# Patient Record
Sex: Female | Born: 1960 | Race: Black or African American | Hispanic: No | Marital: Single | Smoking: Never smoker
Health system: Southern US, Community
[De-identification: ages and names within clinical notes are randomized; demographics above are authoritative.]

## PROBLEM LIST (undated history)

## (undated) DIAGNOSIS — F329 Major depressive disorder, single episode, unspecified: Secondary | ICD-10-CM

## (undated) DIAGNOSIS — M545 Low back pain, unspecified: Secondary | ICD-10-CM

## (undated) DIAGNOSIS — R0602 Shortness of breath: Secondary | ICD-10-CM

## (undated) DIAGNOSIS — F32A Depression, unspecified: Secondary | ICD-10-CM

## (undated) DIAGNOSIS — I209 Angina pectoris, unspecified: Secondary | ICD-10-CM

## (undated) DIAGNOSIS — E7800 Pure hypercholesterolemia, unspecified: Secondary | ICD-10-CM

## (undated) DIAGNOSIS — R7303 Prediabetes: Secondary | ICD-10-CM

## (undated) DIAGNOSIS — R51 Headache: Secondary | ICD-10-CM

## (undated) DIAGNOSIS — G8929 Other chronic pain: Secondary | ICD-10-CM

## (undated) DIAGNOSIS — R4182 Altered mental status, unspecified: Secondary | ICD-10-CM

## (undated) DIAGNOSIS — M170 Bilateral primary osteoarthritis of knee: Secondary | ICD-10-CM

## (undated) DIAGNOSIS — E119 Type 2 diabetes mellitus without complications: Secondary | ICD-10-CM

## (undated) DIAGNOSIS — I10 Essential (primary) hypertension: Secondary | ICD-10-CM

## (undated) DIAGNOSIS — F419 Anxiety disorder, unspecified: Secondary | ICD-10-CM

## (undated) DIAGNOSIS — K7460 Unspecified cirrhosis of liver: Secondary | ICD-10-CM

## (undated) DIAGNOSIS — N179 Acute kidney failure, unspecified: Secondary | ICD-10-CM

## (undated) DIAGNOSIS — M1990 Unspecified osteoarthritis, unspecified site: Secondary | ICD-10-CM

## (undated) DIAGNOSIS — R0609 Other forms of dyspnea: Secondary | ICD-10-CM

## (undated) DIAGNOSIS — Z9981 Dependence on supplemental oxygen: Secondary | ICD-10-CM

## (undated) DIAGNOSIS — R000 Tachycardia, unspecified: Secondary | ICD-10-CM

## (undated) HISTORY — PX: JOINT REPLACEMENT: SHX530

## (undated) HISTORY — PX: TONSILLECTOMY: SUR1361

## (undated) HISTORY — DX: Tachycardia, unspecified: R00.0

---

## 1980-08-01 HISTORY — PX: TUBAL LIGATION: SHX77

## 2009-08-13 ENCOUNTER — Encounter: Admission: RE | Admit: 2009-08-13 | Discharge: 2009-08-13 | Payer: Self-pay | Admitting: Infectious Diseases

## 2009-08-17 ENCOUNTER — Encounter: Payer: Self-pay | Admitting: Family Medicine

## 2009-08-17 ENCOUNTER — Ambulatory Visit: Payer: Self-pay | Admitting: Family Medicine

## 2009-08-17 DIAGNOSIS — R350 Frequency of micturition: Secondary | ICD-10-CM

## 2009-08-17 DIAGNOSIS — J45909 Unspecified asthma, uncomplicated: Secondary | ICD-10-CM | POA: Insufficient documentation

## 2009-08-17 DIAGNOSIS — M549 Dorsalgia, unspecified: Secondary | ICD-10-CM | POA: Insufficient documentation

## 2009-08-17 LAB — CONVERTED CEMR LAB
Specific Gravity, Urine: 1.01
pH: 5.5

## 2009-08-24 ENCOUNTER — Ambulatory Visit: Payer: Self-pay | Admitting: Family Medicine

## 2009-08-24 DIAGNOSIS — M899 Disorder of bone, unspecified: Secondary | ICD-10-CM | POA: Insufficient documentation

## 2009-08-24 DIAGNOSIS — M5020 Other cervical disc displacement, unspecified cervical region: Secondary | ICD-10-CM

## 2009-08-24 DIAGNOSIS — M949 Disorder of cartilage, unspecified: Secondary | ICD-10-CM

## 2009-08-28 ENCOUNTER — Encounter: Payer: Self-pay | Admitting: *Deleted

## 2009-08-31 ENCOUNTER — Encounter: Payer: Self-pay | Admitting: Family Medicine

## 2009-08-31 ENCOUNTER — Ambulatory Visit: Payer: Self-pay | Admitting: Family Medicine

## 2009-08-31 DIAGNOSIS — I10 Essential (primary) hypertension: Secondary | ICD-10-CM

## 2009-08-31 DIAGNOSIS — M7660 Achilles tendinitis, unspecified leg: Secondary | ICD-10-CM | POA: Insufficient documentation

## 2009-08-31 LAB — CONVERTED CEMR LAB
BUN: 9 mg/dL (ref 6–23)
Calcium: 9.1 mg/dL (ref 8.4–10.5)
Creatinine, Ser: 0.51 mg/dL (ref 0.40–1.20)

## 2009-09-04 ENCOUNTER — Ambulatory Visit: Payer: Self-pay | Admitting: Family Medicine

## 2009-09-08 ENCOUNTER — Encounter: Payer: Self-pay | Admitting: Family Medicine

## 2009-09-08 ENCOUNTER — Encounter: Admission: RE | Admit: 2009-09-08 | Discharge: 2009-11-04 | Payer: Self-pay | Admitting: Family Medicine

## 2009-09-14 ENCOUNTER — Encounter: Payer: Self-pay | Admitting: Family Medicine

## 2009-09-14 ENCOUNTER — Ambulatory Visit: Payer: Self-pay | Admitting: Family Medicine

## 2009-09-14 LAB — CONVERTED CEMR LAB
BUN: 9 mg/dL (ref 6–23)
Chloride: 101 meq/L (ref 96–112)
Potassium: 4.1 meq/L (ref 3.5–5.3)
Sodium: 139 meq/L (ref 135–145)

## 2009-09-15 ENCOUNTER — Telehealth (INDEPENDENT_AMBULATORY_CARE_PROVIDER_SITE_OTHER): Payer: Self-pay | Admitting: *Deleted

## 2009-10-02 ENCOUNTER — Ambulatory Visit: Payer: Self-pay | Admitting: Family Medicine

## 2009-10-05 ENCOUNTER — Encounter: Admission: RE | Admit: 2009-10-05 | Discharge: 2009-11-04 | Payer: Self-pay | Admitting: Family Medicine

## 2009-10-13 ENCOUNTER — Ambulatory Visit: Payer: Self-pay | Admitting: Family Medicine

## 2009-10-13 DIAGNOSIS — M719 Bursopathy, unspecified: Secondary | ICD-10-CM

## 2009-10-13 DIAGNOSIS — M67919 Unspecified disorder of synovium and tendon, unspecified shoulder: Secondary | ICD-10-CM | POA: Insufficient documentation

## 2009-10-30 ENCOUNTER — Ambulatory Visit: Payer: Self-pay | Admitting: Family Medicine

## 2009-11-05 ENCOUNTER — Encounter: Admission: RE | Admit: 2009-11-05 | Discharge: 2009-12-05 | Payer: Self-pay | Admitting: Family Medicine

## 2009-12-09 ENCOUNTER — Ambulatory Visit (HOSPITAL_COMMUNITY): Admission: RE | Admit: 2009-12-09 | Discharge: 2009-12-09 | Payer: Self-pay | Admitting: Family Medicine

## 2009-12-09 ENCOUNTER — Ambulatory Visit: Payer: Self-pay | Admitting: Family Medicine

## 2009-12-09 DIAGNOSIS — N951 Menopausal and female climacteric states: Secondary | ICD-10-CM | POA: Insufficient documentation

## 2009-12-09 DIAGNOSIS — M25539 Pain in unspecified wrist: Secondary | ICD-10-CM | POA: Insufficient documentation

## 2009-12-09 DIAGNOSIS — R079 Chest pain, unspecified: Secondary | ICD-10-CM

## 2009-12-11 ENCOUNTER — Telehealth: Payer: Self-pay | Admitting: *Deleted

## 2009-12-22 ENCOUNTER — Encounter: Admission: RE | Admit: 2009-12-22 | Discharge: 2010-01-27 | Payer: Self-pay | Admitting: Family Medicine

## 2009-12-29 ENCOUNTER — Ambulatory Visit: Payer: Self-pay | Admitting: Family Medicine

## 2009-12-29 DIAGNOSIS — M7040 Prepatellar bursitis, unspecified knee: Secondary | ICD-10-CM | POA: Insufficient documentation

## 2009-12-31 ENCOUNTER — Encounter: Payer: Self-pay | Admitting: Family Medicine

## 2010-01-04 ENCOUNTER — Ambulatory Visit: Payer: Self-pay | Admitting: Family Medicine

## 2010-01-04 DIAGNOSIS — M25569 Pain in unspecified knee: Secondary | ICD-10-CM | POA: Insufficient documentation

## 2010-01-06 ENCOUNTER — Ambulatory Visit (HOSPITAL_COMMUNITY): Admission: RE | Admit: 2010-01-06 | Discharge: 2010-01-06 | Payer: Self-pay | Admitting: Family Medicine

## 2010-01-18 ENCOUNTER — Ambulatory Visit (HOSPITAL_COMMUNITY): Admission: RE | Admit: 2010-01-18 | Discharge: 2010-01-18 | Payer: Self-pay | Admitting: Family Medicine

## 2010-01-18 ENCOUNTER — Ambulatory Visit: Payer: Self-pay | Admitting: Family Medicine

## 2010-01-29 ENCOUNTER — Ambulatory Visit: Payer: Self-pay | Admitting: Family Medicine

## 2010-03-03 ENCOUNTER — Ambulatory Visit: Payer: Self-pay | Admitting: Obstetrics and Gynecology

## 2010-03-03 LAB — CONVERTED CEMR LAB: FSH: 53 milliintl units/mL

## 2010-03-05 ENCOUNTER — Ambulatory Visit: Payer: Self-pay | Admitting: Family Medicine

## 2010-03-05 DIAGNOSIS — M1710 Unilateral primary osteoarthritis, unspecified knee: Secondary | ICD-10-CM

## 2010-03-10 ENCOUNTER — Ambulatory Visit: Payer: Self-pay | Admitting: Obstetrics and Gynecology

## 2010-03-30 ENCOUNTER — Ambulatory Visit: Payer: Self-pay | Admitting: Family Medicine

## 2010-03-31 ENCOUNTER — Telehealth: Payer: Self-pay | Admitting: Family Medicine

## 2010-03-31 ENCOUNTER — Encounter: Payer: Self-pay | Admitting: Family Medicine

## 2010-04-06 ENCOUNTER — Telehealth (INDEPENDENT_AMBULATORY_CARE_PROVIDER_SITE_OTHER): Payer: Self-pay | Admitting: *Deleted

## 2010-04-06 ENCOUNTER — Encounter: Payer: Self-pay | Admitting: Family Medicine

## 2010-04-09 ENCOUNTER — Telehealth: Payer: Self-pay | Admitting: *Deleted

## 2010-04-15 ENCOUNTER — Encounter: Payer: Self-pay | Admitting: Family Medicine

## 2010-05-07 ENCOUNTER — Encounter: Payer: Self-pay | Admitting: Family Medicine

## 2010-05-19 ENCOUNTER — Encounter: Payer: Self-pay | Admitting: Family Medicine

## 2010-06-20 ENCOUNTER — Encounter: Payer: Self-pay | Admitting: Family Medicine

## 2010-06-21 ENCOUNTER — Telehealth: Payer: Self-pay | Admitting: Family Medicine

## 2010-06-29 ENCOUNTER — Encounter (INDEPENDENT_AMBULATORY_CARE_PROVIDER_SITE_OTHER): Payer: Self-pay | Admitting: *Deleted

## 2010-07-22 ENCOUNTER — Ambulatory Visit: Payer: Self-pay | Admitting: Family Medicine

## 2010-07-22 DIAGNOSIS — J309 Allergic rhinitis, unspecified: Secondary | ICD-10-CM | POA: Insufficient documentation

## 2010-08-01 HISTORY — PX: KNEE ARTHROSCOPY: SHX127

## 2010-08-06 ENCOUNTER — Ambulatory Visit
Admission: RE | Admit: 2010-08-06 | Discharge: 2010-08-06 | Payer: Self-pay | Source: Home / Self Care | Attending: Family Medicine | Admitting: Family Medicine

## 2010-08-06 ENCOUNTER — Encounter: Payer: Self-pay | Admitting: Family Medicine

## 2010-08-16 ENCOUNTER — Encounter
Admission: RE | Admit: 2010-08-16 | Discharge: 2010-08-16 | Payer: Self-pay | Source: Home / Self Care | Attending: Family Medicine | Admitting: Family Medicine

## 2010-08-19 ENCOUNTER — Telehealth: Payer: Self-pay | Admitting: *Deleted

## 2010-08-31 NOTE — Assessment & Plan Note (Signed)
Summary: pain in side,tcb   Vital Signs:  Patient profile:   50 year old female Height:      66.5 inches Weight:      281 pounds BMI:     44.84 BSA:     2.33 Temp:     98.1 degrees F Pulse rate:   64 / minute BP sitting:   124 / 85  Vitals Entered By: Jone Baseman CMA (Dec 09, 2009 1:35 PM) CC: pain multiple sites Is Patient Diabetic? No Pain Assessment Patient in pain? yes     Location: all over Intensity: 10   Primary Care Provider:  Ellery Plunk MD  CC:  pain multiple sites.  History of Present Illness: shoulder-  pain significantly improved after injection.    side pain and back pain- chronic since work related injury moving her pt around.  never been imaged.  denies red flags- loss of B or B, any numbness or tingling, any weakness or atrophy.    wrist pain- FOOSH injury 1 month ago.  still hurts, especially over thumb.    post-menapausal bleeding- no periods in 2 years, never had hot flashes before.  now, on 4/4 had full period.  now having hot flashes.  Habits & Providers  Alcohol-Tobacco-Diet     Tobacco Status: never  Current Medications (verified): 1)  Proair Hfa 108 (90 Base) Mcg/act Aers (Albuterol Sulfate) .... Take 2 Puffs Each 4 Hours As Needed For Wheezing 2)  Flonase 50 Mcg/act Susp (Fluticasone Propionate) .... Two Sprays in Each Nostril Once Per Day. 3)  Hydrochlorothiazide 25 Mg Tabs (Hydrochlorothiazide) .... Take One Daily 4)  Diclofenac Sodium 75 Mg Tbec (Diclofenac Sodium) .Marland Kitchen.. 1 By Mouth Bid 5)  Nortriptyline Hcl 25 Mg Caps (Nortriptyline Hcl) .... Take One Tablet At Bedtime 6)  Tramadol Hcl 50 Mg Tabs (Tramadol Hcl) .Marland Kitchen.. 1 By Mouth Bidprn Pain 7)  Vicodin 5-500 Mg Tabs (Hydrocodone-Acetaminophen) .... Take One At Night  Allergies (verified): No Known Drug Allergies  Review of Systems  The patient denies anorexia, fever, weight loss, weight gain, vision loss, decreased hearing, hoarseness, chest pain, syncope, dyspnea on exertion,  peripheral edema, prolonged cough, headaches, hemoptysis, abdominal pain, melena, hematochezia, severe indigestion/heartburn, hematuria, incontinence, genital sores, muscle weakness, suspicious skin lesions, transient blindness, difficulty walking, depression, unusual weight change, abnormal bleeding, enlarged lymph nodes, angioedema, breast masses, and testicular masses.    Physical Exam  General:  overweight-appearing.   Lungs:  Normal respiratory effort, chest expands symmetrically. Lungs are clear to auscultation, no crackles or wheezes. Heart:  Normal rate and regular rhythm. S1 and S2 normal without gallop, murmur, click, rub or other extra sounds. Msk:  full ROM of back, pain worst with leaning back though complains of pulling when leaning forward.  no pain to palpation along spine.    wrist with full ROM pain worst in anatomic snuffbox and posterior thumb   Impression & Recommendations:  Problem # 1:  RIB PAIN, LEFT SIDED (ICD-786.50) Assessment Unchanged likely related to muscle strain secondary to back injury.  Think that PT would be helpful for back pain, better posture. Orders: Physical Therapy Referral (PT) [facility]- Est  Level 4 (16109)  Problem # 2:  BACK PAIN, CHRONIC (ICD-724.5) likely related to muscle strain secondary to back injury.  Think that PT would be helpful for back pain, better posture.  L and S spine films to evaluate for bony abnormalities.  Her updated medication list for this problem includes:    Diclofenac Sodium 75 Mg  Tbec (Diclofenac sodium) .Marland Kitchen... 1 by mouth bid    Tramadol Hcl 50 Mg Tabs (Tramadol hcl) .Marland Kitchen... 1 by mouth bidprn pain    Vicodin 5-500 Mg Tabs (Hydrocodone-acetaminophen) .Marland Kitchen... Take one at night  Orders: Physical Therapy Referral (PT) [facility]- Est  Level 4 (19147) Diagnostic X-Ray/Fluoroscopy (Diagnostic X-Ray/Flu)  Problem # 3:  WRIST PAIN, RIGHT (WGN-562.13) Assessment: New do not think this is likely to be a fx but will get wrist film to  r/o.  think this is more likely to be a tendonitis.  will use thumb splint to imobilize.  likely 6 weeks with splint. ? PT after that? Orders: Physical Therapy Referral (PT) [facility]- Est  Level 4 (99214) [facility] [phone]) Diagnostic X-Ray/Fluoroscopy (Diagnostic X-Ray/Flu)  Problem # 4:  POSTMENOPAUSAL BLEEDING (ICD-627.1) Assessment: New need to evaluate for endometrial ca.  will have pt return for endometrial biopsy.  advised to take 600 of motrin before visit. Orders: [facility]- Est  Level 4 (99214)  Complete Medication List: 1)  Proair Hfa 108 (90 Base) Mcg/act Aers (Albuterol sulfate) .... Take 2 puffs each 4 hours as needed for wheezing 2)  Flonase 50 Mcg/act Susp (Fluticasone propionate) .... Two sprays in each nostril once per day. 3)  Hydrochlorothiazide 25 Mg Tabs (Hydrochlorothiazide) .... Take one daily 4)  Diclofenac Sodium 75 Mg Tbec (Diclofenac sodium) .Marland Kitchen.. 1 by mouth bid 5)  Nortriptyline Hcl 25 Mg Caps (Nortriptyline hcl) .... Take one tablet at bedtime 6)  Tramadol Hcl 50 Mg Tabs (Tramadol hcl) .Marland Kitchen.. 1 by mouth bidprn pain 7)  Vicodin 5-500 Mg Tabs (Hydrocodone-acetaminophen) .... Take one at night  Patient Instructions: 1)  I am glad you are doing better!!!   2)  Please come back to see me for an endometrial biopsy.  That is all we will be able to do that day, but I will be happy to see you at any time to help with any other problem.  Take 800mg  of advil 30 min before your appt to help with cramping. 3)  Go for xrays for your wrist and back.

## 2010-08-31 NOTE — Progress Notes (Signed)
Summary: meds prob  Phone Note Call from Patient Call back at Home Phone [phone]   Caller: Patient Summary of Call: pt has stated that the pharm has sent in requests for her BP meds- pt is now out and needs this am. Initial call taken by: De Nurse,  April 09, 2010 9:31 AM  Follow-up for Phone Call        MD sts that she faxed the paper rx request back this am.  Pt informed. Follow-up by: Jone Baseman CMA,  April 09, 2010 10:49 AM

## 2010-08-31 NOTE — Miscellaneous (Signed)
Summary: Orders Update  Clinical Lists Changes  Orders: Added new Service order of Cervical Foam Collar Non Adjustable (Z6109) - Signed

## 2010-08-31 NOTE — Miscellaneous (Signed)
Summary: new pt info  Clinical Lists Changes   Problems: Added new problem of DIABETES MELLITUS, TYPE II (ICD-250.00) Added new problem of ASTHMA, UNSPECIFIED, UNSPECIFIED STATUS (ICD-493.90) Added new problem of BACK PAIN, CHRONIC (ICD-724.5) Observations: Added new observation of FAMILY HX: Diabetes in parents and sisters (08/17/2009 8:48) Added new observation of SOCIAL HX: lives with daughter Ivor Costa and Doniel and with a Friend named Economist.  Has a dog.  Doesn't endorse a healthy diet, unemployed.  completed college. divorced.  heterosexual.  Nonsmoker.  Drinks one beer/day.  Denies any exercise. (08/17/2009 8:48) Added new observation of PAST SURG HX: BTL in 1982 (08/17/2009 8:48) Added new observation of PAST MED HX: chronic back pain, asthma (08/17/2009 8:48)     Past History:  Past Medical History: chronic back pain, asthma  Past Surgical History: BTL in 1982   Family History: Diabetes in parents and sisters  Social History: lives with daughter Ivor Costa and Doniel and with a Friend named Economist.  Has a dog.  Doesn't endorse a healthy diet, unemployed.  completed college. divorced.  heterosexual.  Nonsmoker.  Drinks one beer/day.  Denies any exercise.  Appended Document: new pt info        Appended Document: new pt info additional phone number for pt is [phone]

## 2010-08-31 NOTE — Assessment & Plan Note (Signed)
Summary: ENDO BX PER DR. Cyle Kenyon/[facility]   Vital Signs:  Patient profile:   50 year old female Height:      66.5 inches Weight:      278.1 pounds BMI:     44.37 Temp:     98.5 degrees F oral Pulse rate:   125 / minute BP sitting:   116 / 82  (left arm) Cuff size:   large  Vitals Entered By: Gladstone Pih (Dec 29, 2009 1:55 PM) CC: Endo BX Is Patient Diabetic? No Pain Assessment Patient in pain? no        Primary Care Provider:  Ellery Plunk MD  CC:  Endo BX.  History of Present Illness: postmenapausal bleeding- see previous note  knee pain- lasting several weeks, due to injury while dancing  wrist pain-  helped a little by the thumb splint, not much.  would like [place] to stop her PT for a while  endometrial biopsy- sterile speculum used to locate cervix, betadine swabs x 3 around cervix, tinaculum placed on anterior lip with little bleeding.  Sound was advanced but did not pass through cervix due to stenosis.  procedure stopped.  Habits & Providers  Alcohol-Tobacco-Diet     Tobacco Status: never  Current Medications (verified): 1)  Proair Hfa 108 (90 Base) Mcg/act Aers (Albuterol Sulfate) .... Take 2 Puffs Each 4 Hours As Needed For Wheezing 2)  Flonase 50 Mcg/act Susp (Fluticasone Propionate) .... Two Sprays in Each Nostril Once Per Day. 3)  Hydrochlorothiazide 25 Mg Tabs (Hydrochlorothiazide) .... Take One Daily 4)  Diclofenac Sodium 75 Mg Tbec (Diclofenac Sodium) .Marland Kitchen.. 1 By Mouth Bid 5)  Nortriptyline Hcl 25 Mg Caps (Nortriptyline Hcl) .... Take One Tablet At Bedtime 6)  Tramadol Hcl 50 Mg Tabs (Tramadol Hcl) .Marland Kitchen.. 1 By Mouth Bidprn Pain 7)  Vicodin 5-500 Mg Tabs (Hydrocodone-Acetaminophen) .... Take One At Night  Allergies (verified): No Known Drug Allergies  Review of Systems  The patient denies anorexia, fever, weight loss, weight gain, vision loss, decreased hearing, hoarseness, chest pain, syncope, dyspnea on exertion, peripheral edema, prolonged cough,  headaches, hemoptysis, abdominal pain, melena, hematochezia, severe indigestion/heartburn, hematuria, incontinence, genital sores, muscle weakness, suspicious skin lesions, transient blindness, difficulty walking, depression, unusual weight change, abnormal bleeding, enlarged lymph nodes, angioedema, breast masses, and testicular masses.    Physical Exam  General:  Well-developed,well-nourished,in no acute distress; alert,appropriate and cooperative throughout examination Genitalia:  cervical os stenotic.  unable to pass sounds Msk:  prepatellar bursa swollen on injured knee and very tender.   Impression & Recommendations:  Problem # 1:  POSTMENOPAUSAL BLEEDING (ICD-627.1) Assessment Unchanged unable to preform biopsy due to stenotic cervix.  will order [place] and send to gyn clinic for dilitation and biopsy. Orders: Ultrasound (Ultrasound) Endometrial/Endocerv BX- [facility] ([id number]) [facility]- Est Level  3 (08657)  Problem # 2:  WRIST PAIN, RIGHT (ICD-719.43) still having wrist pain.  xray showed small chip.  discussed with Dr. Jennette Kettle who thought an injection might help.  Will hold off PT for now until a little better.  switch to a long brace to see if any improvement. Orders: Brace, wrist- [facility] ([id number]) [facility]- Est Level  3 (29528)  Problem # 3:  PREPATELLAR BURSITIS (ICD-726.65) Assessment: New injected bursa today with lidocaine and kenolog.  hopefully with improve.   Orders: Injection, large joint- [facility] (20610) [facility]- Est Level  3 (41324)  Complete Medication List: 1)  Proair Hfa 108 (90 Base) Mcg/act Aers (Albuterol sulfate) .... Take 2 puffs each 4  hours as needed for wheezing 2)  Flonase 50 Mcg/act Susp (Fluticasone propionate) .... Two sprays in each nostril once per day. 3)  Hydrochlorothiazide 25 Mg Tabs (Hydrochlorothiazide) .... Take one daily 4)  Diclofenac Sodium 75 Mg Tbec (Diclofenac sodium) .Marland Kitchen.. 1 by mouth bid 5)  Nortriptyline Hcl 25 Mg Caps (Nortriptyline hcl) .... Take one tablet at  bedtime 6)  Tramadol Hcl 50 Mg Tabs (Tramadol hcl) .Marland Kitchen.. 1 by mouth bidprn pain 7)  Vicodin 5-500 Mg Tabs (Hydrocodone-acetaminophen) .... Take one at night  Other Orders: Gynecologic Referral (Gyn)  Patient Instructions: 1)  please go to sports medicine clinc on friday with Dr. Jennette Kettle 2)  use the long arm brace to see if that helps 3)  go to your gynecology appt to evaluate your bleeding 4)  have fun at the wedding

## 2010-08-31 NOTE — Progress Notes (Signed)
  Phone Note Refill Request Message from:  Fax from Pharmacy  Refills Requested: Medication #1:  HYDROCODONE-ACETAMINOPHEN 10-325 MG TABS 2 by mouth q 6 hrs as needed pain. Refilled Vicodin with no refils   Method Requested: Fax to Local Pharmacy Initial call taken by: Clementeen Graham MD,  June 21, 2010 2:39 PM

## 2010-08-31 NOTE — Assessment & Plan Note (Signed)
Summary: f/u,df   Vital Signs:  Patient profile:   50 year old female Height:      66.5 inches Weight:      274.1 pounds BMI:     43.74 Temp:     97.8 degrees F oral Pulse rate:   93 / minute BP sitting:   157 / 87  (left arm) Cuff size:   regular  Vitals Entered By: Garen Grams LPN (August 31, 2009 8:41 AM) CC: f/u pain Is Patient Diabetic? No Pain Assessment Patient in pain? yes     Location: left side/rt shoulder   Primary Care Provider:  Ellery Plunk MD  CC:  f/u pain.  History of Present Illness: heel pain-started 4 months ago.  Worse in morning and gets better throughout day.  Describes as pain and stiffness.  Denies injury to area, denies weakness or numbness.  Has tried icing twice with no effect.  HTN- has been noted to be hypertensive on last two visits.  denies blurry vision, HA, CP.  Has never been on a antihypertensive.    Habits & Providers  Alcohol-Tobacco-Diet     Tobacco Status: never  Current Medications (verified): 1)  Gabapentin 800 Mg Tabs (Gabapentin) .... Take One Three Times Per Day 2)  Proair Hfa 108 (90 Base) Mcg/act Aers (Albuterol Sulfate) .... Take 2 Puffs Each 4 Hours As Needed For Wheezing 3)  Amitriptyline Hcl 25 Mg Tabs (Amitriptyline Hcl) .... Take One At Bedtime As Needed 4)  Flonase 50 Mcg/act Susp (Fluticasone Propionate) .... Two Sprays in Each Nostril Once Per Day. 5)  Allegra 180 Mg Tabs (Fexofenadine Hcl) .... Take One Daily 6)  Hydrochlorothiazide 25 Mg Tabs (Hydrochlorothiazide) .... Take One Daily  Allergies (verified): No Known Drug Allergies  Review of Systems  The patient denies anorexia, fever, vision loss, hoarseness, chest pain, dyspnea on exertion, and prolonged cough.    Physical Exam  General:  alert and underweight appearing.   Lungs:  Normal respiratory effort, chest expands symmetrically. Lungs are clear to auscultation, no crackles or wheezes. Heart:  Normal rate and regular rhythm. S1 and S2 normal  without gallop, murmur, click, rub or other extra sounds. Extremities:  edema to mid shin bilaterally-1+   Foot/Ankle Exam  Gait:    antalgic.    Skin:    Intact with no scars, lesions, rashes, cafe-au-lait spots or bruising.    Inspection:    Inspection reveals no deformity, ecchymosis or swelling.   Palpation:    tenderness on Left heel at the insertion on the achilles tendon.  no tenderness in calf or along tendon  Sensory:    gross sensation intact bilaterally in lower extremities.    Motor:    Motor strength 5/5 bilaterally for ankle dorsiflexion, ankle plantar flexion, ankle inversion and ankle eversion.    Reflexes:    Normal and symmetric Achilles reflexes bilaterally.    Ankle Exam:    Right:    Inspection:  Normal    Left:    Palpation:  Abnormal       Location:  achilles tendon insertion    Stability:  stable    Tenderness:  yes    Swelling:  no    Erythema:  no    full ROM   Impression & Recommendations:  Problem # 1:  ACHILLES BURSITIS OR TENDINITIS (ICD-726.71) Assessment New I think that her pain sounds like tendinitis so I will refer to Dr. Jennette Kettle at sports medicine for evaluation and treatment.  Asked patient  to continue ice in the interim.   Orders: [facility]- Est Level  3 (16109)  Problem # 2:  ESSENTIAL HYPERTENSION, BENIGN (ICD-401.1) Assessment: New Discussed diet and exercise modifications.  will continue to reenforce with pt at each visit.  starting HCTZ, which will hopefully help swelling as well.  Checking BMET today.  nurse visit and lab draw in 2 weeks.  follow up with me in clinic 3 months Her updated medication list for this problem includes:    Hydrochlorothiazide 25 Mg Tabs (Hydrochlorothiazide) .Marland Kitchen... Take one daily  Orders: Basic Met-[facility] [phone]) [facility]- Est Level  3 (99213)Future Orders: Basic Met-[facility] ([id number]) ... 08/12/2010  Complete Medication List: 1)  Gabapentin 800 Mg Tabs (Gabapentin) .... Take one three times per  day 2)  Proair Hfa 108 (90 Base) Mcg/act Aers (Albuterol sulfate) .... Take 2 puffs each 4 hours as needed for wheezing 3)  Amitriptyline Hcl 25 Mg Tabs (Amitriptyline hcl) .... Take one at bedtime as needed 4)  Flonase 50 Mcg/act Susp (Fluticasone propionate) .... Two sprays in each nostril once per day. 5)  Allegra 180 Mg Tabs (Fexofenadine hcl) .... Take one daily 6)  Hydrochlorothiazide 25 Mg Tabs (Hydrochlorothiazide) .... Take one daily  Patient Instructions: 1)  Please make an appt for sports medicine with Dr. Jennette Kettle to look at your foot 2)  Please make a nurse appt to get your blood pressure checked in 2 weeks and to have blood work done. 3)  Make an appt to see me in 3 months 4)  You decided to go to the gym at least once per week. 5)  increase your fruit and veggies to 5/ day and try to reduce portions and limit packaged snack foods. 6)  We started a medicine called HCTZ for your blood pressure today.  This should also help your swelling. Prescriptions: HYDROCHLOROTHIAZIDE 25 MG TABS (HYDROCHLOROTHIAZIDE) take one daily  #30 x 3   Entered and Authorized by:   Ellery Plunk MD   Signed by:   Ellery Plunk MD on 08/31/2009   Method used:   Faxed to ...       [facility] (retail)       [address] [place], [place]  [address]       Ph: [phone]       Fax: [phone]   RxID:   [id number]

## 2010-08-31 NOTE — Letter (Signed)
Summary: Disability Determination Services  Disability Determination Services   Imported By: Marily Memos 04/19/2010 13:52:26  _____________________________________________________________________  External Attachment:    Type:   Image     Comment:   External Document

## 2010-08-31 NOTE — Assessment & Plan Note (Signed)
Summary: F/U FEET,[facility]   Vital Signs:  Patient profile:   50 year old female BP sitting:   123 / 82  Vitals Entered By: Lillia Pauls CMA (October 30, 2009 8:43 AM)  Primary Care Provider:  Ellery Plunk MD   History of Present Illness: f/u B achilles tendon problems--85% better. She is completing her Pt today. has been wearing the heel lift in her left shoe only--her Pt augmented that with an additional small lift.  Allergies: No Known Drug Allergies  Physical Exam  Msk:  B achilles intact. mild pain with forced dorsiflexion left achilles and mildly tender to palpation at insertion but less tthan previously.   Impression & Recommendations:  Problem # 1:  ACHILLES BURSITIS OR TENDINITIS (ICD-726.71) Assessment Improved would continue heel lift. rtc as needed good improvement  Complete Medication List: 1)  Proair Hfa 108 (90 Base) Mcg/act Aers (Albuterol sulfate) .... Take 2 puffs each 4 hours as needed for wheezing 2)  Flonase 50 Mcg/act Susp (Fluticasone propionate) .... Two sprays in each nostril once per day. 3)  Hydrochlorothiazide 25 Mg Tabs (Hydrochlorothiazide) .... Take one daily 4)  Diclofenac Sodium 75 Mg Tbec (Diclofenac sodium) .Marland Kitchen.. 1 by mouth bid 5)  Nortriptyline Hcl 25 Mg Caps (Nortriptyline hcl) .... Take one tablet at bedtime 6)  Tramadol Hcl 50 Mg Tabs (Tramadol hcl) .Marland Kitchen.. 1 by mouth bidprn pain 7)  Vicodin 5-500 Mg Tabs (Hydrocodone-acetaminophen) .... Take one at night

## 2010-08-31 NOTE — Miscellaneous (Signed)
Summary: Procedure Consent  Procedure Consent   Imported By: Bradly Bienenstock 12/31/2009 12:06:17  _____________________________________________________________________  External Attachment:    Type:   Image     Comment:   External Document

## 2010-08-31 NOTE — Assessment & Plan Note (Signed)
Summary: F/U,[facility]   Vital Signs:  Patient profile:   50 year old female BP sitting:   126 / 81  Vitals Entered By: Lillia Pauls CMA (October 02, 2009 8:45 AM)  Primary Care Provider:  Ellery Plunk MD  CC:  f/u achilles.  History of Present Illness: Pt f/u on achilles tendonitis/bursitis.  She was unable to get the nitro-patches due to insurance issues.  Been doing eccentrics daily.  No relief yet, in fact she feels a little worse.  She now also experiences a burning pain along the left achilles and heel.  She is concerned that she hasn't had any improvement. Also wants more pain relief.  Problems Prior to Update: 1)  Achilles Bursitis or Tendinitis  (ICD-726.71) 2)  Essential Hypertension, Benign  (ICD-401.1) 3)  Herniated Cervical Disc  (ICD-722.0) 4)  Osteopenia  (ICD-733.90) 5)  Physical Examination  (ICD-V70.0) 6)  Screening For Malignant Neoplasm of The Cervix  (ICD-V76.2) 7)  Urinary Frequency  (ICD-788.41) 8)  Back Pain, Chronic  (ICD-724.5) 9)  Asthma, Unspecified, Unspecified Status  (ICD-493.90)  Medications Prior to Update: 1)  Proair Hfa 108 (90 Base) Mcg/act Aers (Albuterol Sulfate) .... Take 2 Puffs Each 4 Hours As Needed For Wheezing 2)  Flonase 50 Mcg/act Susp (Fluticasone Propionate) .... Two Sprays in Each Nostril Once Per Day. 3)  Hydrochlorothiazide 25 Mg Tabs (Hydrochlorothiazide) .... Take One Daily 4)  Nitro-Dur 0.2 Mg/hr Pt24 (Nitroglycerin) .... Cut Patch Into Quarters and Apply A New Quarter Patch To Heel Area Every Morning 5)  Diclofenac Sodium 75 Mg Tbec (Diclofenac Sodium) .Marland Kitchen.. 1 By Mouth Bid 6)  Nortriptyline Hcl 25 Mg Caps (Nortriptyline Hcl) .... Take One Tablet At Bedtime  Allergies: No Known Drug Allergies  Past History:  Past Medical History: Last updated: 08/17/2009 chronic back pain, asthma, borderline blood sugar, borderline blood pressure, obesity, perenial allergies-singulair and nasonex didnt help  [id number]  Review of  Systems MS:  Complains of joint pain and stiffness; denies muscle weakness. Neuro:  Denies numbness and tingling.  Physical Exam  General:  obese female, NAD Msk:  left foot/ankle: TTP along achilles and down to the heel, very tender Mild swelling around the tendon Thompson's test shows achilles intact no ankle instability or tenderness strength 5/5 throughout but some pain with plantar and dorsiflexion Pulses:  good distal pulses Neurologic:  sensation intact   Impression & Recommendations:  Problem # 1:  ACHILLES BURSITIS OR TENDINITIS (ICD-726.71)  Pt was unable to obtain nitro patches Added heel wedges to shoes today Continue exercises, ice heel and back of leg afterwards Tramadol two times a day for pain Continue diclofenac Will try some iontophoresis at PT return in 4-5 weeks for re-eval  Complete Medication List: 1)  Proair Hfa 108 (90 Base) Mcg/act Aers (Albuterol sulfate) .... Take 2 puffs each 4 hours as needed for wheezing 2)  Flonase 50 Mcg/act Susp (Fluticasone propionate) .... Two sprays in each nostril once per day. 3)  Hydrochlorothiazide 25 Mg Tabs (Hydrochlorothiazide) .... Take one daily 4)  Diclofenac Sodium 75 Mg Tbec (Diclofenac sodium) .Marland Kitchen.. 1 by mouth bid 5)  Nortriptyline Hcl 25 Mg Caps (Nortriptyline hcl) .... Take one tablet at bedtime 6)  Tramadol Hcl 50 Mg Tabs (Tramadol hcl) .Marland Kitchen.. 1 by mouth bidprn pain Prescriptions: TRAMADOL HCL 50 MG TABS (TRAMADOL HCL) 1 by mouth bidprn pain  #60 x 2   Entered and Authorized by:   Denny Levy MD   Signed by:   Denny Levy MD  on 10/02/2009   Method used:   Handwritten   RxID:   [id number]

## 2010-08-31 NOTE — Miscellaneous (Signed)
Summary: Medical record request  Clinical Lists Changes  Rec'd medical record request to go to Disability det services date sent: 05/24/10 Marily Memos  June 29, 2010 12:22 PM

## 2010-08-31 NOTE — Progress Notes (Signed)
Summary: Therapy  Phone Note Call from Patient Call back at Home Phone [phone]   Reason for Call: Talk to Nurse Summary of Call: pt needs orders faxed to physical therapist, our records indicated [facility] outpatient rehab, pt sts the office address is [address]. Pt needs this done asap Initial call taken by: Knox Royalty,  Dec 11, 2009 2:13 PM  Follow-up for Phone Call        it has been re faxed to [facility] PT. pt informed. Follow-up by: Golden Circle RN,  Dec 11, 2009 2:17 PM

## 2010-08-31 NOTE — Miscellaneous (Signed)
Summary: ROI  ROI   Imported By: Colleen Olson 08/19/2009 15:19:20  _____________________________________________________________________  External Attachment:    Type:   Image     Comment:   External Document

## 2010-08-31 NOTE — Progress Notes (Signed)
Summary: Rx Ques  Phone Note Call from Patient Call back at [facility] Phone [phone]   Caller: Patient Summary of Call: Checking on status of order for cane. Initial call taken by: Clydell Hakim,  April 06, 2010 10:06 AM  Follow-up for Phone Call        patient notified that RX is ready to pick up. Follow-up by: Theresia Lo RN,  April 06, 2010 11:46 AM

## 2010-08-31 NOTE — Assessment & Plan Note (Signed)
Summary: F/U,MC   Vital Signs:  Patient profile:   50 year old female BP sitting:   127 / 87  Vitals Entered By: Lillia Pauls CMA (January 18, 2010 10:03 AM)  Primary Care Provider:  Ellery Plunk MD   History of Present Illness: 1? f/u right wrist pain---totally resolved. Feels normal  2) f/u left knee pain--was better for about a week then started hurting again. Same ppain as before--now at 6-7/10. non radiating. Worse with walking or standing.  no new symptoms  Allergies: No Known Drug Allergies  Review of Systems       see hPI  Physical Exam  General:  overweight-appearing.   Msk:  Left kee tender medial joint line, no effusion, no erythema. calf is soft. distally neurovascualrlyintact   Impression & Recommendations:  Problem # 1:  KNEE PAIN, LEFT (ICD-719.46) Assessment Unchanged  Orders: Radiology other (Radiology Other) I suspect her arthritis is pretty advanced. will get standing films-_  continue kknee strengthening program and Will go from there once we see the films rtc 2 w  Problem # 2:  WRIST PAIN, RIGHT (ICD-719.43) Assessment: Improved resolved. I thinkshe can stop wearing the brace. continue exercises at least twice a week.  Complete Medication List: 1)  Proair Hfa 108 (90 Base) Mcg/act Aers (Albuterol sulfate) .... Take 2 puffs each 4 hours as needed for wheezing 2)  Flonase 50 Mcg/act Susp (Fluticasone propionate) .... Two sprays in each nostril once per day. 3)  Hydrochlorothiazide 25 Mg Tabs (Hydrochlorothiazide) .... Take one daily 4)  Diclofenac Sodium 75 Mg Tbec (Diclofenac sodium) .Marland Kitchen.. 1 by mouth bid 5)  Nortriptyline Hcl 25 Mg Caps (Nortriptyline hcl) .... Take one tablet at bedtime 6)  Tramadol Hcl 50 Mg Tabs (Tramadol hcl) .Marland Kitchen.. 1 by mouth bidprn pain 7)  Vicodin 5-500 Mg Tabs (Hydrocodone-acetaminophen) .... Take one at night

## 2010-08-31 NOTE — Assessment & Plan Note (Signed)
Summary: Colleen Olson,df   Vital Signs:  Patient profile:   50 year old female Height:      66.5 inches Weight:      281 pounds BMI:     44.84 BSA:     2.33 Temp:     98.7 degrees F Pulse rate:   109 / minute BP sitting:   135 / 87  Vitals Entered By: Jone Baseman CMA (October 13, 2009 2:34 PM) CC: Colleen Olson Is Patient Diabetic? No Pain Assessment Patient in pain? yes     Location: right Olson Intensity: 10   Primary Care Provider:  Ellery Plunk MD  CC:  Colleen Olson.  History of Present Illness: Pt with complaint of Olson pain.  Has had PT for thought to be neck referred Olson pain.  states that this helped her neck and not her Olson.  has weakness in arm and pain with or without movt, though worse with movt.  taking diclofenac for achilles tendonitis and not helping pain much.  Habits & Providers  Alcohol-Tobacco-Diet     Tobacco Status: never  Current Medications (verified): 1)  Proair Hfa 108 (90 Base) Mcg/act Aers (Albuterol Sulfate) .... Take 2 Puffs Each 4 Hours As Needed For Wheezing 2)  Flonase 50 Mcg/act Susp (Fluticasone Propionate) .... Two Sprays in Each Nostril Once Per Day. 3)  Hydrochlorothiazide 25 Mg Tabs (Hydrochlorothiazide) .... Take One Daily 4)  Diclofenac Sodium 75 Mg Tbec (Diclofenac Sodium) .Marland Kitchen.. 1 By Mouth Bid 5)  Nortriptyline Hcl 25 Mg Caps (Nortriptyline Hcl) .... Take One Tablet At Bedtime 6)  Tramadol Hcl 50 Mg Tabs (Tramadol Hcl) .Marland Kitchen.. 1 By Mouth Bidprn Pain 7)  Vicodin 5-500 Mg Tabs (Hydrocodone-Acetaminophen) .... Take One At Night  Allergies (verified): No Known Drug Allergies  Review of Systems  The patient denies syncope, dyspnea on exertion, peripheral edema, prolonged cough, headaches, abdominal pain, and severe indigestion/heartburn.    Physical Exam  General:  overweight-appearing.   Msk:  R Olson is limited in active ROM, however, full passive ROM with pain.  Pt has pain with abduction and external  rotation.  most tender point is lateral 2 cm below humeral head.   Impression & Recommendations:  Problem # 1:  ROTATOR CUFF SYNDROME, RIGHT (ICD-726.10) Assessment Unchanged THink that this is a rotator cuff syndrome rather than refered from cervical disk dz.  Joint injection of 3ml lidocaine, 1ml kenolog had good response.  Pt felt immediate relief, though partial, and continued to have improvement for the next 15 min.  instructed pt to let us know how long relief lasts.  Would consider 3-4 injections/year if good relief.  also, sent for PT for Olson to try to add strength and conditioning.  Gave prescription for vicodin to take at night when pain is worst.  told her would not refill earlier than one month.  discussed reasons to limit narcotics.  Orders: Injection, large joint- [facility] (20610) [facility]- Est  Level 4 (14782) Physical Therapy Referral (PT)  Complete Medication List: 1)  Proair Hfa 108 (90 Base) Mcg/act Aers (Albuterol sulfate) .... Take 2 puffs each 4 hours as needed for wheezing 2)  Flonase 50 Mcg/act Susp (Fluticasone propionate) .... Two sprays in each nostril once per day. 3)  Hydrochlorothiazide 25 Mg Tabs (Hydrochlorothiazide) .... Take one daily 4)  Diclofenac Sodium 75 Mg Tbec (Diclofenac sodium) .Marland Kitchen.. 1 by mouth bid 5)  Nortriptyline Hcl 25 Mg Caps (Nortriptyline hcl) .... Take one tablet at bedtime 6)  Tramadol Hcl 50  Mg Tabs (Tramadol hcl) .Marland Kitchen.. 1 by mouth bidprn pain 7)  Vicodin 5-500 Mg Tabs (Hydrocodone-acetaminophen) .... Take one at night  Patient Instructions: 1)  Today we injected a steroid and a numbing medication into your Olson.  I expect this to kick in in about 12 hours.  This should last for a few months.   2)  I have written an order for PT for your Olson. 3)  I have written a prescription for vicodin, one per night.  I will not be refilling this for one month, so make sure you don't lose these.  Tylenol should help during the  day. Prescriptions: VICODIN 5-500 MG TABS (HYDROCODONE-ACETAMINOPHEN) take one at night  #30 x 0   Entered by:   Jone Baseman CMA   Authorized by:   Ellery Plunk MD   Signed by:   Jone Baseman CMA on 10/13/2009   Method used:   Print then Give to Patient   RxID:   [id number] VICODIN 5-500 MG TABS (HYDROCODONE-ACETAMINOPHEN) take one at night  #30 x 0   Entered and Authorized by:   Ellery Plunk MD   Signed by:   Ellery Plunk MD on 10/13/2009   Method used:   Print then Give to Patient   RxID:   [id number]

## 2010-08-31 NOTE — Progress Notes (Signed)
Summary: [facility]  [facility]   Imported By: Marily Memos 07/01/2010 15:00:08  _____________________________________________________________________  External Attachment:    Type:   Image     Comment:   External Document

## 2010-08-31 NOTE — Letter (Signed)
Summary: Disability Determination Services  Disability Determination Services   Imported By: Marily Memos 04/27/2010 15:00:46  _____________________________________________________________________  External Attachment:    Type:   Image     Comment:   External Document

## 2010-08-31 NOTE — Assessment & Plan Note (Signed)
Summary: knee pain,tcb   Vital Signs:  Patient profile:   50 year old female Height:      66.5 inches Weight:      276.8 pounds BMI:     44.17 Temp:     98.0 degrees F oral Pulse rate:   95 / minute BP sitting:   123 / 85  (left arm) Cuff size:   regular  Vitals Entered By: Garen Grams LPN (March 30, 2010 10:11 AM) CC: f/u knee pain Is Patient Diabetic? No Pain Assessment Patient in pain? yes     Location: left knee   Primary Care Provider:  Ellery Plunk MD  CC:  f/u knee pain.  History of Present Illness: knee pain-  seen by Dr. Jennette Kettle.  she recommends to send to ortho for eval for TKR.  not using cane but complaining that knee will give out and she will fall.  unable to work, applying for disability.    vaginal bleeding- saw Dr. Okey Dupre in [facility].  he does not think she is quite in menapause yet and therefore didnt do a biopsy.  he will follow up in 6 months.  Habits & Providers  Alcohol-Tobacco-Diet     Tobacco Status: never  Current Medications (verified): 1)  Proair Hfa 108 (90 Base) Mcg/act Aers (Albuterol Sulfate) .... Take 2 Puffs Each 4 Hours As Needed For Wheezing 2)  Flonase 50 Mcg/act Susp (Fluticasone Propionate) .... Two Sprays in Each Nostril Once Per Day. 3)  Hydrochlorothiazide 25 Mg Tabs (Hydrochlorothiazide) .... Take One Daily 4)  Diclofenac Sodium 75 Mg Tbec (Diclofenac Sodium) .Marland Kitchen.. 1 By Mouth Bid 5)  Nortriptyline Hcl 25 Mg Caps (Nortriptyline Hcl) .... Take One Tablet At Bedtime 6)  Tramadol Hcl 50 Mg Tabs (Tramadol Hcl) .Marland Kitchen.. 1 By Mouth Bidprn Pain 7)  Hydrocodone-Acetaminophen 10-325 Mg Tabs (Hydrocodone-Acetaminophen) .... 2 By Mouth Q 6 Hrs As Needed Pain  Allergies (verified): No Known Drug Allergies  Review of Systems       The patient complains of difficulty walking.  The patient denies anorexia, fever, and chest pain.    Physical Exam  General:  VS reviewed.  obese.  Well-developed,well-nourished,in no acute distress; alert,appropriate  and cooperative throughout examination Lungs:  Normal respiratory effort, chest expands symmetrically. Lungs are clear to auscultation, no crackles or wheezes. Heart:  Normal rate and regular rhythm. S1 and S2 normal without gallop, murmur, click, rub or other extra sounds. Abdomen:  Bowel sounds positive,abdomen soft and non-tender without masses, organomegaly or hernias noted. Msk:  both knees with crepitus.  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  to ortho in [facility] for eval for TKR Her updated medication list for this problem includes:    Diclofenac Sodium 75 Mg Tbec (Diclofenac sodium) .Marland Kitchen... 1 by mouth bid    Tramadol Hcl 50 Mg Tabs (Tramadol hcl) .Marland Kitchen... 1 by mouth bidprn pain    Hydrocodone-acetaminophen 10-325 Mg Tabs (Hydrocodone-acetaminophen) .Marland Kitchen... 2 by mouth q 6 hrs as needed pain  Orders: Orthopedic Surgeon Referral (Ortho Surgeon) [facility]- Est Level  3 (45409)  Problem # 2:  POSTMENOPAUSAL BLEEDING (ICD-627.1) Assessment: Improved Dr. Okey Dupre does not think this is postmenapausal and he will follow this.  will ask for his notes Orders: [facility]- Est Level  3 (99213)  Complete Medication List: 1)  Proair Hfa 108 (90 Base) Mcg/act Aers (Albuterol sulfate) .... Take 2  puffs each 4 hours as needed for wheezing 2)  Flonase 50 Mcg/act Susp (Fluticasone propionate) .... Two sprays in each nostril once per day. 3)  Hydrochlorothiazide 25 Mg Tabs (Hydrochlorothiazide) .... Take one daily 4)  Diclofenac Sodium 75 Mg Tbec (Diclofenac sodium) .Marland Kitchen.. 1 by mouth bid 5)  Nortriptyline Hcl 25 Mg Caps (Nortriptyline hcl) .... Take one tablet at bedtime 6)  Tramadol Hcl 50 Mg Tabs (Tramadol hcl) .Marland Kitchen.. 1 by mouth bidprn pain 7)  Hydrocodone-acetaminophen 10-325 Mg Tabs (Hydrocodone-acetaminophen) .... 2 by mouth q 6 hrs as needed pain  Other Orders: Physical  Therapy Referral (PT)  Patient Instructions: 1)  we will call you with the orthopedic appt 2)  go to PT for help getting a cane 3)  for your sweats, try staying in a cool place, run a fan at night, dress in layers, and try your best to lose a little weight. Prescriptions: HYDROCODONE-ACETAMINOPHEN 10-325 MG TABS (HYDROCODONE-ACETAMINOPHEN) 2 by mouth q 6 hrs as needed pain  #240 x 0   Entered and Authorized by:   Ellery Plunk MD   Signed by:   Ellery Plunk MD on 03/30/2010   Method used:   Print then Give to Patient   RxID:   [id number]

## 2010-08-31 NOTE — Assessment & Plan Note (Signed)
Summary: F/U,MC   Vital Signs:  Patient profile:   50 year old female BP sitting:   117 / 78  Vitals Entered By: Lillia Pauls CMA (January 29, 2010 10:20 AM)  Primary Care Provider:  Ellery Plunk MD   History of Present Illness: f/u left knee---the injection helped some, but seems to be wearing off. Had x rays done. Has a lot of activity coming up wit the holiday and wonders about her options for her knee. f/u r wrist--totally resolved.  Allergies: No Known Drug Allergies  Physical Exam  General:  alert, well-developed, well-nourished, and well-hydrated.   Msk:  B knees external changes of OA. Left knee medial joint line tenderness, noe ffusion or swelling. No erythema. flexion normal, lacks fll extension by 5-10 degrees, calf is soft diistally neurovascularly inatact Additional Exam:  reviewed Xrays--B medial comparment severe OA with left knee essentially bone on bone,   Patient given informed consent for injection. Discussed possible complications of infection, bleeding or skin atrophy at site of injection. Possible side effect of avascular necrosis (focal area of bone death) due to steroid use.Appropriate verbal time out taken Are cleaned and prepped in usual sterile fashion. A ---1- cc kennalog plus -4---cc 1% lidocaine without epinephrine was injected into the--left knee-. Patient tolerated procedure well with no complications.    Impression & Recommendations:  Problem # 1:  KNEE PAIN, LEFT (ICD-719.46)  B severe DJD /OA but worse on left with severe medial compartment loss of joint space. We agreed to try a second injection to see iof we can get control of her pain. She is already working on weight loss. will start her on chronic pain meds as she is not insured and cannot refer to ortho.  f/u 3 m  Orders: Joint Aspirate / Injection, Large (20610) Kenalog 10 mg inj (J3301)  Complete Medication List: 1)  Proair Hfa 108 (90 Base) Mcg/act Aers (Albuterol sulfate) ....  Take 2 puffs each 4 hours as needed for wheezing 2)  Flonase 50 Mcg/act Susp (Fluticasone propionate) .... Two sprays in each nostril once per day. 3)  Hydrochlorothiazide 25 Mg Tabs (Hydrochlorothiazide) .... Take one daily 4)  Diclofenac Sodium 75 Mg Tbec (Diclofenac sodium) .Marland Kitchen.. 1 by mouth bid 5)  Nortriptyline Hcl 25 Mg Caps (Nortriptyline hcl) .... Take one tablet at bedtime 6)  Tramadol Hcl 50 Mg Tabs (Tramadol hcl) .Marland Kitchen.. 1 by mouth bidprn pain 7)  Vicodin Hp 10-660 Mg Tabs (Hydrocodone-acetaminophen) .Marland Kitchen.. 1 by mouth q 6 hrs as needed pain Prescriptions: VICODIN HP 10-660 MG TABS (HYDROCODONE-ACETAMINOPHEN) 1 by mouth q 4-6 hrs as needed pain  #120 x 5   Entered and Authorized by:   Denny Levy MD   Signed by:   Denny Levy MD on 01/29/2010   Method used:   Print then Give to Patient   RxID:   [id number] VICODIN HP 10-660 MG TABS (HYDROCODONE-ACETAMINOPHEN) 1 by mouth q 4-6 hrs as needed pain  #12o x 5   Entered and Authorized by:   Denny Levy MD   Signed by:   Denny Levy MD on 01/29/2010   Method used:   Telephoned to ...       [facility]  [address]  [phone]* (retail)       [address]       [place], [place]  [address]       Ph: [phone] or [phone]       Fax: [phone]  RxID:   [id number]

## 2010-08-31 NOTE — Assessment & Plan Note (Signed)
Summary: FU PER NEAL/MJD   Vital Signs:  Patient profile:   50 year old female BP sitting:   120 / 84  Vitals Entered By: Lillia Pauls CMA (March 05, 2010 9:46 AM)  Primary Care Emalynn Clewis:  Ellery Plunk MD   History of Present Illness: 50 yo obese F f/u knee DJD.  CSI 2 months ago in L knee, lasted only 1 month.  CSI last month that lasted only 5 days. Still having lots of pain in anterior and medial knee. Using vicodin 5-325 2 tabs q 6 hours that helps a little bit, but only lasts a couple hours.  Wants stronger dose. Has applied for disability for L knee. Not using a cane or knee brace. 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 Has [employer], so unable to get surgical services.  Allergies: No Known Drug Allergies   Knee Exam  General:    obese.    Knee Exam:    Right:    Inspection/Palpation:  mild TTP medial joint line.    Left:    Inspection/Palpation:  TTP medial joint line.  Minimal effusion.  + crepitus.   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 No insurance so for now we willtreat with vicodin. I will return her to care of Dr Spiegel--recommend vicodin long term until we can get different surgical option--would consider longer acting narcotic but doubt availability. I will be happy to see her any time.  Complete Medication List: 1)  Proair Hfa 108 (90 Base) Mcg/act Aers (Albuterol sulfate) .... Take 2 puffs each 4 hours as needed for wheezing 2)  Flonase 50 Mcg/act Susp (Fluticasone propionate) .... Two sprays in each nostril once per day. 3)  Hydrochlorothiazide 25 Mg Tabs (Hydrochlorothiazide) .... Take one daily 4)  Diclofenac Sodium 75 Mg Tbec (Diclofenac sodium) .Marland Kitchen.. 1 by mouth bid 5)  Nortriptyline Hcl 25 Mg Caps (Nortriptyline hcl) .... Take one tablet at bedtime 6)  Tramadol Hcl 50 Mg Tabs (Tramadol hcl) .Marland Kitchen.. 1 by  mouth bidprn pain 7)  Hydrocodone-acetaminophen 10-325 Mg Tabs (Hydrocodone-acetaminophen) .Marland Kitchen.. 1 by mouth q 6 hrs as needed pain Prescriptions: HYDROCODONE-ACETAMINOPHEN 10-325 MG TABS (HYDROCODONE-ACETAMINOPHEN) 1 by mouth q 6 hrs as needed pain  #124 x 0   Entered and Authorized by:   Denny Levy MD   Signed by:   Denny Levy MD on 03/05/2010   Method used:   Historical   RxID:   [id number] VICODIN HP 10-660 MG TABS (HYDROCODONE-ACETAMINOPHEN) 1 by mouth q 6 hrs as needed pain  #124 x 0   Entered and Authorized by:   Denny Levy MD   Signed by:   Denny Levy MD on 03/05/2010   Method used:   Print then Give to Patient   RxID:   [id number]

## 2010-08-31 NOTE — Progress Notes (Signed)
Summary: phnmsg  Phone Note Call from Patient Call back at Home Phone [phone]   Caller: Patient Summary of Call: pt is calling back to talk to [facility] Initial call taken by: De Nurse,  September 15, 2009 10:01 AM  Follow-up for Phone Call        paged Dr.  Hulen Luster about message from yesterday. she will call [facility] pharmacy to see what she can prescribe that they may have. Follow-up by: Theresia Lo RN,  September 15, 2009 10:11 AM  Additional Follow-up for Phone Call Additional follow up Details #1::        spoke with patient and gave message from MD . see note from 09/14/2009. Additional Follow-up by: Theresia Lo RN,  September 15, 2009 10:43 AM

## 2010-08-31 NOTE — Assessment & Plan Note (Signed)
Summary: BP CHECK/KH  Nurse Visit patient in office for BP check today. BP checked manually with large adult cuff. BP LA 130/80, pulse 88. patient states pain has eased in left foot but swelling has gone down only a little bit according to patient. Also patient went to [facility] to get meds prescribed by Dr. Hulen Luster, the gabapentin and Allerga. They did not have either med and patient would like for MD to prescribe something else for pain and allergy that they have. [facility] pharmacy # is [phone]. will send message to MD. will call patient when this has been done . Phone # [phone] . Kristene Liberati RN  September 14, 2009 11:20 AM   called pharmacy and left message.  reviewed chart, gabapentin was a med that she came to me on.  Would have pt use the diclofinac that Dr. Jennette Kettle prescribed for pain at this time.  Will also prescribe nortryptilline 25mg  daily qhs for nerve pain.  If patient notices dry mouth, urinary  retention, please call. Will d/c amitryptiline.  for allergies, alegra is only rx.  can try to get claritin OTC or use benedryl at bedtime.  if she wants, we can resend the script for allegra to a pharmacy with a 4 dollar list.  Allergies: No Known Drug Allergies  Orders Added: 1)  No Charge Patient Arrived (NCPA0) [NCPA0] Prescriptions: NORTRIPTYLINE HCL 25 MG CAPS (NORTRIPTYLINE HCL) take one tablet at bedtime  #30 x 3   Entered and Authorized by:   Ellery Plunk MD   Signed by:   Ellery Plunk MD on 09/15/2009   Method used:   Faxed to ...       [facility] (retail)       [address]       [place], [place]  [address]       Ph: [phone]       Fax: [phone]   RxID:   [id number]   Appended Document: BP CHECK/KH patient notified.

## 2010-08-31 NOTE — Assessment & Plan Note (Signed)
Summary: L FOOT PAIN X 3-4 MOS   Vital Signs:  Patient profile:   50 year old female BP sitting:   139 / 92  Vitals Entered By: Lillia Pauls CMA (September 04, 2009 8:52 AM)  Primary Care Provider:  Ellery Plunk MD   History of Present Illness: Several weeks of left posterior ankle pain. No known injury  no prior hx of problems or surgeries on ankle  Allergies: No Known Drug Allergies  Physical Exam  General:  overweight-appearing.   Additional Exam:  B ankle anterior drawer intact. normal eversion and inversion. neurovascularly intact. B severe pes planu. Gait is antalgic with short stride length on left.  Left achilles is without defect, tender at insertion. US exam reveals some calcifications at insertion as well as generalized tendon thickening aat 1.13 cm.   Impression & Recommendations:  Problem # 1:  ACHILLES BURSITIS OR TENDINITIS (ICD-726.71)  achilles tendinitis will tx w NSAID, eccentric exercises and nitro patch rtc 1 m  Orders: US EXTREMITY NON-VASC REAL-TIME IMG (16109)  Complete Medication List: 1)  Gabapentin 800 Mg Tabs (Gabapentin) .... Take one three times per day 2)  Proair Hfa 108 (90 Base) Mcg/act Aers (Albuterol sulfate) .... Take 2 puffs each 4 hours as needed for wheezing 3)  Amitriptyline Hcl 25 Mg Tabs (Amitriptyline hcl) .... Take one at bedtime as needed 4)  Flonase 50 Mcg/act Susp (Fluticasone propionate) .... Two sprays in each nostril once per day. 5)  Allegra 180 Mg Tabs (Fexofenadine hcl) .... Take one daily 6)  Hydrochlorothiazide 25 Mg Tabs (Hydrochlorothiazide) .... Take one daily 7)  Nitro-dur 0.2 Mg/hr Pt24 (Nitroglycerin) .... Cut patch into quarters and apply a new quarter patch to heel area every morning 8)  Diclofenac Sodium 75 Mg Tbec (Diclofenac sodium) .Marland Kitchen.. 1 by mouth bid  Patient Instructions: 1)  Dothe exercises I showed you Di twent exercises at a time, three times a day. 2)  Ihave faxed a prescription for the nitro  patch and the diclofenac (ibuprofen like medicine) to [facility]. 3)  Please see me in a month. Prescriptions: DICLOFENAC SODIUM 75 MG TBEC (DICLOFENAC SODIUM) 1 by mouth bid  #60 x 1   Entered and Authorized by:   Denny Levy MD   Signed by:   Denny Levy MD on 09/04/2009   Method used:   Printed then faxed to ...         RxID:   [id number] NITRO-DUR 0.2 MG/HR PT24 (NITROGLYCERIN) cut patch into quarters and apply a new quarter patch to heel area every morning  #8 x 1   Entered and Authorized by:   Denny Levy MD   Signed by:   Denny Levy MD on 09/04/2009   Method used:   Printed then faxed to ...         RxID:   [id number]

## 2010-08-31 NOTE — Miscellaneous (Signed)
  Clinical Lists Changes  Problems: Changed problem from ASTHMA, UNSPECIFIED, UNSPECIFIED STATUS (ICD-493.90) to ASTHMA, INTERMITTENT (ICD-493.90) 

## 2010-08-31 NOTE — Progress Notes (Signed)
Summary: needs cane  Phone Note Call from Patient Call back at Home Phone [phone]   Caller: Patient Summary of Call: needs to talk to nurse about getting a cane - PT doesn't have any - needs to know where to get one pt has no money Initial call taken by: De Nurse,  March 31, 2010 10:20 AM  Follow-up for Phone Call        pt spoke with Chauncey Reading and she was told to have the practice contact [facility] and they will accept the orange card.  Physician have to order from them. Follow-up by: Abundio Miu  Additional Follow-up for Phone Call Additional follow up Details #1::        will forward message to MD to put in order for cane and then will let patient take to [facility]. Additional Follow-up by: Theresia Lo RN,  March 31, 2010 11:04 AM    Additional Follow-up for Phone Call Additional follow up Details #2::    not sure how to order but put under misc charge.  let me know if i can do it another way Follow-up by: Ellery Plunk MD,  April 05, 2010 10:50 PM

## 2010-08-31 NOTE — Consult Note (Signed)
Summary: [facility] PT  [facility] PT   Imported By: De Nurse 10/07/2009 13:55:45  _____________________________________________________________________  External Attachment:    Type:   Image     Comment:   External Document

## 2010-08-31 NOTE — Letter (Signed)
Summary: External Other  External Other   Imported By: Marily Memos 05/19/2010 13:26:23  _____________________________________________________________________  External Attachment:    Type:   Image     Comment:   External Document
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
## 2010-08-31 NOTE — Miscellaneous (Signed)
  Clinical Lists Changes  Orders: Added new Service order of EMR Electrical engineer Code [facility]) - Signed

## 2010-08-31 NOTE — Assessment & Plan Note (Signed)
Summary: FU/KH   Vital Signs:  Patient profile:   50 year old female Height:      66.5 inches Weight:      275.6 pounds BMI:     43.97 Temp:     98.2 degrees F oral Pulse rate:   114 / minute BP sitting:   147 / 88  (left arm) Cuff size:   regular  Vitals Entered By: Garen Grams LPN (August 24, 2009 11:19 AM) CC: rt shoulder Is Patient Diabetic? No Pain Assessment Patient in pain? yes     Location: see above   Primary Care Provider:  Hulen Luster  CC:  rt shoulder.  History of Present Illness: pt presents for eval of R shoulder pain.  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 She hurt her back lifting the woman and subsequently hurt her shoulder trying to compensate.  She denies any falls or trauma.  THis pain feels like a shock, needles, and throbbing.  It hurts all the time, nothing makes it better that she has tried.    Habits & Providers  Alcohol-Tobacco-Diet     Tobacco Status: never  Current Medications (verified): 1)  Gabapentin 800 Mg Tabs (Gabapentin) .... Take One Three Times Per Day 2)  Proair Hfa 108 (90 Base) Mcg/act Aers (Albuterol Sulfate) .... Take 2 Puffs Each 4 Hours As Needed For Wheezing 3)  Amitriptyline Hcl 25 Mg Tabs (Amitriptyline Hcl) .... Take One At Bedtime As Needed 4)  Flonase 50 Mcg/act Susp (Fluticasone Propionate) .... Two Sprays in Each Nostril Once Per Day. 5)  Allegra 180 Mg Tabs (Fexofenadine Hcl) .... Take One Daily 6)  Metronidazole 500 Mg Tabs (Metronidazole) .... Take 2 Pills Two Times A Day For One Day  Allergies (verified): No Known Drug Allergies  Review of Systems  The patient denies anorexia, fever, chest pain, syncope, and hemoptysis.    Physical Exam  General:  Well-developed,well-nourished,in no acute distress; alert,appropriate and cooperative throughout examination.  overweight-appearing.     Shoulder/Elbow Exam  Skin:    Intact, no scars, lesions, rashes, cafe au lait  spots or bruising.    Inspection:    Inspection is normal.    Palpation:    diffusely tender but hurts worst over the head of the humerus  Sensory:    Gross sensation intact in the upper extremities.    Motor:    Normal strength in the upper extremities.    Reflexes:    Normal reflexes in the upper extremities.    Impingement Sign NEER:    Right negative; Left negative  compression test was positive.  Pt has full ROM of motion but is limited by pain.     Impression & Recommendations:  Problem # 1:  HERNIATED CERVICAL DISC (ICD-722.0) Assessment Unchanged believe her shoulder pain to be caused by a herniated cervical disk in the neck.  She describes pain that soulnd neurologic,  she has a positive3 compression test, and full ROM.  Also considered frozen shoulder (adhesive capsulitis) since she is diabetic or an impingement syndrome.  Felt cervical disk disease most likely.  sending for PT, giving soft cervical collar.  can use motrin and heat.   Orders: Cervical collar- [facility] (Q4696) Physical Therapy Referral (PT) [facility]- Est Level  3 (29528)  Complete Medication List: 1)  Gabapentin 800 Mg Tabs (Gabapentin) .... Take one three times per day 2)  Proair Hfa 108 (90 Base) Mcg/act Aers (Albuterol sulfate) .Marland KitchenMarland KitchenMarland Kitchen  Take 2 puffs each 4 hours as needed for wheezing 3)  Amitriptyline Hcl 25 Mg Tabs (Amitriptyline hcl) .... Take one at bedtime as needed 4)  Flonase 50 Mcg/act Susp (Fluticasone propionate) .... Two sprays in each nostril once per day. 5)  Allegra 180 Mg Tabs (Fexofenadine hcl) .... Take one daily 6)  Metronidazole 500 Mg Tabs (Metronidazole) .... Take 2 pills two times a day for one day  Patient Instructions: 1)  Please make a follow up to discuss your other pains. 2)  Wear your soft collar while you are awake.  If you are waking up with shoulder pain, you can also wear it at night. 3)  If you continue to have pain, you can take advil as directed on the bottle. 4)  We will  call you with information about the physical therapy. 5)  For your Ca and Vit D, there is not a supplement available at the health dept.  If you cannot find this at the dollar store, you can take a multivitamin with your ca supplement.

## 2010-08-31 NOTE — Assessment & Plan Note (Signed)
Summary: WRIST/KH   Vital Signs:  Patient profile:   50 year old female BP sitting:   147 / 100  Vitals Entered By: Lillia Pauls CMA (January 04, 2010 8:40 AM)  Primary Care Provider:  Ellery Plunk MD   History of Present Illness: DATE of INJURY (approximate): 12/04/2009 Larey Seat off step onto right hand. Had shoulder and wrist pain. Shoulder pain resolved with injection therapy. Wrist continues to hurt with any movement. Has been wearing brace with some relief  Also left knee hurts for several months, prior to fall and worsening. Pain with standing, walking--cannot squat. Pain 9/10.  Aches. No radiation. No giving way.  Allergies: No Known Drug Allergies  Physical Exam  General:  morbidly obese. Msk:  R wrist FROm but pain with extension, very positive finklesteins test. Pain w resisted thumb ABduction.    [place]: thumb tendons have some edema around them chip fracture seen on x ray is visualized, no increase in vascularity   Knee--left--TTP medial and lateral joint line tender. No effusion. CaLF IS SOFT. Additional Exam:  Patient given informed consent for injection. Discussed possible complications of infection, bleeding or skin atrophy at site of injection. Possible side effect of avascular necrosis (focal area of bone death) due to steroid use.Appropriate verbal time out taken Are cleaned and prepped in usual sterile fashion. A ---1- cc kennalog plus ----c4c 1% lidocaine without epinephrine was injected into the-left knee--. Patient tolerated procedure well with no complications. Patient given informed consent for injection. Discussed possible complications of infection, bleeding or skin atrophy at site of injection. Possible side effect of avascular necrosis (focal area of bone death) due to steroid use.Appropriate verbal time out taken Are cleaned and prepped in usual sterile fashion. A --1/2-- cc kennalog plus --1/2--cc 1% lidocaine without epinephrine was injected into the-thumb tendon  right-. Patient tolerated procedure well with no complications.     Impression & Recommendations:  Problem # 1:  WRIST PAIN, RIGHT (ICD-719.43)  c/w Berline Lopes. injection. continue cock up as that seems to help some. rtc 2 w  Orders: [place] LIMITED (16109) Joint Aspirate / Injection, Intermediate (60454) Kenalog 10 mg inj (U9811)  Problem # 2:  KNEE PAIN, LEFT (ICD-719.46)  djd with possible degenerative meniscal tear. we will try in jection today and rtc 2 w  Orders: Joint Aspirate / Injection, Large (20610) Kenalog 10 mg inj (J3301)  Complete Medication List: 1)  Proair Hfa 108 (90 Base) Mcg/act Aers (Albuterol sulfate) .... Take 2 puffs each 4 hours as needed for wheezing 2)  Flonase 50 Mcg/act Susp (Fluticasone propionate) .... Two sprays in each nostril once per day. 3)  Hydrochlorothiazide 25 Mg Tabs (Hydrochlorothiazide) .... Take one daily 4)  Diclofenac Sodium 75 Mg Tbec (Diclofenac sodium) .Marland Kitchen.. 1 by mouth bid 5)  Nortriptyline Hcl 25 Mg Caps (Nortriptyline hcl) .... Take one tablet at bedtime 6)  Tramadol Hcl 50 Mg Tabs (Tramadol hcl) .Marland Kitchen.. 1 by mouth bidprn pain 7)  Vicodin 5-500 Mg Tabs (Hydrocodone-acetaminophen) .... Take one at night

## 2010-09-02 NOTE — Letter (Signed)
Summary: ROI  ROI   Imported By: Marily Memos 08/06/2010 11:06:09  _____________________________________________________________________  External Attachment:    Type:   Image     Comment:   External Document

## 2010-09-02 NOTE — Assessment & Plan Note (Signed)
Summary: left knee pain,mc   Vital Signs:  Patient profile:   50 year old female Pulse rate:   99 / minute BP sitting:   121 / 83  (right arm)  Vitals Entered By: Rochele Pages RN (August 06, 2010 9:52 AM) CC: f/u L knee pain   Primary Care Provider:  Ellery Plunk MD  CC:  f/u L knee pain.  History of Present Illness: had some episodes of leg "giving away".  Using cane, which helps her catch herself.  tramadol two times a day, diclofenac two times a day and vicodin 2 tabs q6 hours.  Feeling much better on this regimen.  Still having pain but not as sharp.    Couldn't do PT because of pain.    [facility] Nov 8th said that she needed TKR but b/c no payer would not do insurance.  MRI showed that knee was too bad for othroscopic procedure.  Injection only helped x 1 month.    Lost 2 lbs.    Habits & Providers  Alcohol-Tobacco-Diet     Tobacco Status: never  Current Medications (verified): 1)  Proair Hfa 108 (90 Base) Mcg/act Aers (Albuterol Sulfate) .... Take 2 Puffs Each 4 Hours As Needed For Wheezing 2)  Hydrochlorothiazide 25 Mg Tabs (Hydrochlorothiazide) .... Take One Daily 3)  Diclofenac Sodium 75 Mg Tbec (Diclofenac Sodium) .Marland Kitchen.. 1 By Mouth Bid 4)  Nortriptyline Hcl 25 Mg Caps (Nortriptyline Hcl) .... Take One Tablet At Bedtime 5)  Tramadol Hcl 50 Mg Tabs (Tramadol Hcl) .Marland Kitchen.. 1 By Mouth Bidprn Pain 6)  Hydrocodone-Acetaminophen 10-325 Mg Tabs (Hydrocodone-Acetaminophen) .... 2 By Mouth Q 6 Hrs As Needed Pain 7)  Claritin 10 Mg Tabs (Loratadine) .... Take One Every Day 8)  Nasonex 50 Mcg/act Susp (Mometasone Furoate) .... Two Sprays in Each Nostril Daily  Allergies (verified): No Known Drug Allergies  Review of Systems  The patient denies vision loss, chest pain, syncope, and fever.    Physical Exam  General:  NAD, obese Msk:  both knees with crepitus.  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  Will refer to [facility] for TKR as [facility] did not qualify her financially.  No further joint injections.  Gave short arc exercises.  ROI for MRI from [facility] which we will send to [facility].  Pt should follow up at [facility] Her updated medication list for this problem includes:    Diclofenac Sodium 75 Mg Tbec (Diclofenac sodium) .Marland Kitchen... 1 by mouth bid    Tramadol Hcl 50 Mg Tabs (Tramadol hcl) .Marland Kitchen... 1 by mouth bidprn pain    Hydrocodone-acetaminophen 10-325 Mg Tabs (Hydrocodone-acetaminophen) .Marland Kitchen... 2 by mouth q 6 hrs as needed pain  Orders: Orthopedic Surgeon Referral (Ortho Surgeon)  Complete Medication List: 1)  Proair Hfa 108 (90 Base) Mcg/act Aers (Albuterol sulfate) .... Take 2 puffs each 4 hours as needed for wheezing 2)  Hydrochlorothiazide 25 Mg Tabs (Hydrochlorothiazide) .... Take one daily 3)  Diclofenac Sodium 75 Mg Tbec (Diclofenac sodium) .Marland Kitchen.. 1 by mouth bid 4)  Nortriptyline Hcl 25 Mg Caps (Nortriptyline hcl) .... Take one tablet at bedtime 5)  Tramadol Hcl 50 Mg Tabs (Tramadol hcl) .Marland Kitchen.. 1 by mouth bidprn pain 6)  Hydrocodone-acetaminophen 10-325 Mg Tabs (Hydrocodone-acetaminophen) .... 2 by mouth q 6 hrs as needed pain 7)  Claritin 10 Mg Tabs (Loratadine) .... Take one every day 8)  Nasonex 50 Mcg/act Susp (Mometasone furoate) .... Two sprays in each nostril daily   Orders Added: 1)  Orthopedic Surgeon Referral [Ortho Surgeon] 2)  Est. Patient Level III [16109]

## 2010-09-02 NOTE — Assessment & Plan Note (Signed)
Summary: f/u,df   FLU SHOT GIVEN TODAY.Jimmy Footman, CMA  July 22, 2010 4:26 PM  Vital Signs:  Patient profile:   50 year old female Weight:      275 pounds Temp:     98 degrees F oral Pulse rate:   62 / minute Pulse rhythm:   regular BP sitting:   146 / 77  (left arm) Cuff size:   regular  Vitals Entered By: Loralee Pacas CMA (July 22, 2010 3:42 PM) CC: follow-up visit   Primary Care Provider:  Ellery Plunk MD  CC:  follow-up visit.  History of Present Illness: knee pain- seen by [facility], unable to get knee surgery due to insurance.  Felt that MD was rude, does not want to return.  WOuld like to follow up with Dr. Jennette Kettle whom she trusts.  Received steroid injection at [facility] that only lasted one month.  Still having lots of pain.  Thinks that her vicodin help but wants to use other medications as well in between vicodin doses.  allergic rhinitis- describes symptoms as a 2 month cold with sneezing, runny nose and post nasal drip.  no fevers.  has been on claritin in the past which helped.  also was on a nasal steroid but was unable to get flonase.   weight- workign on weight loss. lost 2 lbs.  watching portions.  Current Medications (verified): 1)  Proair Hfa 108 (90 Base) Mcg/act Aers (Albuterol Sulfate) .... Take 2 Puffs Each 4 Hours As Needed For Wheezing 2)  Hydrochlorothiazide 25 Mg Tabs (Hydrochlorothiazide) .... Take One Daily 3)  Diclofenac Sodium 75 Mg Tbec (Diclofenac Sodium) .Marland Kitchen.. 1 By Mouth Bid 4)  Nortriptyline Hcl 25 Mg Caps (Nortriptyline Hcl) .... Take One Tablet At Bedtime 5)  Tramadol Hcl 50 Mg Tabs (Tramadol Hcl) .Marland Kitchen.. 1 By Mouth Bidprn Pain 6)  Hydrocodone-Acetaminophen 10-325 Mg Tabs (Hydrocodone-Acetaminophen) .... 2 By Mouth Q 6 Hrs As Needed Pain 7)  Claritin 10 Mg Tabs (Loratadine) .... Take One Every Day 8)  Nasonex 50 Mcg/act Susp (Mometasone Furoate) .... Two Sprays in Each Nostril Daily  Allergies (verified): No Known Drug Allergies  Past  History:  Social History: Last updated: 08/17/2009 lives with daughter Ivor Costa and Doniel and with a Friend/ previous significant other named Economist.  Has a dog.  Doesn't endorse a healthy diet, unemployed.  completed college. divorced.  heterosexual. not currently in a sexual relationship.   Nonsmoker.  Drinks one 40 oz beer/every other day.  Had problems with ETOH.  Denies any exercise.  Review of Systems  The patient denies anorexia, fever, weight loss, syncope, and dyspnea on exertion.    Physical Exam  General:  NAD vs reviewed. obese Eyes:  vision grossly intact, pupils equal, pupils round, and pupils reactive to light.   Ears:  R ear normal and L ear normal.   Nose:  mucosal edema.   Mouth:  fair dentition and teeth missing.   Lungs:  Normal respiratory effort, chest expands symmetrically. Lungs are clear to auscultation, no crackles or wheezes. Heart:  Normal rate and regular rhythm. S1 and S2 normal without gallop, murmur, click, rub or other extra sounds. Abdomen:  soft and non-tender.     Impression & Recommendations:  Problem # 1:  ALLERGIC RHINITIS (ICD-477.9) Assessment Unchanged chronic allergies.  restart claritin and try to get nasonex The following medications were removed from the medication list:    Flonase 50 Mcg/act Susp (Fluticasone propionate) .Marland Kitchen..Marland Kitchen Two sprays in each  nostril once per day. Her updated medication list for this problem includes:    Claritin 10 Mg Tabs (Loratadine) .Marland Kitchen... 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  will  likely need TKR if able to get that with her insurance.  Pt would like to follow with Dr. Jennette Kettle. Her updated medication list for this problem includes:    Diclofenac Sodium 75 Mg Tbec (Diclofenac sodium) .Marland Kitchen... 1 by mouth bid    Tramadol Hcl 50 Mg Tabs (Tramadol hcl) .Marland Kitchen... 1 by mouth bidprn pain    Hydrocodone-acetaminophen 10-325 Mg Tabs (Hydrocodone-acetaminophen) .Marland Kitchen... 2 by mouth q 6 hrs as needed pain  Orders: [facility]- Est  Level 4 (99214)  Complete Medication List: 1)  Proair Hfa 108 (90 Base) Mcg/act Aers (Albuterol sulfate) .... Take 2 puffs each 4 hours as needed for wheezing 2)  Hydrochlorothiazide 25 Mg Tabs (Hydrochlorothiazide) .... Take one daily 3)  Diclofenac Sodium 75 Mg Tbec (Diclofenac sodium) .Marland Kitchen.. 1 by mouth bid 4)  Nortriptyline Hcl 25 Mg Caps (Nortriptyline hcl) .... Take one tablet at bedtime 5)  Tramadol Hcl 50 Mg Tabs (Tramadol hcl) .Marland Kitchen.. 1 by mouth bidprn pain 6)  Hydrocodone-acetaminophen 10-325 Mg Tabs (Hydrocodone-acetaminophen) .... 2 by mouth q 6 hrs as needed pain 7)  Claritin 10 Mg Tabs (Loratadine) .... Take one every day 8)  Nasonex 50 Mcg/act Susp (Mometasone furoate) .... Two sprays in each nostril daily  Other Orders: Flu Vaccine 12yrs + (09811) Admin 1st Vaccine (91478)  Patient Instructions: 1)  Happy Holidays! 2)  we refilled your diclofenac, tramadol, and vicodin. These are all to be used for your pain.  It can be easy to get confused and take too many.  Be very careful to take the prescribed amount.   3)  We also [facility]x [facility] 4)  please make an appt with Dr. Jennette Kettle to go over your visit with [facility]. Prescriptions: HYDROCODONE-ACETAMINOPHEN 10-325 MG TABS (HYDROCODONE-ACETAMINOPHEN) 2 by mouth q 6 hrs as needed pain  #240 x 0   Entered and Authorized by:   Ellery Plunk MD   Signed by:   Ellery Plunk MD on 07/22/2010   Method used:   Print then Give to Patient   RxID:   [id number] NASONEX 50 MCG/ACT SUSP  (MOMETASONE FUROATE) two sprays in each nostril daily  #1 x 5   Entered and Authorized by:   Ellery Plunk MD   Signed by:   Ellery Plunk MD on 07/22/2010   Method used:   Faxed to ...       [facility] (retail)       [address]       [place], [place]  [address]  [place]       Ph: [phone]       Fax: [phone]   RxID:   [id number] NASONEX 50 MCG/ACT SUSP (MOMETASONE FUROATE) two sprays in each nostril daily  #1 x 5   Entered and Authorized by:   Ellery Plunk MD   Signed by:   Ellery Plunk MD on 07/22/2010   Method used:  Print then Give to Patient   RxID:   [id number] NASONEX 50 MCG/ACT SUSP (MOMETASONE FUROATE) two sprays in each nostril daily  #1 x 5   Entered and Authorized by:   Ellery Plunk MD   Signed by:   Ellery Plunk MD on 07/22/2010   Method used:   Faxed to ...       [facility] (retail)       [address] [place], [place]  [address]       Ph: [phone]       Fax: [phone]   RxID:   [id number] CLARITIN 10 MG TABS (LORATADINE) take one every day  #30 x 11   Entered and Authorized by:   Ellery Plunk MD   Signed by:   Ellery Plunk MD on 07/22/2010   Method used:   Faxed to ...       [facility] (retail)       [address] [place], [place]  [address]       Ph: [phone]       Fax: [phone]   RxID:   [id number] TRAMADOL HCL 50 MG TABS (TRAMADOL HCL) 1 by mouth bidprn pain  #60 x 2   Entered and Authorized by:   Ellery Plunk MD   Signed by:   Ellery Plunk MD on 07/22/2010   Method used:   Faxed to ...       [facility] (retail)       [address] [place], [place]  [address]       Ph: [phone]       Fax: [phone]   RxID:   [id number] DICLOFENAC SODIUM 75 MG TBEC (DICLOFENAC SODIUM) 1 by mouth bid  #60 x 2   Entered and Authorized by:   Ellery Plunk MD   Signed by:   Ellery Plunk MD on  07/22/2010   Method used:   Electronically to        [facility]  [phone]* (retail)       [address]       [place], [place]  [address]       Ph: [phone] or [phone]       Fax: [phone]   RxID:   [id number] NORTRIPTYLINE HCL 25 MG CAPS (NORTRIPTYLINE HCL) take one tablet at bedtime  #30 x 3   Entered and Authorized by:   Ellery Plunk MD   Signed by:   Ellery Plunk MD on 07/22/2010   Method used:   Electronically to        [facility]  [phone]* (retail)       [address]       [place], [place]  [address]       Ph: [phone] or [phone]       Fax: [phone]   RxID:   [id number]    Orders Added: 1)  Flu Vaccine 28yrs + [22297] 2)  Admin 1st Vaccine [90471] 3)  [facility]- Est  Level 4 [98921]   Immunizations Administered:  Influenza Vaccine # 1:    Vaccine Type: Fluvax 3+    Site: left deltoid    Mfr: GlaxoSmithKline    Dose: 0.5 ml    Route: IM    Given by: Huntley Dec  Evans, CMA    Exp. Date: 01/29/2011    Lot #: [id number]    VIS given: 02/23/10 version given July 22, 2010.  Flu Vaccine Consent Questions:    Do you have a history of severe allergic reactions to this vaccine? no    Any prior history of allergic reactions to egg and/or gelatin? no    Do you have a sensitivity to the preservative Thimersol? no    Do you have a past history of Guillan-Barre Syndrome? no    Do you currently have an acute febrile illness? no    Have you ever had a severe reaction to latex? no    Vaccine information given and explained to patient? yes    Are you currently pregnant? no   Immunizations Administered:  Influenza Vaccine # 1:    Vaccine Type: Fluvax 3+    Site: left deltoid    Mfr: GlaxoSmithKline    Dose: 0.5 ml    Route: IM    Given by: Jimmy Footman, CMA    Exp. Date: 01/29/2011    Lot #: [id number]    VIS given: 02/23/10 version given July 22, 2010.

## 2010-09-02 NOTE — Progress Notes (Signed)
Summary: phn msg  Phone Note Call from Patient Call back at Work Phone [phone]   Caller: Patient Summary of Call: has been waiting to hear from [facility] ortho and she didn't know if we had her correct contact phn # - it is now correct and above - pls let her know. Initial call taken by: De Nurse,  August 19, 2010 1:43 PM  Follow-up for Phone Call        Spoke with patient informed her that I spoke with Ortho at [facility] and refaxed her referral and that she should hear from them within the next two weeks. Follow-up by: Garen Grams LPN,  August 23, 2010 10:36 AM     Appended Document: phn msg Spoke with Dr. Idalia Needle nurse she was unable to locate patients charts but told me she would call me back as soon as she found it .  Appended Document: phn msg The above information was placed in the wrong patients chart.

## 2010-09-15 ENCOUNTER — Ambulatory Visit: Payer: Self-pay | Admitting: Obstetrics and Gynecology

## 2010-10-04 ENCOUNTER — Telehealth: Payer: Self-pay | Admitting: *Deleted

## 2010-10-04 NOTE — Telephone Encounter (Signed)
Lm for pt that the forms are ready to be picked up./Shellye Zandi, Esperanza Richters

## 2010-10-04 NOTE — Telephone Encounter (Signed)
Pt came in with a form to be done by md. It is a FMLA so her dtr can be out of work to help care for her mom. Knee surgery is 4/14. Message to pcp that form is in her chart box. She request this asap.Colleen Olson

## 2010-10-11 ENCOUNTER — Other Ambulatory Visit: Payer: Self-pay | Admitting: Family Medicine

## 2010-10-11 NOTE — Telephone Encounter (Signed)
Refill request

## 2010-10-12 NOTE — Telephone Encounter (Signed)
Will discuss with pt on the 15th at visit

## 2010-10-14 ENCOUNTER — Ambulatory Visit (INDEPENDENT_AMBULATORY_CARE_PROVIDER_SITE_OTHER): Payer: Self-pay | Admitting: Family Medicine

## 2010-10-14 VITALS — BP 123/74 | HR 113 | Temp 98.0°F | Wt 280.1 lb

## 2010-10-14 DIAGNOSIS — R7989 Other specified abnormal findings of blood chemistry: Secondary | ICD-10-CM

## 2010-10-14 DIAGNOSIS — N951 Menopausal and female climacteric states: Secondary | ICD-10-CM

## 2010-10-14 DIAGNOSIS — M25569 Pain in unspecified knee: Secondary | ICD-10-CM

## 2010-10-14 LAB — COMPREHENSIVE METABOLIC PANEL
ALT: 18 U/L (ref 0–35)
AST: 47 U/L — ABNORMAL HIGH (ref 0–37)
Alkaline Phosphatase: 246 U/L — ABNORMAL HIGH (ref 39–117)
BUN: 9 mg/dL (ref 6–23)
CO2: 22 mEq/L (ref 19–32)
Calcium: 9.3 mg/dL (ref 8.4–10.5)
Creat: 0.8 mg/dL (ref 0.40–1.20)
Glucose, Bld: 99 mg/dL (ref 70–99)
Potassium: 3.9 mEq/L (ref 3.5–5.3)
Sodium: 134 mEq/L — ABNORMAL LOW (ref 135–145)

## 2010-10-14 LAB — CONVERTED CEMR LAB
ALT: 18 units/L (ref 0–35)
CO2: 22 meq/L (ref 19–32)
Calcium: 9.3 mg/dL (ref 8.4–10.5)
Chloride: 101 meq/L (ref 96–112)
Creatinine, Ser: 0.8 mg/dL (ref 0.40–1.20)
Glucose, Bld: 99 mg/dL (ref 70–99)
HCT: 34 % — ABNORMAL LOW (ref 36.0–46.0)
MCV: 93.7 fL (ref 78.0–100.0)
Sodium: 134 meq/L — ABNORMAL LOW (ref 135–145)

## 2010-10-14 MED ORDER — OXYCODONE HCL 10 MG PO TABS: 1.0000 | ORAL_TABLET | Freq: Three times a day (TID) | ORAL | Status: DC

## 2010-10-14 MED ORDER — DICLOFENAC SODIUM 75 MG PO TBEC: 75.0000 mg | DELAYED_RELEASE_TABLET | Freq: Two times a day (BID) | ORAL | Status: DC

## 2010-10-14 MED ORDER — TRAMADOL HCL 50 MG PO TABS: 50.0000 mg | ORAL_TABLET | Freq: Two times a day (BID) | ORAL | Status: DC | PRN

## 2010-10-14 MED ORDER — CLONIDINE HCL 0.1 MG PO TABS: 0.1000 mg | ORAL_TABLET | Freq: Two times a day (BID) | ORAL | Status: DC

## 2010-10-14 NOTE — Assessment & Plan Note (Signed)
According to preop clearance w/u, elevation in LFTs and alk phos.  Will recheck today with [facility].  Check CBC for ? Of hereditary hemochromocytosis.  Will get RUQ u/s to eval gall bladder, liver.  RTC in one week for results discussion.

## 2010-10-14 NOTE — Progress Notes (Signed)
  Subjective:    Patient ID: Colleen Olson, female    DOB: Mar 13, 1961, 50 y.o.   MRN: [id number]  HPI Knee pain--Pt went for preop clearance and surgery was cancelled due to LFT elevation.  Pt returns for w/u.  Denies abd pain, pain with eating, changes in bowel movts, fevers, using tylenol other than in what is in vicodin.    Hot flashes-  Pt states that hot flashes are worse than before.  Happens at least BID and is distressing to her.  Not taking any supplements now.  Would like something for treatment.  Pt denies alcohol abuse  Review of Systems Denies HA, CP, abd pain    Objective:   Physical Exam    Vital signs reviewed General appearance - alert, well appearing, and in no distress and oriented to person, place, and time Chest - clear to auscultation, no wheezes, rales or rhonchi, symmetric air entry, no tachypnea, retractions or cyanosis Heart - normal rate, regular rhythm, normal S1, S2, no murmurs, rubs, clicks or gallops Abdomen - soft, nontender, nondistended, no masses or organomegaly-eam limited by body habitus      Assessment & Plan:

## 2010-10-14 NOTE — Assessment & Plan Note (Signed)
Surgery was canceled due to LFT elevation.  Will work up and try to get surgery rescheduled.  For now continue pain meds.  Switched to oxycodone to avoid tylenol.  Unable to get long acting due to pharmacy restricitons.  Advised her that these may be stronger, may need to cut in half.

## 2010-10-14 NOTE — Patient Instructions (Signed)
I am giving you oxycodone today to avoid any tylenol.   Stop taking vicodin and do not take tylenol--do not drink any alcohol I am checking your labwork again Go for your ultrasound Lets meet again in 1 week to review results Try the clonidine for the hot flashes

## 2010-10-14 NOTE — Assessment & Plan Note (Signed)
These are chronic but getting worse.  Pt would like something to treat these. Will try low dose clonidine.  reeval in 1-2 weeks when pt RTC.  Advised pt to taper if she wants to stop them.

## 2010-10-15 ENCOUNTER — Telehealth: Payer: Self-pay | Admitting: Family Medicine

## 2010-10-15 ENCOUNTER — Other Ambulatory Visit: Payer: Self-pay | Admitting: Family Medicine

## 2010-10-15 LAB — CBC
HCT: 34 % — ABNORMAL LOW (ref 36.0–46.0)
MCH: 28.7 pg (ref 26.0–34.0)
MCV: 93.7 fL (ref 78.0–100.0)
RBC: 3.63 MIL/uL — ABNORMAL LOW (ref 3.87–5.11)
RDW: 19 % — ABNORMAL HIGH (ref 11.5–15.5)
WBC: 5.2 10*3/uL (ref 4.0–10.5)

## 2010-10-15 MED ORDER — OXYCODONE HCL 10 MG PO TABS: 1.0000 | ORAL_TABLET | Freq: Three times a day (TID) | ORAL | Status: DC

## 2010-10-15 NOTE — Telephone Encounter (Signed)
To pcp to change the rx.Colleen Olson

## 2010-10-15 NOTE — Telephone Encounter (Signed)
Pt informed of this and ask if the MD knows that she "has no income".  Apologized and attempted tot explain to her that this was the cheapest in town.  She did not answer but promptly hung up [employer], [facility].

## 2010-10-15 NOTE — Telephone Encounter (Signed)
Was given script for Oxycodone yesterday, and all the pharmacies she has called want $200 for it.  Cannot afford it - needs something different.

## 2010-10-15 NOTE — Telephone Encounter (Signed)
Called outpt pharmacy and it costs 32 dollars.  They are looking to see if they can get it on the 6 dollar list but for now that is the cheapest in town by a lot.  Please have her pick up a partial supply if unable to pay for the whole thing now

## 2010-10-15 NOTE — Telephone Encounter (Signed)
Pt called again to say she needed to hear something today.  Cannot go all weekend in pain.

## 2010-10-18 ENCOUNTER — Ambulatory Visit (HOSPITAL_COMMUNITY)
Admission: RE | Admit: 2010-10-18 | Discharge: 2010-10-18 | Disposition: A | Discharge status: Home / Self Care | Payer: Self-pay | Source: Ambulatory Visit | Attending: Family Medicine | Admitting: Family Medicine

## 2010-10-18 DIAGNOSIS — R7989 Other specified abnormal findings of blood chemistry: Secondary | ICD-10-CM | POA: Insufficient documentation

## 2010-10-25 ENCOUNTER — Encounter: Payer: Self-pay | Admitting: Family Medicine

## 2010-10-25 ENCOUNTER — Ambulatory Visit (INDEPENDENT_AMBULATORY_CARE_PROVIDER_SITE_OTHER): Payer: Self-pay | Admitting: Family Medicine

## 2010-10-25 VITALS — BP 100/68 | HR 104 | Temp 97.6°F | Wt 280.6 lb

## 2010-10-25 DIAGNOSIS — R7989 Other specified abnormal findings of blood chemistry: Secondary | ICD-10-CM

## 2010-10-25 DIAGNOSIS — D649 Anemia, unspecified: Secondary | ICD-10-CM | POA: Insufficient documentation

## 2010-10-25 LAB — FOLATE: Folate: 8.1 ng/mL

## 2010-10-25 LAB — COMPREHENSIVE METABOLIC PANEL
AST: 44 U/L — ABNORMAL HIGH (ref 0–37)
CO2: 23 mEq/L (ref 19–32)
Chloride: 100 mEq/L (ref 96–112)
Glucose, Bld: 141 mg/dL — ABNORMAL HIGH (ref 70–99)
Sodium: 136 mEq/L (ref 135–145)

## 2010-10-25 LAB — IRON AND TIBC
%SAT: 39 % (ref 20–55)
Iron: 120 ug/dL (ref 42–145)
UIBC: 189 ug/dL

## 2010-10-25 LAB — VITAMIN B12: Vitamin B-12: 966 pg/mL — ABNORMAL HIGH (ref 211–911)

## 2010-10-25 LAB — FERRITIN: Ferritin: 386 ng/mL — ABNORMAL HIGH (ref 10–291)

## 2010-10-25 NOTE — Assessment & Plan Note (Signed)
Anemia panel today.  If Iron deficient, will need to figure out why.  If not, consider chronic disease from liver disease or ? hemoglobinopathy

## 2010-10-25 NOTE — Progress Notes (Signed)
  Subjective:    Patient ID: Klair Leising, female    DOB: September 22, 1960, 50 y.o.   MRN: [id number]  HPI  Pt still in pain with knees.  Has avoided all tylenol, taking oxycodone.  No alcohol use.   No changes in BMs  Review of Systems Denies CP, HA, or abd pain    Objective:   Physical Exam    Vital signs reviewed General appearance - alert, well appearing, and in no distress and oriented to person, place, and time Heart - normal rate, regular rhythm, normal S1, S2, no murmurs, rubs, clicks or gallops Chest - clear to auscultation, no wheezes, rales or rhonchi, symmetric air entry, no tachypnea, retractions or cyanosis Abdomen - soft, nontender, nondistended, no masses or organomegaly     Assessment & Plan:

## 2010-10-25 NOTE — Patient Instructions (Signed)
It was nice to see you today Please come back as you need me, especially after your surgery. You need to come in for refills of your oxycodone.  You will not be able to get these over the phone.  Your surgeon will manage your pain after surgery I will send you a letter if everything looks ok.

## 2010-10-25 NOTE — Assessment & Plan Note (Signed)
Discussed with Dr. Leveda Anna.  Will recheck LFTs, fractionate alk phos.  If still down or back to normal, will send [facility] a letter to tell them ok for OR, care with anesthesia.  Likely due to steatosis

## 2010-10-26 LAB — ALKALINE PHOSPHATASE, ISOENZYMES: ALP, Heat Stable (Liver): 81 U/L

## 2010-10-28 ENCOUNTER — Encounter: Payer: Self-pay | Admitting: Family Medicine

## 2010-10-28 ENCOUNTER — Telehealth: Payer: Self-pay | Admitting: Family Medicine

## 2010-10-28 NOTE — Telephone Encounter (Signed)
Talked with pt about results.  Kidney fxn up slightly, told her to stop diclofenac.  Pt agrees

## 2010-12-09 ENCOUNTER — Ambulatory Visit (INDEPENDENT_AMBULATORY_CARE_PROVIDER_SITE_OTHER): Payer: Self-pay | Admitting: Family Medicine

## 2010-12-09 ENCOUNTER — Encounter: Payer: Self-pay | Admitting: Family Medicine

## 2010-12-09 VITALS — BP 130/90 | HR 120 | Temp 98.1°F | Wt 270.0 lb

## 2010-12-09 DIAGNOSIS — N951 Menopausal and female climacteric states: Secondary | ICD-10-CM

## 2010-12-09 DIAGNOSIS — M1710 Unilateral primary osteoarthritis, unspecified knee: Secondary | ICD-10-CM

## 2010-12-09 DIAGNOSIS — I10 Essential (primary) hypertension: Secondary | ICD-10-CM

## 2010-12-09 MED ORDER — TRAMADOL HCL 50 MG PO TABS: 50.0000 mg | ORAL_TABLET | Freq: Two times a day (BID) | ORAL | Status: DC | PRN

## 2010-12-09 MED ORDER — OXYCODONE HCL 10 MG PO TABS: 1.0000 | ORAL_TABLET | Freq: Three times a day (TID) | ORAL | Status: DC

## 2010-12-09 MED ORDER — HYDROCHLOROTHIAZIDE 25 MG PO TABS: 25.0000 mg | ORAL_TABLET | Freq: Every day | ORAL | Status: DC

## 2010-12-09 MED ORDER — CLONIDINE HCL 0.1 MG PO TABS: 0.1000 mg | ORAL_TABLET | Freq: Two times a day (BID) | ORAL | Status: DC

## 2010-12-09 MED ORDER — NORTRIPTYLINE HCL 25 MG PO CAPS: 25.0000 mg | ORAL_CAPSULE | Freq: Every day | ORAL | Status: DC

## 2010-12-12 NOTE — Progress Notes (Signed)
  Subjective:    Patient ID: Dandria Griego, female    DOB: [birth year], 50 y.o.   MRN: [id number]  HPI  Presents today for med refills.  Knee pain- persists, TKR scheduled for  July.  Taking 20mg  oxycodone in AM and 10mg  at night.  Feels that this mostly controls her pain.    HTN-  Taking meds as prescribed.  No CP, SOB.  Review of Systems See above    Objective:   Physical Exam     Vital signs reviewed General appearance - alert, well appearing, and in no distress and oriented to person, place, and time Heart - normal rate, regular rhythm, normal S1, S2, no murmurs, rubs, clicks or gallops Chest - clear to auscultation, no wheezes, rales or rhonchi, symmetric air entry, no tachypnea, retractions or cyanosis Extremities - peripheral pulses normal, no pedal edema, no clubbing or cyanosis    Assessment & Plan:

## 2010-12-12 NOTE — Assessment & Plan Note (Signed)
Still with considerable pain.  Taking oxycodone 20mg  in AM and 10mg  at night.  Refilled with 2 months supply.  Pt scheduled for TKR in July

## 2010-12-12 NOTE — Assessment & Plan Note (Signed)
BP Readings from Last 3 Encounters:  12/09/10 130/90  10/25/10 100/68  10/14/10 123/74   Blood pressure at goal. Continue current management.

## 2011-02-03 ENCOUNTER — Ambulatory Visit (INDEPENDENT_AMBULATORY_CARE_PROVIDER_SITE_OTHER): Payer: Self-pay | Admitting: Family Medicine

## 2011-02-03 ENCOUNTER — Encounter: Payer: Self-pay | Admitting: Family Medicine

## 2011-02-03 DIAGNOSIS — M1710 Unilateral primary osteoarthritis, unspecified knee: Secondary | ICD-10-CM

## 2011-02-03 MED ORDER — OXYCODONE HCL 10 MG PO TABS: 1.0000 | ORAL_TABLET | Freq: Three times a day (TID) | ORAL | Status: DC

## 2011-02-03 NOTE — Assessment & Plan Note (Signed)
Surgery changed to 8/1 due to surgeon change.  Will refill 2 more months of pain meds.  After surgery plan for ortho to take over pain meds.

## 2011-02-03 NOTE — Patient Instructions (Signed)
Good luck with your surgery Be careful with your diet since your activity is limited with your pain Try to increase fruit and vegetables and decrease carbs like bread and pasta  I have given you two months of pain medication When you have the surgery the orthopedic doctor will take over the pain medication

## 2011-02-03 NOTE — Progress Notes (Signed)
  Subjective:    Patient ID: Colleen Olson, female    DOB: [birth year], 50 y.o.   MRN: [id number]  HPI  Pt with continued, stable knee pain bilaterally.  Awaiting 1st of likely 2 TKR.  Rescheduled for Aug 1.  Needs pain medication refill at this time.  Pain meds are helping but do not completely take pain away.  Unable to walk in AM without walker until she takes meds.    Review of Systems Denies CP, SOB, HA, N/V/D, fever     Objective:   Physical Exam Vital signs reviewed General appearance - alert, well appearing, and in no distress and oriented to person, place, and time Knees- bilateral crepitus, pain with mov't, obese but no effusion felt.  No heat       Assessment & Plan:

## 2011-02-19 ENCOUNTER — Encounter: Payer: Self-pay | Admitting: Family Medicine

## 2011-02-25 ENCOUNTER — Telehealth: Payer: Self-pay | Admitting: Family Medicine

## 2011-02-25 DIAGNOSIS — M1710 Unilateral primary osteoarthritis, unspecified knee: Secondary | ICD-10-CM

## 2011-02-25 NOTE — Telephone Encounter (Signed)
Rudell Cobb is asking that we send a written order via fax [facility] for a shower chair due to patient's upcoming knee surgery.  They will also need to have it stated that she gets 100% discount through [facility] that way she can get the service free of charge.

## 2011-02-28 NOTE — Telephone Encounter (Signed)
Faxed to [phone] [place], Jessica Dawn  

## 2011-03-02 HISTORY — PX: TOTAL KNEE ARTHROPLASTY: SHX125

## 2011-03-04 ENCOUNTER — Ambulatory Visit: Payer: Self-pay | Admitting: Physical Therapy

## 2011-03-08 ENCOUNTER — Ambulatory Visit: Payer: Self-pay | Attending: Orthopedic Surgery | Admitting: Physical Therapy

## 2011-03-08 DIAGNOSIS — IMO0001 Reserved for inherently not codable concepts without codable children: Secondary | ICD-10-CM | POA: Insufficient documentation

## 2011-03-08 DIAGNOSIS — M25669 Stiffness of unspecified knee, not elsewhere classified: Secondary | ICD-10-CM | POA: Insufficient documentation

## 2011-03-08 DIAGNOSIS — R262 Difficulty in walking, not elsewhere classified: Secondary | ICD-10-CM | POA: Insufficient documentation

## 2011-03-08 DIAGNOSIS — M25569 Pain in unspecified knee: Secondary | ICD-10-CM | POA: Insufficient documentation

## 2011-03-10 ENCOUNTER — Ambulatory Visit: Payer: Self-pay | Admitting: Physical Therapy

## 2011-03-15 ENCOUNTER — Ambulatory Visit: Payer: Self-pay | Admitting: Physical Therapy

## 2011-03-17 ENCOUNTER — Ambulatory Visit: Payer: Self-pay | Admitting: Physical Therapy

## 2011-03-21 ENCOUNTER — Ambulatory Visit: Payer: Self-pay | Admitting: Physical Therapy

## 2011-03-24 ENCOUNTER — Ambulatory Visit: Payer: Self-pay | Admitting: Physical Therapy

## 2011-03-29 ENCOUNTER — Ambulatory Visit: Payer: Self-pay | Admitting: Physical Therapy

## 2011-03-31 ENCOUNTER — Ambulatory Visit: Payer: Self-pay | Admitting: Physical Therapy

## 2011-04-05 ENCOUNTER — Encounter: Payer: Self-pay | Admitting: Physical Therapy

## 2011-04-07 ENCOUNTER — Ambulatory Visit: Payer: Self-pay | Attending: Orthopedic Surgery | Admitting: Physical Therapy

## 2011-04-07 DIAGNOSIS — M25569 Pain in unspecified knee: Secondary | ICD-10-CM | POA: Insufficient documentation

## 2011-04-07 DIAGNOSIS — M25669 Stiffness of unspecified knee, not elsewhere classified: Secondary | ICD-10-CM | POA: Insufficient documentation

## 2011-04-07 DIAGNOSIS — IMO0001 Reserved for inherently not codable concepts without codable children: Secondary | ICD-10-CM | POA: Insufficient documentation

## 2011-04-07 DIAGNOSIS — R262 Difficulty in walking, not elsewhere classified: Secondary | ICD-10-CM | POA: Insufficient documentation

## 2011-04-12 ENCOUNTER — Encounter: Payer: Self-pay | Admitting: Physical Therapy

## 2011-04-14 ENCOUNTER — Ambulatory Visit: Payer: Self-pay | Admitting: Physical Therapy

## 2011-04-19 ENCOUNTER — Ambulatory Visit: Payer: Self-pay | Admitting: Physical Therapy

## 2011-04-21 ENCOUNTER — Encounter: Payer: Self-pay | Admitting: Physical Therapy

## 2011-05-17 ENCOUNTER — Ambulatory Visit (INDEPENDENT_AMBULATORY_CARE_PROVIDER_SITE_OTHER): Payer: Self-pay | Admitting: Family Medicine

## 2011-05-17 ENCOUNTER — Encounter: Payer: Self-pay | Admitting: Family Medicine

## 2011-05-17 VITALS — BP 150/91 | HR 106 | Temp 97.7°F | Ht 64.0 in | Wt 289.0 lb

## 2011-05-17 DIAGNOSIS — Z23 Encounter for immunization: Secondary | ICD-10-CM

## 2011-05-17 DIAGNOSIS — I10 Essential (primary) hypertension: Secondary | ICD-10-CM

## 2011-05-17 DIAGNOSIS — M1710 Unilateral primary osteoarthritis, unspecified knee: Secondary | ICD-10-CM

## 2011-05-17 MED ORDER — OXYCODONE HCL 10 MG PO TABS: 1.0000 | ORAL_TABLET | Freq: Three times a day (TID) | ORAL | Status: DC

## 2011-05-17 NOTE — Assessment & Plan Note (Signed)
Pt above goal today.  States she missed one dose of HCTZ.  Will recheck one week.

## 2011-05-17 NOTE — Assessment & Plan Note (Signed)
Pt with TKR on left, still needs one on right.  Will continue pain management since hard for pt to get to [facility].

## 2011-05-17 NOTE — Progress Notes (Signed)
  Subjective:    Patient ID: Colleen Olson, female    DOB: 09-15-1960, 50 y.o.   MRN: [id number]  HPI  Knee pain- hard for her to get to f/u at [facility].  She would like pain management here.  She is healing well.  Her next appt is in 6 months  Weight- pt wants to loose weight. She is not eating breakfast, she drinks lemonade.  She eats little throughout the day and has a large dinner  HTN- missed one day of HCTZ.  Thinks her pressure is up due to pain.  No HA, SOB, or CP.    Review of Systems    see above.  No fevers Objective:   Physical Exam Vital signs reviewed General appearance - alert, well appearing, and in no distress and oriented to person, place, and time Heart - normal rate, regular rhythm, normal S1, S2, no murmurs, rubs, clicks or gallops Chest - clear to auscultation, no wheezes, rales or rhonchi, symmetric air entry, no tachypnea, retractions or cyanosis MSK- gait is improved but still with slight limp.  Full ROM of bilateral knees.       Assessment & Plan:

## 2011-05-17 NOTE — Assessment & Plan Note (Signed)
Pt states she is trying to lose weight and is not sure why not loosing.  Will have her fill out food diary for 1 week and see me to reevaluate.  Needs to loose weight for knees.

## 2011-05-17 NOTE — Patient Instructions (Signed)
Please write down every bite that you take and what you drink and what time for the next week We will look at it when you come back We will also recheck your blood pressure then   Do not accept any pain medicine from anyone else We will manage your pain medication.

## 2011-05-25 ENCOUNTER — Encounter: Payer: Self-pay | Admitting: Family Medicine

## 2011-05-25 ENCOUNTER — Ambulatory Visit (INDEPENDENT_AMBULATORY_CARE_PROVIDER_SITE_OTHER): Payer: Self-pay | Admitting: Family Medicine

## 2011-05-25 VITALS — BP 137/78 | HR 116 | Temp 97.6°F | Ht 64.0 in | Wt 290.0 lb

## 2011-05-25 DIAGNOSIS — I10 Essential (primary) hypertension: Secondary | ICD-10-CM

## 2011-05-25 DIAGNOSIS — Z23 Encounter for immunization: Secondary | ICD-10-CM

## 2011-05-25 DIAGNOSIS — Z1211 Encounter for screening for malignant neoplasm of colon: Secondary | ICD-10-CM

## 2011-05-25 NOTE — Assessment & Plan Note (Signed)
Patient's blood pressure is at goal today. We'll continue current regimen.

## 2011-05-25 NOTE — Assessment & Plan Note (Signed)
Discussed dietary interventions her weight loss. See handout for instructions.

## 2011-05-25 NOTE — Progress Notes (Signed)
  Subjective:    Patient ID: Colleen Olson, female    DOB: 1961/05/25, 50 y.o.   MRN: [id number]  HPI Obesity and nutrition-patient brought in a log of her eating for the last week. Her typical breakfast is 2 hashbrowns, 1-3 sausage links, and several boiled eggs. She often needs 80 ounces of steak. She ate 3 meals a day for each of these days. She does eat vegetables like spinach and salads. She refuses see broccoli or other vegetables. She eats lots of red meat, and other high saturated fat foods. She fries her chicken and fish.  Hypertension-patient has been taking her medications as prescribed. She denies headache or chest pain. Review of Systems Denies CP, SOB, HA, N/V/D, fever     Objective:   Physical Exam Vital signs reviewed General appearance - alert, well appearing, and in no distress and oriented to person, place, and time Heart - normal rate, regular rhythm, normal S1, S2, no murmurs, rubs, clicks or gallops Chest - clear to auscultation, no wheezes, rales or rhonchi, symmetric air entry, no tachypnea, retractions or cyanosis       Assessment & Plan:

## 2011-05-25 NOTE — Patient Instructions (Signed)
It was nice to see you again Thanks for doing that list for me---it really helped me We talked about eating less saturated fats (meat, sausage, egg yolk) and more veggies For breakfast: 1 link of [place] sausage, 1 hash brown, and as many egg whites as you want.  You should try to limit to 2 egg yolks For lunch and dinner:  1/2 the plate is veggies, 1/4 meat, 1/4 starch (corn, bread, potato, and rice) Try to bake your foods--not fry  I will see you in January

## 2011-07-07 ENCOUNTER — Ambulatory Visit: Payer: Self-pay | Attending: Orthopedic Surgery | Admitting: Physical Therapy

## 2011-07-07 DIAGNOSIS — M25569 Pain in unspecified knee: Secondary | ICD-10-CM | POA: Insufficient documentation

## 2011-07-07 DIAGNOSIS — M25669 Stiffness of unspecified knee, not elsewhere classified: Secondary | ICD-10-CM | POA: Insufficient documentation

## 2011-07-07 DIAGNOSIS — R262 Difficulty in walking, not elsewhere classified: Secondary | ICD-10-CM | POA: Insufficient documentation

## 2011-07-07 DIAGNOSIS — IMO0001 Reserved for inherently not codable concepts without codable children: Secondary | ICD-10-CM | POA: Insufficient documentation

## 2011-07-11 ENCOUNTER — Ambulatory Visit: Payer: Self-pay | Admitting: Physical Therapy

## 2011-07-12 ENCOUNTER — Ambulatory Visit (INDEPENDENT_AMBULATORY_CARE_PROVIDER_SITE_OTHER): Payer: Self-pay | Admitting: Family Medicine

## 2011-07-12 ENCOUNTER — Encounter: Payer: Self-pay | Admitting: Family Medicine

## 2011-07-12 VITALS — BP 153/90 | HR 112 | Ht 68.0 in | Wt 297.0 lb

## 2011-07-12 DIAGNOSIS — N951 Menopausal and female climacteric states: Secondary | ICD-10-CM

## 2011-07-12 DIAGNOSIS — M1710 Unilateral primary osteoarthritis, unspecified knee: Secondary | ICD-10-CM

## 2011-07-12 MED ORDER — NORTRIPTYLINE HCL 25 MG PO CAPS: 25.0000 mg | ORAL_CAPSULE | Freq: Every day | ORAL | Status: DC

## 2011-07-12 MED ORDER — OXYCODONE HCL 10 MG PO TABS: 10.0000 mg | ORAL_TABLET | Freq: Three times a day (TID) | ORAL | Status: DC

## 2011-07-12 MED ORDER — TRAMADOL HCL 50 MG PO TABS: 50.0000 mg | ORAL_TABLET | Freq: Two times a day (BID) | ORAL | Status: DC | PRN

## 2011-07-12 MED ORDER — CLONIDINE HCL 0.2 MG PO TABS: 0.2000 mg | ORAL_TABLET | Freq: Three times a day (TID) | ORAL | Status: DC

## 2011-07-12 MED ORDER — CLONIDINE HCL 0.1 MG PO TABS: 0.2000 mg | ORAL_TABLET | Freq: Three times a day (TID) | ORAL | Status: DC

## 2011-07-12 NOTE — Assessment & Plan Note (Signed)
Patient back in physical therapy. Still having pain but feels like she can do all her daily activities including climbs stairs when she is on pain medicine.

## 2011-07-12 NOTE — Assessment & Plan Note (Signed)
Patient feels like clonidine helped her hot flashes though they've been getting worse. Since she had benefit from clonidine, I will continue her clonidine. I will increase it to 0.2 mg 3 times a day. She is to call if this is too many side effects. Consider SSRIs considering she has irritability as well if this does not work .

## 2011-07-12 NOTE — Progress Notes (Signed)
  Subjective:    Patient ID: Colleen Olson, female    DOB: 01-15-1961, 50 y.o.   MRN: [id number]  HPI Knee pain-patient's having continued knee pain. She's been seen again by her orthopedic doctor who put her back in 4 more weeks physical therapy. She thinks this helps her. She smoked an apartment where she has to climb stairs and this is difficult for her. She does while on her pain medication has not changed any of her doses. These help her complete her ADLs and climb her stairs. Hot flashes-patient has been taking clonidine. She feels like initially this helped her a lot in the last 3 weeks she's been having increasing hot flashes. She has them at least 2-3 times a day. He last 10-15 minutes. She feels like she is getting more irritable with them. She is not having side effects from clonidine and would like to continue it if possible.   Review of Systems No chest pain, shortness breath, diarrhea, nausea,    Objective:   Physical Exam Vital signs reviewed General appearance - alert, well appearing, and in no distress and oriented to person, place, and time Heart - normal rate, regular rhythm, normal S1, S2, no murmurs, rubs, clicks or gallops Chest - clear to auscultation, no wheezes, rales or rhonchi, symmetric air entry, no tachypnea, retractions or cyanosis MSK-no knee swelling. Mild pain with full extension but range of motion full       Assessment & Plan:

## 2011-07-12 NOTE — Patient Instructions (Signed)
Take 2 tabs clonidine twice a day until you run out Then start the new bottle, 1 pill three times a day If you get too much dry mouth or you feel funny, call

## 2011-07-13 ENCOUNTER — Ambulatory Visit: Payer: Self-pay

## 2011-07-19 ENCOUNTER — Encounter: Payer: Self-pay | Admitting: Physical Therapy

## 2011-07-20 ENCOUNTER — Ambulatory Visit: Payer: Self-pay

## 2011-07-20 ENCOUNTER — Other Ambulatory Visit: Payer: Self-pay | Admitting: *Deleted

## 2011-07-20 DIAGNOSIS — Z1231 Encounter for screening mammogram for malignant neoplasm of breast: Secondary | ICD-10-CM

## 2011-07-22 ENCOUNTER — Ambulatory Visit: Payer: Self-pay | Admitting: Physical Therapy

## 2011-07-27 ENCOUNTER — Ambulatory Visit: Payer: Self-pay

## 2011-07-29 ENCOUNTER — Ambulatory Visit: Payer: Self-pay | Admitting: Physical Therapy

## 2011-08-03 ENCOUNTER — Ambulatory Visit: Payer: Self-pay | Attending: Orthopedic Surgery

## 2011-08-03 DIAGNOSIS — IMO0001 Reserved for inherently not codable concepts without codable children: Secondary | ICD-10-CM | POA: Insufficient documentation

## 2011-08-03 DIAGNOSIS — M25669 Stiffness of unspecified knee, not elsewhere classified: Secondary | ICD-10-CM | POA: Insufficient documentation

## 2011-08-03 DIAGNOSIS — R262 Difficulty in walking, not elsewhere classified: Secondary | ICD-10-CM | POA: Insufficient documentation

## 2011-08-03 DIAGNOSIS — M25569 Pain in unspecified knee: Secondary | ICD-10-CM | POA: Insufficient documentation

## 2011-08-05 ENCOUNTER — Ambulatory Visit: Payer: Self-pay | Admitting: Physical Therapy

## 2011-08-18 ENCOUNTER — Ambulatory Visit
Admission: RE | Admit: 2011-08-18 | Discharge: 2011-08-18 | Disposition: A | Discharge status: Home / Self Care | Payer: Self-pay | Source: Ambulatory Visit | Attending: *Deleted | Admitting: *Deleted

## 2011-08-18 DIAGNOSIS — Z1231 Encounter for screening mammogram for malignant neoplasm of breast: Secondary | ICD-10-CM

## 2011-09-09 ENCOUNTER — Ambulatory Visit (HOSPITAL_COMMUNITY)
Admission: RE | Admit: 2011-09-09 | Discharge: 2011-09-09 | Disposition: A | Discharge status: Home / Self Care | Payer: Self-pay | Source: Ambulatory Visit | Attending: Family Medicine | Admitting: Family Medicine

## 2011-09-09 ENCOUNTER — Encounter: Payer: Self-pay | Admitting: Family Medicine

## 2011-09-09 ENCOUNTER — Ambulatory Visit (INDEPENDENT_AMBULATORY_CARE_PROVIDER_SITE_OTHER): Payer: Self-pay | Admitting: Family Medicine

## 2011-09-09 DIAGNOSIS — J309 Allergic rhinitis, unspecified: Secondary | ICD-10-CM

## 2011-09-09 DIAGNOSIS — M549 Dorsalgia, unspecified: Secondary | ICD-10-CM

## 2011-09-09 DIAGNOSIS — E669 Obesity, unspecified: Secondary | ICD-10-CM | POA: Insufficient documentation

## 2011-09-09 LAB — LIPID PANEL
Cholesterol: 168 mg/dL (ref 0–200)
HDL: 54 mg/dL (ref >39)
LDL Cholesterol: 85 mg/dL (ref 0–99)
Triglycerides: 144 mg/dL (ref <150)

## 2011-09-09 LAB — COMPREHENSIVE METABOLIC PANEL
ALT: 26 U/L (ref 0–35)
BUN: 10 mg/dL (ref 6–23)
CO2: 27 mEq/L (ref 19–32)
Calcium: 9.4 mg/dL (ref 8.4–10.5)
Creat: 0.83 mg/dL (ref 0.50–1.10)
Total Bilirubin: 0.4 mg/dL (ref 0.3–1.2)

## 2011-09-09 MED ORDER — OXYCODONE HCL 10 MG PO TABS: 10.0000 mg | ORAL_TABLET | Freq: Three times a day (TID) | ORAL | Status: DC

## 2011-09-09 NOTE — Assessment & Plan Note (Signed)
Pain is worse bending forward. No red flags. Likely due to weight gain. Advised water aerobics. Will follow pain. X-rays to look for stenosis.

## 2011-09-09 NOTE — Patient Instructions (Signed)
Please get Lose it! On your phone Go to the [facility] for water aerobics every day or as often as possible  I am checking some labs today Pap smear in one year  i will send you for xrays

## 2011-09-09 NOTE — Assessment & Plan Note (Signed)
Several weeks of clear nasal drainage and eye itching. She was unable to obtained a nasal steroid. She is taking the Claritin. Continue Claritin and nasal saline rinses. Would be willing to re\re prescribe nasal steroids if patient wanted to go through the process of getting approved for the map program. patient did not want to go through this.

## 2011-09-09 NOTE — Progress Notes (Signed)
  Subjective:    Patient ID: Colleen Olson, female    DOB: [birth year], 51 y.o.   MRN: [id number]  HPI Cough-patient notes some mild cough, runny nose, sore throat, itchy eyes. Predominant symptoms are runny nose and itchy eyes. She is taking Claritin but never started her nasal steroid because she did not want to complete the MAP paperwork. Had one episode where she felt hot but has not checked any temperatures.  Lower back pain-patient notes lower back pain for the last several years that is worsening slowly. She denies any injury. She states it is hard for her to stand for a long time because of her back pain. She denies muscle weakness in her legs, changing sensations, loss of bowel or bladder.  Knee pain-patient's status post 1 knee replacement and significant knee pain in the other knee. She is taking her pain medications as prescribed. She states that these help her walk around, with her cane and her physical therapy. She states that she is able to do her activities of daily living, cook herself with her pain medicine.   Nonsmoker but exposed to secondhand smoke Review of Systems Denies diarrhea, nausea vomiting, chest pain, shortness of breath     Objective:   Physical Exam Vital signs reviewed General appearance - alert, well appearing, and in no distress and oriented to person, place, and time Neurological - alert, oriented, normal speech, no focal findings or movement disorder noted, screening mental status exam normal, neck supple without rigidity, cranial nerves II through XII intact Heart - normal rate, regular rhythm, normal S1, S2, no murmurs, rubs, clicks or gallops Chest - clear to auscultation, no wheezes, rales or rhonchi, symmetric air entry, no tachypnea, retractions or cyanosis Abdomen - soft, nontender, nondistended, no masses or organomegaly Gait-antalgic, walking with cane. Able to get up from a chair and walk but must use her hands for support. MSK-back with full  range of motion. There is some tenderness when she leans forward. There is some mild tenderness along the spinous processes in the lumbar region.       Assessment & Plan:

## 2011-09-12 ENCOUNTER — Encounter: Payer: Self-pay | Admitting: Family Medicine

## 2011-09-12 ENCOUNTER — Telehealth: Payer: Self-pay | Admitting: Family Medicine

## 2011-09-12 NOTE — Telephone Encounter (Signed)
Call to let her know that x-ray of her back was normal. She should continue exercising, and try water.

## 2011-09-28 ENCOUNTER — Telehealth: Payer: Self-pay | Admitting: Family Medicine

## 2011-09-28 NOTE — Telephone Encounter (Signed)
Appt made for tomorrow. Colleen Olson Dawn  

## 2011-09-28 NOTE — Telephone Encounter (Signed)
Patient is calling and is very tearful, because the medication that was prescribed to stop her hot flashes has worked, but according to her pharmacist one of the side effects is losing hair.  And she says she is now bald on the back of her head.  She would really like to speak to Dr. Hulen Luster.

## 2011-09-29 ENCOUNTER — Encounter: Payer: Self-pay | Admitting: Family Medicine

## 2011-09-29 ENCOUNTER — Ambulatory Visit (INDEPENDENT_AMBULATORY_CARE_PROVIDER_SITE_OTHER): Payer: Self-pay | Admitting: Family Medicine

## 2011-09-29 VITALS — BP 145/93 | HR 117 | Temp 97.9°F | Ht 68.0 in | Wt 301.0 lb

## 2011-09-29 DIAGNOSIS — L659 Nonscarring hair loss, unspecified: Secondary | ICD-10-CM | POA: Insufficient documentation

## 2011-09-29 DIAGNOSIS — N951 Menopausal and female climacteric states: Secondary | ICD-10-CM

## 2011-09-29 MED ORDER — PAROXETINE HCL 20 MG PO TABS: 20.0000 mg | ORAL_TABLET | ORAL | Status: DC

## 2011-09-29 NOTE — Progress Notes (Signed)
  Subjective:    Patient ID: Colleen Olson, female    DOB: 1961-04-21, 51 y.o.   MRN: [id number]  HPI Patient presents with one week of hair loss. She states she is having more hair, for her brush. She thinks that this is consistent with when she increased her clonidine dose. She called her pharmacist who felt that this was a side effect of clonidine. She decided to stop her clonidine 1days ago. The clonidine was for hot flashes. She is currently not having any hot flashes.   Review of Systems No fevers, chills, rashes, itching    Objective:   Physical Exam Vital signs reviewed General appearance - alert, well appearing, and in no distress and oriented to person, place, and time Head-hair is thin in the back at the nape of the neck. There are many broken hairs in the back of the neck . There is no bald spot.       Assessment & Plan:

## 2011-09-29 NOTE — Patient Instructions (Signed)
Please try the Paxil. It may take a few weeks to start really been effective It would be helpful if you kept a journal of your hot flashes to see how many you have per day  Come back and see me in a month

## 2011-09-29 NOTE — Assessment & Plan Note (Signed)
With complaint of hair loss from back of head. She feels this is correlated with increasing her clonidine. She has stopped her clonidine. She would like you on another medication for hot flashes I am starting Paxil today. I do not think she has any other red flags for hair loss as she has a fairly consistent medication regimen but I would consider checking her thyroid function if this continues.

## 2011-09-29 NOTE — Assessment & Plan Note (Addendum)
Patient felt the clonidine helped her hot flashes but she just stopped it. I will start her on Paxil. She is to follow up in one-month.  I've asked her to keep a journal of her flashes. I reviewed the symptoms of serotonin syndrome with patient. She was able to teach it back to me.

## 2011-10-27 ENCOUNTER — Encounter: Payer: Self-pay | Admitting: Family Medicine

## 2011-10-27 ENCOUNTER — Ambulatory Visit (INDEPENDENT_AMBULATORY_CARE_PROVIDER_SITE_OTHER): Payer: Self-pay | Admitting: Family Medicine

## 2011-10-27 VITALS — BP 135/88 | HR 117 | Temp 98.2°F | Ht 68.0 in | Wt 299.0 lb

## 2011-10-27 DIAGNOSIS — I10 Essential (primary) hypertension: Secondary | ICD-10-CM

## 2011-10-27 DIAGNOSIS — N951 Menopausal and female climacteric states: Secondary | ICD-10-CM

## 2011-10-27 DIAGNOSIS — M1710 Unilateral primary osteoarthritis, unspecified knee: Secondary | ICD-10-CM

## 2011-10-27 DIAGNOSIS — L659 Nonscarring hair loss, unspecified: Secondary | ICD-10-CM

## 2011-10-27 MED ORDER — OXYCODONE HCL 10 MG PO TABS: 10.0000 mg | ORAL_TABLET | Freq: Three times a day (TID) | ORAL | Status: DC

## 2011-10-27 NOTE — Assessment & Plan Note (Signed)
Patient with 4 pound weight loss. She's been writing or what she eats. She is exercising with water aerobics. She is very proud of herself and I am too.

## 2011-10-27 NOTE — Progress Notes (Signed)
  Subjective:    Patient ID: Colleen Olson, female    DOB: 1960-08-13, 51 y.o.   MRN: [id number]  HPI Bilateral knee pain-patient with chronic knee pain due to osteoporosis. She has recently seen her orthopedic surgeon. He thinks both knees will need to be replaced within 2 years. She is satisfied with her current pain management regimen. She is taking tramadol as well as oxycodone 3 times a day. This is helping her with her exercise and day-to-day function.  Hypertension-patient is taking her blood pressure medication as prescribed. She denies shortness of breath, chest pain, dizziness, lightheadedness. She does not check her blood pressure at home.  Hot flashes-patient has been taking Paxil for the last 2-3 weeks. She noticed that she has gone from 3 hot flashes per day to one hot flash per day. She would like to do better than this. She is willing to wait and see about improvement for another 4 weeks. She did better than this on the clonidine but her hair loss had her stop her clonidine. Since stopping the clonidine she has not lost any more hair.  Obesity-patient has lost 4 pounds last month. She has been watching what she eats as well as water aerobics. She plans to continue this change  nonsmoker Review of Systems Denies CP, SOB, HA, N/V/D, fever     Objective:   Physical Exam  Vital signs reviewed General appearance - alert, well appearing, and in no distress Heart - normal rate, regular rhythm, normal S1, S2, no murmurs, rubs, clicks or gallops Chest - clear to auscultation, no wheezes, rales or rhonchi, symmetric air entry, no tachypnea, retractions or cyanosis MSK-bilaterally knees have crepitus. Exam limited due to body habitus. She walks with a mild limp.      Assessment & Plan:  DEGENERATIVE JOINT DISEASE, BOTH KNEES, SEVERE Patient with chronic knee pain bilaterally. She has seen her orthopedic surgeon. He think she will need another knee replacement within 1-2 years. He  is encouraging her to lose weight. She is doing well and her oxycodone 10 mg 3 times a day. She and I understand we will not increase this and she is going to get medication only from me. She was doing well and has started to exercise with water aerobics.    ESSENTIAL HYPERTENSION, BENIGN BP Readings from Last 3 Encounters:  10/27/11 135/88  09/29/11 145/93  09/09/11 131/93   patient blood pressure at goal. Continue to monitor.   Hair loss Now that she is not on the clonidine, she has not been losing her hair. She is satisfied with this result.  Hot flashes, menopausal Patient has been on SSRI for 3 weeks and has noticed improvement. She's gone from preop watches a day to one. She would like further improvement. She is to come back in 4 weeks so we can recheck how she is doing on SSRI. We can add therapy for this if necessary.  MORBID OBESITY Patient with 4 pound weight loss. She's been writing or what she eats. She is exercising with water aerobics. She is very proud of herself and I am too.

## 2011-10-27 NOTE — Assessment & Plan Note (Signed)
Patient with chronic knee pain bilaterally. She has seen her orthopedic surgeon. He think she will need another knee replacement within 1-2 years. He is encouraging her to lose weight. She is doing well and her oxycodone 10 mg 3 times a day. She and I understand we will not increase this and she is going to get medication only from me. She was doing well and has started to exercise with water aerobics.

## 2011-10-27 NOTE — Assessment & Plan Note (Signed)
BP Readings from Last 3 Encounters:  10/27/11 135/88  09/29/11 145/93  09/09/11 131/93   patient blood pressure at goal. Continue to monitor.

## 2011-10-27 NOTE — Assessment & Plan Note (Signed)
Patient has been on SSRI for 3 weeks and has noticed improvement. She's gone from preop watches a day to one. She would like further improvement. She is to come back in 4 weeks so we can recheck how she is doing on SSRI. We can add therapy for this if necessary.

## 2011-10-27 NOTE — Assessment & Plan Note (Signed)
Now that she is not on the clonidine, she has not been losing her hair. She is satisfied with this result.

## 2011-11-28 ENCOUNTER — Ambulatory Visit: Payer: Self-pay | Admitting: Family Medicine

## 2011-12-01 ENCOUNTER — Emergency Department (HOSPITAL_COMMUNITY): Payer: Medicaid Other

## 2011-12-01 ENCOUNTER — Observation Stay (HOSPITAL_COMMUNITY)
Admission: EM | Admit: 2011-12-01 | Discharge: 2011-12-03 | Disposition: A | Discharge status: Home / Self Care | Payer: Medicaid Other | Attending: Family Medicine | Admitting: Family Medicine

## 2011-12-01 ENCOUNTER — Encounter (HOSPITAL_COMMUNITY): Payer: Self-pay

## 2011-12-01 DIAGNOSIS — G8929 Other chronic pain: Secondary | ICD-10-CM | POA: Insufficient documentation

## 2011-12-01 DIAGNOSIS — K089 Disorder of teeth and supporting structures, unspecified: Secondary | ICD-10-CM | POA: Insufficient documentation

## 2011-12-01 DIAGNOSIS — M549 Dorsalgia, unspecified: Secondary | ICD-10-CM | POA: Insufficient documentation

## 2011-12-01 DIAGNOSIS — R0609 Other forms of dyspnea: Secondary | ICD-10-CM

## 2011-12-01 DIAGNOSIS — R209 Unspecified disturbances of skin sensation: Secondary | ICD-10-CM | POA: Insufficient documentation

## 2011-12-01 DIAGNOSIS — M1710 Unilateral primary osteoarthritis, unspecified knee: Secondary | ICD-10-CM | POA: Diagnosis present

## 2011-12-01 DIAGNOSIS — E876 Hypokalemia: Secondary | ICD-10-CM | POA: Insufficient documentation

## 2011-12-01 DIAGNOSIS — K029 Dental caries, unspecified: Secondary | ICD-10-CM

## 2011-12-01 DIAGNOSIS — I10 Essential (primary) hypertension: Secondary | ICD-10-CM | POA: Insufficient documentation

## 2011-12-01 DIAGNOSIS — M1990 Unspecified osteoarthritis, unspecified site: Secondary | ICD-10-CM | POA: Insufficient documentation

## 2011-12-01 DIAGNOSIS — N951 Menopausal and female climacteric states: Secondary | ICD-10-CM | POA: Insufficient documentation

## 2011-12-01 DIAGNOSIS — R0602 Shortness of breath: Secondary | ICD-10-CM | POA: Insufficient documentation

## 2011-12-01 DIAGNOSIS — R000 Tachycardia, unspecified: Secondary | ICD-10-CM | POA: Insufficient documentation

## 2011-12-01 DIAGNOSIS — J45909 Unspecified asthma, uncomplicated: Secondary | ICD-10-CM | POA: Insufficient documentation

## 2011-12-01 DIAGNOSIS — R0789 Other chest pain: Principal | ICD-10-CM | POA: Insufficient documentation

## 2011-12-01 DIAGNOSIS — K0889 Other specified disorders of teeth and supporting structures: Secondary | ICD-10-CM

## 2011-12-01 DIAGNOSIS — M79609 Pain in unspecified limb: Secondary | ICD-10-CM | POA: Insufficient documentation

## 2011-12-01 DIAGNOSIS — K047 Periapical abscess without sinus: Secondary | ICD-10-CM | POA: Diagnosis present

## 2011-12-01 DIAGNOSIS — R0600 Dyspnea, unspecified: Secondary | ICD-10-CM

## 2011-12-01 DIAGNOSIS — M940 Chondrocostal junction syndrome [Tietze]: Secondary | ICD-10-CM

## 2011-12-01 HISTORY — DX: Other chronic pain: G89.29

## 2011-12-01 HISTORY — DX: Low back pain: M54.5

## 2011-12-01 HISTORY — DX: Angina pectoris, unspecified: I20.9

## 2011-12-01 HISTORY — DX: Bilateral primary osteoarthritis of knee: M17.0

## 2011-12-01 HISTORY — DX: Essential (primary) hypertension: I10

## 2011-12-01 HISTORY — DX: Prediabetes: R73.03

## 2011-12-01 HISTORY — DX: Dyspnea, unspecified: R06.00

## 2011-12-01 HISTORY — DX: Other forms of dyspnea: R06.09

## 2011-12-01 HISTORY — DX: Headache: R51

## 2011-12-01 HISTORY — DX: Low back pain, unspecified: M54.50

## 2011-12-01 HISTORY — DX: Unspecified osteoarthritis, unspecified site: M19.90

## 2011-12-01 LAB — BASIC METABOLIC PANEL
BUN: 8 mg/dL (ref 6–23)
Chloride: 98 mEq/L (ref 96–112)
GFR calc non Af Amer: >90 mL/min (ref >90)
Glucose, Bld: 108 mg/dL — ABNORMAL HIGH (ref 70–99)
Potassium: 3.9 mEq/L (ref 3.5–5.1)

## 2011-12-01 LAB — URINALYSIS, ROUTINE W REFLEX MICROSCOPIC
Nitrite: NEGATIVE
Specific Gravity, Urine: 1.023 (ref 1.005–1.030)
pH: 5.5 (ref 5.0–8.0)

## 2011-12-01 LAB — GLUCOSE, CAPILLARY
Glucose-Capillary: 127 mg/dL — ABNORMAL HIGH (ref 70–99)
Glucose-Capillary: 115 mg/dL — ABNORMAL HIGH (ref 70–99)

## 2011-12-01 LAB — RAPID URINE DRUG SCREEN, HOSP PERFORMED
Benzodiazepines: NOT DETECTED
Cocaine: NOT DETECTED
Opiates: POSITIVE — AB

## 2011-12-01 LAB — CBC
HCT: 41.5 % (ref 36.0–46.0)
Hemoglobin: 14.5 g/dL (ref 12.0–15.0)
MCHC: 34.9 g/dL (ref 30.0–36.0)

## 2011-12-01 MED ORDER — PENICILLIN V POTASSIUM 250 MG PO TABS
500.0000 mg | ORAL_TABLET | Freq: Once | ORAL | Status: AC
  Administered 2011-12-01: 500 mg via ORAL
  Filled 2011-12-01: qty 2

## 2011-12-01 MED ORDER — MORPHINE SULFATE 4 MG/ML IJ SOLN
4.0000 mg | Freq: Once | INTRAMUSCULAR | Status: AC
  Administered 2011-12-01: 4 mg via INTRAVENOUS
  Filled 2011-12-01: qty 1

## 2011-12-01 MED ORDER — OXYCODONE-ACETAMINOPHEN 5-325 MG PO TABS
1.0000 | ORAL_TABLET | Freq: Once | ORAL | Status: AC
  Administered 2011-12-01: 1 via ORAL
  Filled 2011-12-01: qty 1

## 2011-12-01 MED ORDER — ASPIRIN 81 MG PO CHEW
324.0000 mg | CHEWABLE_TABLET | Freq: Once | ORAL | Status: AC
  Administered 2011-12-01: 324 mg via ORAL
  Filled 2011-12-01: qty 4

## 2011-12-01 MED ORDER — OXYCODONE-ACETAMINOPHEN 5-325 MG PO TABS
2.0000 | ORAL_TABLET | Freq: Once | ORAL | Status: AC
  Administered 2011-12-01: 2 via ORAL
  Filled 2011-12-01: qty 2

## 2011-12-01 MED ORDER — ONDANSETRON HCL 4 MG/2ML IJ SOLN
4.0000 mg | Freq: Once | INTRAMUSCULAR | Status: AC
  Administered 2011-12-01: 4 mg via INTRAVENOUS
  Filled 2011-12-01: qty 2

## 2011-12-01 MED ORDER — ONDANSETRON 4 MG PO TBDP
4.0000 mg | ORAL_TABLET | Freq: Once | ORAL | Status: AC
  Administered 2011-12-01: 4 mg via ORAL
  Filled 2011-12-01: qty 1

## 2011-12-01 MED ORDER — SODIUM CHLORIDE 0.9 % IV BOLUS (SEPSIS)
1000.0000 mL | Freq: Once | INTRAVENOUS | Status: AC
  Administered 2011-12-01: 1000 mL via INTRAVENOUS

## 2011-12-01 NOTE — ED Notes (Signed)
cbg 115 

## 2011-12-01 NOTE — ED Notes (Signed)
Pt presents with 2 day h/o R upper tooth pain and jaw swelling; R arm and leg numbness and tingling.

## 2011-12-01 NOTE — ED Provider Notes (Signed)
History     CSN: [id number]  Arrival date & time 12/01/11  1346   First MD Initiated Contact with Patient 12/01/11 1604      Chief Complaint  Patient presents with  . Numbness    (Consider location/radiation/quality/duration/timing/severity/associated sxs/prior treatment) HPI  Patient presents to the ED with complaints of right side upper tooth pain and jaw swelling. She also is having right arm and leg sharp pains and tingling intermittently that are 10/10 when the pain "shoots". The patient denies being diabetic, but admits that she does not know for sure because she has not checked her sugars in years.  Tooth pain: the patient states that her teeth have been hurting for 3 days. The patient admits to having teeth pulled in the past after the same symptoms on the left side. She states that this feels a lot like that and that her face has been swollen. The pain is 10/10 ands he admits to taking oxycodone at home for pain and it not helping. The quality is sharp and throbbing.  Sharp pains: The patient has been having pains shooting from her patella tendon down medial side of her tib/fib but denies tingling and numbess. She also admits to having tingling in her left fingers. NO weakness, focal or generalized. She admits to being able to feel her fingers but that it feels weird. She denies having pain in her hand.   Pt is a chronic back pain patient.   Past Medical History  Diagnosis Date  . Asthma   . Hypertension   . Back pain, chronic     Past Surgical History  Procedure Date  . Tubal ligation   . Joint replacement     Family History  Problem Relation Age of Onset  . Diabetes Mother   . Diabetes Father   . Diabetes Sister   . Diabetes Brother     History  Substance Use Topics  . Smoking status: Never Smoker   . Smokeless tobacco: Never Used  . Alcohol Use: No    OB History    Grav Para Term Preterm Abortions TAB SAB Ect Mult Living                  Review  of Systems   HEENT: denies blurry vision or change in hearing PULMONARY: Denies difficulty breathing and SOB CARDIAC: denies chest pain or heart palpitations MUSCULOSKELETAL:  denies being unable to ambulate ABDOMEN AL: denies abdominal pain GU: denies loss of bowel or urinary control NEURO: denies generalized or focal weakness   Allergies  Review of patient's allergies indicates no known allergies.  Home Medications   Current Outpatient Rx  Name Route Sig Dispense Refill  . ALBUTEROL SULFATE HFA 108 (90 BASE) MCG/ACT IN AERS Inhalation Inhale 2 puffs into the lungs every 4 (four) hours as needed. Shortness of breath    . ASPIRIN EC 325 MG PO TBEC Oral Take 650 mg by mouth every 6 (six) hours as needed. For pain    . HYDROCHLOROTHIAZIDE 25 MG PO TABS Oral Take 25 mg by mouth daily.    Marland Kitchen LORATADINE 10 MG PO TABS Oral Take 10 mg by mouth daily.      Marland Kitchen NORTRIPTYLINE HCL 25 MG PO CAPS Oral Take 25 mg by mouth at bedtime.    . OXYCODONE HCL 10 MG PO TABS Oral Take 10 mg by mouth 3 (three) times daily.    . OXYCODONE HCL 10 MG PO TABS Oral Take 10 mg by  mouth 3 (three) times daily.    Marland Kitchen PAROXETINE HCL 20 MG PO TABS Oral Take 20 mg by mouth every morning.    Marland Kitchen TRAMADOL HCL 50 MG PO TABS Oral Take 50 mg by mouth 2 (two) times daily as needed. For pain      BP 144/82  Pulse 128  Temp(Src) 97.2 F (36.2 C) (Oral)  Resp 20  SpO2 97%  Physical Exam  Nursing note and vitals reviewed. Constitutional: She appears well-developed and well-nourished. No distress.  HENT:  Head: Normocephalic and atraumatic.  Eyes: Pupils are equal, round, and reactive to light.  Neck: Normal range of motion. Neck supple.  Cardiovascular: Normal rate and regular rhythm.   Pulmonary/Chest: Effort normal.  Abdominal: Soft.  Musculoskeletal:       Right hand: Normal. She exhibits normal range of motion, no tenderness, no bony tenderness, normal capillary refill, no deformity, no laceration and no swelling.  normal sensation (pt admits to tingling in her hand however is able to aprpeciate light palpation) noted. Normal strength noted.       Right lower leg: She exhibits no tenderness (no tenderness to palpation. Shooting pains are intermittent.), no swelling, no edema and no deformity.  Neurological: She is alert.  Skin: Skin is warm and dry.    ED Course  Procedures (including critical care time)  Labs Reviewed  GLUCOSE, CAPILLARY - Abnormal; Notable for the following:    Glucose-Capillary 127 (*)    All other components within normal limits  CBC - Abnormal; Notable for the following:    RBC 5.20 (*)    All other components within normal limits  BASIC METABOLIC PANEL - Abnormal; Notable for the following:    Glucose, Bld 108 (*)    All other components within normal limits  GLUCOSE, CAPILLARY - Abnormal; Notable for the following:    Glucose-Capillary 115 (*)    All other components within normal limits  URINALYSIS, ROUTINE W REFLEX MICROSCOPIC - Abnormal; Notable for the following:    Color, Urine AMBER (*) BIOCHEMICALS MAY BE AFFECTED BY COLOR   APPearance CLOUDY (*)    Bilirubin Urine LARGE (*)    Leukocytes, UA SMALL (*)    All other components within normal limits  URINE RAPID DRUG SCREEN (HOSP PERFORMED) - Abnormal; Notable for the following:    Opiates POSITIVE (*)    All other components within normal limits  URINE MICROSCOPIC-ADD ON - Abnormal; Notable for the following:    Squamous Epithelial / LPF FEW (*)    All other components within normal limits  TROPONIN I  D-DIMER, QUANTITATIVE   Dg Chest 2 View  12/01/2011  *RADIOLOGY REPORT*  Clinical Data: Chest pain and shortness of breath.  CHEST - 2 VIEW  Comparison: None  Findings: The heart is mildly enlarged.  There is mild tortuosity of the thoracic aorta.  Low lung volumes with vascular crowding and atelectasis.  Elevation of the right hemidiaphragm is noted.  No pleural effusion or focal infiltrate.  The bony thorax is  intact.  IMPRESSION: Streaky bibasilar atelectasis but no infiltrates, edema or effusions.  Original Report Authenticated By: P. Loralie Champagne, M.D.     1. Tachycardia   2. Pain, dental   3. Chest pain       MDM    5:49 PM: Patient complaining of a heavy chest pressure. She continues to be tachycardic. I have discussed patient with Dr. Anitra Lauth and we can not r/o DVT by criteria therefore we have ordered  a d-dimer.    Date: 12/01/2011  Rate: 118  Rhythm: sinus tachycardia  QRS Axis: normal  Intervals: normal  ST/T Wave abnormalities: ST depressions diffusely  Conduction Disutrbances:nonspecific intraventricular conduction delay  Narrative Interpretation:   Old EKG Reviewed: none available    9:05 PM: patient d-dimer and Troponin are both negative. Patient continues to have tachycardia and has received 1 L of fluids with normal   Due to patients continued tachycardia, I have asked Family Practice to come see the patient and request that they admit her. Dr. Sondra Come has agreed to come see the patient in the ER.  Patient remained stable during her ED visit.  Dorthula Matas, PA 12/01/11 2323

## 2011-12-02 ENCOUNTER — Observation Stay (HOSPITAL_COMMUNITY): Payer: Medicaid Other

## 2011-12-02 ENCOUNTER — Encounter (HOSPITAL_COMMUNITY): Payer: Self-pay | Admitting: General Practice

## 2011-12-02 DIAGNOSIS — R51 Headache: Secondary | ICD-10-CM

## 2011-12-02 DIAGNOSIS — R072 Precordial pain: Secondary | ICD-10-CM

## 2011-12-02 HISTORY — DX: Headache: R51

## 2011-12-02 LAB — BASIC METABOLIC PANEL
BUN: 8 mg/dL (ref 6–23)
GFR calc Af Amer: >90 mL/min (ref >90)
GFR calc non Af Amer: >90 mL/min (ref >90)
Potassium: 2.9 mEq/L — ABNORMAL LOW (ref 3.5–5.1)

## 2011-12-02 LAB — CARDIAC PANEL(CRET KIN+CKTOT+MB+TROPI)
CK, MB: 1.7 ng/mL (ref 0.3–4.0)
CK, MB: 3.3 ng/mL (ref 0.3–4.0)
Relative Index: 2.4 (ref 0.0–2.5)
Total CK: 97 U/L (ref 7–177)
Total CK: 140 U/L (ref 7–177)

## 2011-12-02 LAB — CBC
Hemoglobin: 13.2 g/dL (ref 12.0–15.0)
Hemoglobin: 11.7 g/dL — ABNORMAL LOW (ref 12.0–15.0)
MCHC: 33.2 g/dL (ref 30.0–36.0)
MCHC: 32.9 g/dL (ref 30.0–36.0)
RBC: 4.84 MIL/uL (ref 3.87–5.11)
RDW: 15.1 % (ref 11.5–15.5)
WBC: 9.3 10*3/uL (ref 4.0–10.5)

## 2011-12-02 LAB — CREATININE, SERUM
Creatinine, Ser: 0.55 mg/dL (ref 0.50–1.10)
GFR calc Af Amer: >90 mL/min (ref >90)
GFR calc non Af Amer: >90 mL/min (ref >90)

## 2011-12-02 LAB — TSH: TSH: 2.265 u[IU]/mL (ref 0.350–4.500)

## 2011-12-02 MED ORDER — HYDROCHLOROTHIAZIDE 25 MG PO TABS
25.0000 mg | ORAL_TABLET | Freq: Every day | ORAL | Status: DC
  Administered 2011-12-02 – 2011-12-03 (×2): 25 mg via ORAL
  Filled 2011-12-02 (×2): qty 1

## 2011-12-02 MED ORDER — NORTRIPTYLINE HCL 25 MG PO CAPS
25.0000 mg | ORAL_CAPSULE | Freq: Every day | ORAL | Status: DC
  Administered 2011-12-02 (×2): 25 mg via ORAL
  Filled 2011-12-02 (×3): qty 1

## 2011-12-02 MED ORDER — LORATADINE 10 MG PO TABS
10.0000 mg | ORAL_TABLET | Freq: Every day | ORAL | Status: DC
  Administered 2011-12-02 – 2011-12-03 (×2): 10 mg via ORAL
  Filled 2011-12-02 (×3): qty 1

## 2011-12-02 MED ORDER — HEPARIN SODIUM (PORCINE) 5000 UNIT/ML IJ SOLN
5000.0000 [IU] | Freq: Three times a day (TID) | INTRAMUSCULAR | Status: DC
  Administered 2011-12-02 – 2011-12-03 (×3): 5000 [IU] via SUBCUTANEOUS
  Filled 2011-12-02 (×6): qty 1

## 2011-12-02 MED ORDER — HYDROCHLOROTHIAZIDE 25 MG PO TABS: 25.0000 mg | ORAL_TABLET | Freq: Every day | ORAL | Status: DC

## 2011-12-02 MED ORDER — LABETALOL HCL 5 MG/ML IV SOLN: 5.0000 mg | INTRAVENOUS | Status: DC | PRN

## 2011-12-02 MED ORDER — NORTRIPTYLINE HCL 25 MG PO CAPS: 25.0000 mg | ORAL_CAPSULE | Freq: Every day | ORAL | Status: DC

## 2011-12-02 MED ORDER — MORPHINE SULFATE 4 MG/ML IJ SOLN
4.0000 mg | INTRAMUSCULAR | Status: DC | PRN
  Administered 2011-12-02 (×2): 4 mg via INTRAVENOUS
  Filled 2011-12-02 (×3): qty 1

## 2011-12-02 MED ORDER — SODIUM CHLORIDE 0.9 % IJ SOLN
3.0000 mL | Freq: Two times a day (BID) | INTRAMUSCULAR | Status: DC
  Administered 2011-12-03: 3 mL via INTRAVENOUS

## 2011-12-02 MED ORDER — POTASSIUM CHLORIDE CRYS ER 10 MEQ PO TBCR
10.0000 meq | EXTENDED_RELEASE_TABLET | Freq: Once | ORAL | Status: DC
  Filled 2011-12-02: qty 1

## 2011-12-02 MED ORDER — SODIUM CHLORIDE 0.45 % IV SOLN
INTRAVENOUS | Status: DC
  Administered 2011-12-02 – 2011-12-03 (×5): via INTRAVENOUS

## 2011-12-02 MED ORDER — ACETAMINOPHEN 325 MG PO TABS: 650.0000 mg | ORAL_TABLET | Freq: Once | ORAL | Status: DC

## 2011-12-02 MED ORDER — POTASSIUM CHLORIDE CRYS ER 20 MEQ PO TBCR
20.0000 meq | EXTENDED_RELEASE_TABLET | Freq: Three times a day (TID) | ORAL | Status: DC
  Administered 2011-12-02 (×3): 20 meq via ORAL
  Filled 2011-12-02 (×5): qty 1

## 2011-12-02 MED ORDER — PENICILLIN V POTASSIUM 250 MG PO TABS
500.0000 mg | ORAL_TABLET | Freq: Four times a day (QID) | ORAL | Status: DC
  Administered 2011-12-02 – 2011-12-03 (×4): 500 mg via ORAL
  Filled 2011-12-02 (×3): qty 2
  Filled 2011-12-02: qty 1
  Filled 2011-12-02: qty 2
  Filled 2011-12-02: qty 1
  Filled 2011-12-02: qty 2
  Filled 2011-12-02: qty 1
  Filled 2011-12-02: qty 2

## 2011-12-02 MED ORDER — OXYCODONE HCL 5 MG PO TABS
10.0000 mg | ORAL_TABLET | Freq: Three times a day (TID) | ORAL | Status: DC
  Administered 2011-12-02 – 2011-12-03 (×4): 10 mg via ORAL
  Filled 2011-12-02 (×2): qty 2
  Filled 2011-12-02: qty 1
  Filled 2011-12-02 (×2): qty 2

## 2011-12-02 MED ORDER — ASPIRIN EC 81 MG PO TBEC
81.0000 mg | DELAYED_RELEASE_TABLET | Freq: Every day | ORAL | Status: DC
  Administered 2011-12-02 – 2011-12-03 (×2): 81 mg via ORAL
  Filled 2011-12-02 (×2): qty 1

## 2011-12-02 MED ORDER — PAROXETINE HCL 20 MG PO TABS
20.0000 mg | ORAL_TABLET | Freq: Every day | ORAL | Status: DC
  Administered 2011-12-02 – 2011-12-03 (×2): 20 mg via ORAL
  Filled 2011-12-02 (×2): qty 1

## 2011-12-02 MED ORDER — KETOROLAC TROMETHAMINE 30 MG/ML IJ SOLN
30.0000 mg | Freq: Three times a day (TID) | INTRAMUSCULAR | Status: DC
  Administered 2011-12-02 – 2011-12-03 (×3): 30 mg via INTRAVENOUS
  Filled 2011-12-02 (×9): qty 1

## 2011-12-02 MED ORDER — OXYCODONE HCL 10 MG PO TABS: 10.0000 mg | ORAL_TABLET | Freq: Three times a day (TID) | ORAL | Status: DC

## 2011-12-02 MED ORDER — DIPHENHYDRAMINE HCL 25 MG PO CAPS
50.0000 mg | ORAL_CAPSULE | Freq: Once | ORAL | Status: AC
  Administered 2011-12-02: 50 mg via ORAL
  Filled 2011-12-02: qty 2

## 2011-12-02 MED ORDER — KETOROLAC TROMETHAMINE 60 MG/2ML IM SOLN: 60.0000 mg | Freq: Three times a day (TID) | INTRAMUSCULAR | Status: DC

## 2011-12-02 NOTE — Progress Notes (Signed)
*  PRELIMINARY RESULTS* Echocardiogram 2D Echocardiogram has been performed.  Colleen Olson 12/02/2011, 2:17 PM

## 2011-12-02 NOTE — ED Notes (Signed)
Attempted to call report x 1.  Given name and number and nurse to call back.  

## 2011-12-02 NOTE — Progress Notes (Signed)
Pt complained of chest pain during inspiration. Pt stated her pain was a 8/10 during inspiration. 2L of oxygen was applied to the pt and 1 nitro was given to the pt. Pt's SBP was elevated in the 170's and her Hr was elevated in the 130's (sinus tach).  The 1 nitro bought the pt's chest pain down to a 5. However pt refused another nitro because she was starting to develop a head ache. Md made aware new order received for Tylenol. Will cont to monitor pt.

## 2011-12-02 NOTE — Progress Notes (Signed)
   CARE MANAGEMENT NOTE 12/02/2011  Patient:  Colleen Olson, Colleen Olson   Account Number:  [id number]  Date Initiated:  12/02/2011  Documentation initiated by:  Marvelous Bouwens  Subjective/Objective Assessment:   Referral for medication assistance as pt is self pay     Action/Plan:   Met with pt who is pt of [facility] family practice and has an orange card to assist with medications. She states that she gets meds at [facility] and [facility] outpatient pharmacy.   Anticipated DC Date:  12/02/2011   Anticipated DC Plan:  HOME/SELF CARE         Choice offered to / List presented to:             Status of service:  In process, will continue to follow Medicare Important Message given?   (If response is "NO", the following Medicare IM given date fields will be blank) Date Medicare IM given:   Date Additional Medicare IM given:    Discharge Disposition:  HOME/SELF CARE  Per UR Regulation:    If discussed at Long Length of Stay Meetings, dates discussed:    Comments:

## 2011-12-02 NOTE — Progress Notes (Signed)
Clinical Child psychotherapist (CSW) received a referral for advanced directives however briefly spoke with pt as pt stated she had a headache and was not interested in advanced directives or going through the packet. Pt did ask for CSW to leave the packet.   CSW unable to fully complete assessment as pt was on the phone, did not give CSW any information and did not want to discuss the advanced directives however accepted the packet. CSW available if pt would like additional information or packet notarized. CSW signing off.  Theresia Bough, MSW, Theresia Majors [phone]

## 2011-12-02 NOTE — H&P (Signed)
Colleen Olson is an 51 y.o. female.    Chief Complaint: RT tooth pain, SOB, chest pressure, and RT lower extremity pain  HPI:   Patient presents to ED with multiple complaints.  She has a history of chronic pain, but today she complains of RT side tooth pain and swelling that has been going on for 3 days.  Patient has not seen a dentist for awhile, but had similar symptoms on the LT side and has had teeth pulled in the past.  Describes pain as throbbing and 10/10 today.  She tried taking home Oxycodone for pain, but it did not help.    Patient also complains of shooting pains in her RT lower extremity that started today.  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  She denies any recent falls.  Pain is located anterior shin and comes and goes.  Pain not relieved by Oxycodone.  Patient is not a diabetic, but has a strong family history.  She denies any numbness or tingling of lower extremities, but does complain of RT hand numbness.  Denies any weakness.  Chest Pressure: patient complains of LT sided chest pain that also started today at rest.  CP is intermittent and is getting progressively worse despite getting Morphine in ED.  Patient says chest is tender when she presses or touches it.  CP is associated with SOB and cough.  CP not aggravated by eating food.  Denies any associated fever, chills, nausea vomiting, or diaphoresis.  Denies any pain that radiates to shoulder or jaw.  Past Medical History  Diagnosis Date  . Asthma   . Hypertension   . Back pain, chronic     Past Surgical History  Procedure Date  . Tubal ligation   . Joint replacement     Family History  Problem Relation Age of Onset  . Diabetes Mother   . Diabetes Father   . Diabetes Sister   . Diabetes Brother    Social History:  reports that she has never smoked. She has never used smokeless tobacco. She reports that she does not drink alcohol or use illicit  drugs.  Allergies: No Known Allergies   Results for orders placed during the hospital encounter of 12/01/11 (from the past 48 hour(s))  GLUCOSE, CAPILLARY     Status: Abnormal   Collection Time   12/01/11  5:09 PM      Component Value Range Comment   Glucose-Capillary 127 (*) 70 - 99 (mg/dL)   GLUCOSE, CAPILLARY     Status: Abnormal   Collection Time   12/01/11  6:00 PM      Component Value Range Comment   Glucose-Capillary 115 (*) 70 - 99 (mg/dL)   CBC     Status: Abnormal   Collection Time   12/01/11  6:22 PM      Component Value Range Comment   WBC 7.3  4.0 - 10.5 (K/uL)    RBC 5.20 (*) 3.87 - 5.11 (MIL/uL)    Hemoglobin 14.5  12.0 - 15.0 (g/dL)    HCT 16.1  09.6 - 04.5 (%)    MCV 79.8  78.0 - 100.0 (fL)    MCH 27.9  26.0 - 34.0 (pg)    MCHC 34.9  30.0 - 36.0 (g/dL)    RDW 40.9  81.1 - 91.4 (%)    Platelets 209  150 - 400 (K/uL)   BASIC METABOLIC PANEL     Status:  Abnormal   Collection Time   12/01/11  6:22 PM      Component Value Range Comment   Sodium 137  135 - 145 (mEq/L)    Potassium 3.9  3.5 - 5.1 (mEq/L)    Chloride 98  96 - 112 (mEq/L)    CO2 27  19 - 32 (mEq/L)    Glucose, Bld 108 (*) 70 - 99 (mg/dL)    BUN 8  6 - 23 (mg/dL)    Creatinine, Ser 1.61  0.50 - 1.10 (mg/dL)    Calcium 9.4  8.4 - 10.5 (mg/dL)    GFR calc non Af Amer >90  >90 (mL/min)    GFR calc Af Amer >90  >90 (mL/min)   D-DIMER, QUANTITATIVE     Status: Normal   Collection Time   12/01/11  6:23 PM      Component Value Range Comment   D-Dimer, Quant 0.23  0.00 - 0.48 (ug/mL-FEU)   TROPONIN I     Status: Normal   Collection Time   12/01/11  6:34 PM      Component Value Range Comment   Troponin I <0.30  <0.30 (ng/mL)   URINALYSIS, ROUTINE W REFLEX MICROSCOPIC     Status: Abnormal   Collection Time   12/01/11  9:36 PM      Component Value Range Comment   Color, Urine AMBER (*) YELLOW  BIOCHEMICALS MAY BE AFFECTED BY COLOR   APPearance CLOUDY (*) CLEAR     Specific Gravity, Urine 1.023  1.005 - 1.030      pH 5.5  5.0 - 8.0     Glucose, UA NEGATIVE  NEGATIVE (mg/dL)    Hgb urine dipstick NEGATIVE  NEGATIVE     Bilirubin Urine LARGE (*) NEGATIVE     Ketones, ur NEGATIVE  NEGATIVE (mg/dL)    Protein, ur NEGATIVE  NEGATIVE (mg/dL)    Urobilinogen, UA 1.0  0.0 - 1.0 (mg/dL)    Nitrite NEGATIVE  NEGATIVE     Leukocytes, UA SMALL (*) NEGATIVE    URINE RAPID DRUG SCREEN (HOSP PERFORMED)     Status: Abnormal   Collection Time   12/01/11  9:36 PM      Component Value Range Comment   Opiates POSITIVE (*) NONE DETECTED     Cocaine NONE DETECTED  NONE DETECTED     Benzodiazepines NONE DETECTED  NONE DETECTED     Amphetamines NONE DETECTED  NONE DETECTED     Tetrahydrocannabinol NONE DETECTED  NONE DETECTED     Barbiturates NONE DETECTED  NONE DETECTED    URINE MICROSCOPIC-ADD ON     Status: Abnormal   Collection Time   12/01/11  9:36 PM      Component Value Range Comment   Squamous Epithelial / LPF FEW (*) RARE     WBC, UA 0-2  <3 (WBC/hpf)    Bacteria, UA RARE  RARE     Dg Chest 2 View  12/01/2011  *RADIOLOGY REPORT*  Clinical Data: Chest pain and shortness of breath.  CHEST - 2 VIEW  Comparison: None  Findings: The heart is mildly enlarged.  There is mild tortuosity of the thoracic aorta.  Low lung volumes with vascular crowding and atelectasis.  Elevation of the right hemidiaphragm is noted.  No pleural effusion or focal infiltrate.  The bony thorax is intact.  IMPRESSION: Streaky bibasilar atelectasis but no infiltrates, edema or effusions.  Original Report Authenticated By: P. Loralie Champagne, M.D.    ROS  Per  HPI  Blood pressure 137/76, pulse 125, temperature 97.2 F (36.2 C), temperature source Oral, resp. rate 22, SpO2 98.00%.  Physical Exam  Constitutional: No distress.       Morbidly obese  HENT:  Head: Normocephalic and atraumatic.       Unable to visualize oropharynx well due to R mouth and tooth pain; R cheek swollen, warm, and tender on palpation, no erythema  Neck: Normal  range of motion. Neck supple.  Cardiovascular: Normal heart sounds.   No murmur heard.      tachycardic  Respiratory: Effort normal and breath sounds normal. No respiratory distress. She has no wheezes. She has no rales. She exhibits tenderness.  GI: Soft. Bowel sounds are normal. She exhibits no distension. There is no tenderness. There is no rebound and no guarding.  Musculoskeletal: She exhibits tenderness. She exhibits no edema.       Decreased strength in all extremities due to poor effort, normal ROM of all extremities; sensation intact; pain on tenderness on palpation of RT anterior lower extremity  Lymphadenopathy:    She has no cervical adenopathy.  Neurological: She is alert. A cranial nerve deficit is present.  Skin: Skin is warm. No rash noted. No erythema.     Assessment/Plan # Chest Pressure: CP likely costochondritis based on tenderness on palpation on exam - Will cycle cardiac enzymes to rule ACS - Repeat EKG in AM - Treat pain with Toradol 30 Q 8 schedule and Morphine Q 3 PRN  # Tachycardia: D-dimer Negative.  Tachy likely secondary to pain and dehydration.  Per previous office visits, patient's HR runs 106-130. - Order 1 L IVF bolus (2 total) and then MIVF @ 150 cc/hr - Repeat EKG in AM - Monitor on telemetry  # Tooth pain: patient given one dose PCN in ED - Will consider starting Clindamycin in AM - Keep NPO overnight due to pain and increase diet as tolerated - Toradol and Morphine PRN pain  # RT leg pain: Negative calf tenderness on exam.  Pain likely acute on chronic secondary to DJD.  She may have a component of Fibromyalgia as well. - Pain medications as above - Consider LE dopplers in AM if pain acutely worsens, patient is morbidly obese and immobile so she is at risk for DVT  # Asthma, intermittent: stable at this time - Will hold off on home Albuterol meds for now secondary to tachycardia - Maintain O2 sats > 92  # Hypertension: BP well controlled -  Continue home medication HCTZ 25 mg daily  # Hot flashes, menopause: Continue Pamelor and Paxil per home regimen  # DJD: See above assessment for pain management - Hopefully, will be able to transition to home pain medications tomorrow  # FEN/GI: NPO overnight due to tooth pain, MIVF  # PPX: Heparin SQ  # DISPO: pending clinical improvement  DE LA CRUZ,Crisanto Nied 12/02/2011, 12:17 AM

## 2011-12-02 NOTE — ED Notes (Signed)
Spoke with admitting MD about tachycardia, states need for 2 L NS and maintenance fluids.  Pt has hx of chronic tachycardia in office visits.

## 2011-12-02 NOTE — H&P (Signed)
FMTS Attending Admission Note: Colleen Lecount MD [phone] pager office [phone] I  have seen and examined this patient, reviewed their chart. I have discussed this patient with the resident. I agree with the resident's findings, assessment and care plan. 

## 2011-12-02 NOTE — Evaluation (Signed)
Physical Therapy Evaluation Patient Details Name: Colleen Olson MRN: [id number] DOB: Mar 09, 1961 Today's Date: 12/02/2011 Time: 4098-1191 PT Time Calculation (min): 43 min  PT Assessment / Plan / Recommendation Clinical Impression  pt adm with multimple problems incl. ?mouth abscess and chest pain worsening with inspiration as well as R leg pain that is managed with pain meds.  Pt is functionally at baseline if limited somewhat. Nursing to supervise pt ambulating in halls.  No further PT needs.    PT Assessment  Patent does not need any further PT services    Follow Up Recommendations  No PT follow up    Equipment Recommendations  None recommended by PT    Frequency      Precautions / Restrictions Precautions Precautions: Fall   Pertinent Vitals/Pain Orthostatics   Lying--148/81   Sitting--134/85   Standing 104-116/70-81    HR 125-130 with exertion      Mobility  Bed Mobility Bed Mobility: Supine to Sit;Sit to Supine;Sitting - Scoot to Edge of Bed Supine to Sit: 6: Modified independent (Device/Increase time) Sitting - Scoot to Edge of Bed: 6: Modified independent (Device/Increase time) Sit to Supine: 6: Modified independent (Device/Increase time) Details for Bed Mobility Assistance: no cuing or assistance needed, but mobility was a Human resources officer Transfers: Sit to Stand;Stand to Sit Sit to Stand: 5: Supervision;From bed Stand to Sit: 5: Supervision;To bed Details for Transfer Assistance: no cuing  or assistance needed Ambulation/Gait Ambulation/Gait Assistance: 6: Modified independent (Device/Increase time) Assistive device: Straight cane Ambulation/Gait Assistance Details: tentative gait with use of the std cane.  Antalgic looking gait, but generally steady Gait Pattern: Step-through pattern Stairs: No    Exercises     PT Goals Acute Rehab PT Goals PT Goal Formulation: With patient  Visit Information  Last PT Received On: 12/02/11 Assistance Needed: +1      Subjective Data  Subjective: This area popped and my mouth filled with blood today Patient Stated Goal: Back home independent   Prior Functioning  Home Living Lives With: Daughter Available Help at Discharge: Family Type of Home: Apartment Home Access: Stairs to enter [employer] of Steps: 13 Entrance Stairs-Rails: Right Home Layout: One level;Other (Comment) Bathroom Shower/Tub: Tub/shower unit;Curtain Bathroom Toilet: Standard Home Adaptive Equipment: Bedside commode/3-in-1;Straight cane;Shower chair with back;Walker - rolling Prior Function Level of Independence: Independent with assistive device(s) Able to Take Stairs?: Yes Driving: No Communication Communication: No difficulties    Cognition  Overall Cognitive Status: Appears within functional limits for tasks assessed/performed Arousal/Alertness: Awake/alert Orientation Level: Oriented X4 / Intact Behavior During Session: [facility] for tasks performed    Extremity/Trunk Assessment Right Upper Extremity Assessment RUE ROM/Strength/Tone: Within functional levels Left Upper Extremity Assessment LUE ROM/Strength/Tone: Within functional levels Right Lower Extremity Assessment RLE ROM/Strength/Tone: [facility] for tasks assessed Left Lower Extremity Assessment LLE ROM/Strength/Tone: WFL for tasks assessed (bil generally weak, especially proximally) Trunk Assessment Trunk Assessment: Normal   Balance Balance Balance Assessed: No  End of Session PT - End of Session Activity Tolerance: Patient tolerated treatment well Patient left: in bed;with call bell/phone within reach Nurse Communication: Mobility status   Niemah Schwebke, Eliseo Gum 12/02/2011, 4:22 PM  12/02/2011  [place] Bing, PT [phone] (pager)

## 2011-12-02 NOTE — Progress Notes (Signed)
Pt still feels itchy.  Pt  received a sponge bath. Md made aware. New order received to administer pt's homes dose of Claritin.  Will cont to monitor pt.

## 2011-12-02 NOTE — Progress Notes (Signed)
Please see orthostatic vital signs in PT's note

## 2011-12-02 NOTE — Progress Notes (Signed)
Pt. stated she felt very itchy. Pt stated her itchiness began after she had the nitro tablet. However, her itchiness  has gotten progressively worse. No hives were noted, however,  pt's lower extremities are warm to touch and have a red tent. Md made aware new order received for benadryl. Will cont to monitor pt.

## 2011-12-02 NOTE — Progress Notes (Signed)
Pt called me to the room with complaints of bleeding. Pt had a moderate amount of blood from her mouth, MD notified and came to room to assess. Will cont to monitor

## 2011-12-02 NOTE — Progress Notes (Signed)
Daily Progress Note Colleen Olson. Clinton Sawyer, M.D., M.B.A  Family Medicine PGY-1 Pager [phone]  Subjective: 1. Chest Pain - In her chest 9/10, initially improved with nitro and ASA, has recurred this morning, happens when at rest and activity  2. Oral Pain - Right cheek and gum, not improved overnight, has difficulty swallowing pills, but would like to eat regular diet.   3. Numbness and tingling in right upper extremity - no changes   4. Shortness of breath - occurs with any activity, also occurs when lying supine, wakes at night gasping   Objective: Vital signs in last 24 hours: Temp:  [97.2 F (36.2 C)-99.2 F (37.3 C)] 98.6 F (37 C) (05/03 0800) Pulse Rate:  [98-130] 115  (05/03 0800) Resp:  [18-22] 18  (05/03 0800) BP: (114-180)/(70-88) 130/77 mmHg (05/03 1141) SpO2:  [94 %-100 %] 96 % (05/03 0800) Weight:  [292 lb (132.45 kg)] 292 lb (132.45 kg) (05/03 0045) Weight change:  Last BM Date: 11/30/11  Physical Exam: Gen: alert, awake, mildly distressed, morbidly obese HEENT: induration of right cheek, firm tender nodule under right zygomatic arch, multiple caries, no erythema or tactile warmth, no drainage  CV: RRR, chest tenderness along sternum Lungs: CTA-B Ab: morbidly obese, cannot assess pain Extremities: dependent edema but lots of adipose tissue, non-tender   Lab Results:  Basename 12/02/11 0510 12/02/11 0117  WBC 9.6 9.3  HGB 11.7* 13.2  HCT 35.6* 39.7  PLT 170 186   BMET  Basename 12/02/11 0510 12/02/11 0117 12/01/11 1822  NA 136 -- 137  K 2.9* -- 3.9  CL 101 -- 98  CO2 25 -- 27  GLUCOSE 94 -- 108*  BUN 8 -- 8  CREATININE 0.61 0.55 --  CALCIUM 8.4 -- 9.4    Studies/Results: Dg Orthopantogram  12/02/2011  *RADIOLOGY REPORT*  Clinical Data: Right-sided dental pain and right facial swelling.  [facility] 12/02/2011:  Comparison: None.  Findings: No peri apical lucencies involving any of the remaining teeth to suggest abscess.  Caries are  suspected in teeth numbers 20, 4.  Temporomandibular joints intact.  No osseous abnormalities involving the mandible or maxilla.  IMPRESSION: No evidence of dental abscess.  Probable caries involving teeth numbers 20, 4.  Original Report Authenticated By: Arnell Sieving, M.D.   Dg Chest 2 View  12/01/2011  *RADIOLOGY REPORT*  Clinical Data: Chest pain and shortness of breath.  CHEST - 2 VIEW  Comparison: None  Findings: The heart is mildly enlarged.  There is mild tortuosity of the thoracic aorta.  Low lung volumes with vascular crowding and atelectasis.  Elevation of the right hemidiaphragm is noted.  No pleural effusion or focal infiltrate.  The bony thorax is intact.  IMPRESSION: Streaky bibasilar atelectasis but no infiltrates, edema or effusions.  Original Report Authenticated By: P. Loralie Champagne, M.D.   CBG (last 3)   Basename 12/01/11 1800 12/01/11 1709  GLUCAP 115* 127*    Cardiac Enzymes:  Lab 12/02/11 0923 12/02/11 0117 12/01/11 1834  CKTOTAL 115 97 --  CKMB 2.9 1.7 --  CKMBINDEX -- -- --  TROPONINI <0.30 <0.30 <0.30      Medications: I have reviewed the patient's current medications.  Assessment/Plan: # Chest Pressure: CP likely costochondritis based on tenderness on palpation on exam & negative troponin & negative d dimer - this is likely part of large pain syndrome, use home med   # Tachycardia: D-dimer Negative. Tachy likely secondary to pain and dehydration. Per previous office visits, patient's HR runs  106-130.  - no evidence of DVT; obtain orthostatic vitals to work-up dizziness   # Tooth pain: patient given one dose PCN in ED  - start PCN and obtain imaging to r/o abscess   # RT leg pain: Negative calf tenderness on exam. Pain likely acute on chronic secondary to DJD. She may have a component of Fibromyalgia as well.  - Pain medications as above  - Consider LE dopplers in AM if pain acutely worsens, patient is morbidly obese and immobile so she is at risk  for DVT   # Respiratory -  Asthma, intermittent: stable at this time; does not need to be on O2; but claims to have SOB - Will hold off on home Albuterol meds for now secondary to tachycardia  - Maintain O2 sats > 92  - obtain echocardiogram to r/o CHF   # Hypertension: BP well controlled  - Continue home medication HCTZ 25 mg daily   # Hot flashes, menopause: Continue Pamelor and Paxil per home regimen   # DJD: See above assessment for pain management  - start home oxycodone 10 mg TID  # FEN/GI: patient requesting full meals, but also states she cannot swallow pills, therefore start with dysphagia diet;, MIVF   # PPX: Heparin SQ   # DISPO: have PT assess to make sure she is safe to go home        LOS: 1 day   Mat Carne 12/02/2011, 2:16 PM

## 2011-12-02 NOTE — Progress Notes (Signed)
Pt was told she had an order for tylenol that could help with her head ache. Pt stated she did not need any pain medication at this time. Will cont to monitor pt.

## 2011-12-03 DIAGNOSIS — R000 Tachycardia, unspecified: Secondary | ICD-10-CM | POA: Diagnosis present

## 2011-12-03 DIAGNOSIS — R0789 Other chest pain: Secondary | ICD-10-CM | POA: Diagnosis present

## 2011-12-03 DIAGNOSIS — K047 Periapical abscess without sinus: Secondary | ICD-10-CM | POA: Diagnosis present

## 2011-12-03 LAB — BASIC METABOLIC PANEL
BUN: 5 mg/dL — ABNORMAL LOW (ref 6–23)
GFR calc non Af Amer: >90 mL/min (ref >90)
Glucose, Bld: 143 mg/dL — ABNORMAL HIGH (ref 70–99)
Potassium: 3.1 mEq/L — ABNORMAL LOW (ref 3.5–5.1)

## 2011-12-03 LAB — CARDIAC PANEL(CRET KIN+CKTOT+MB+TROPI)
CK, MB: 4.1 ng/mL — ABNORMAL HIGH (ref 0.3–4.0)
CK, MB: 4.9 ng/mL — ABNORMAL HIGH (ref 0.3–4.0)
Total CK: 164 U/L (ref 7–177)
Troponin I: <0.3 ng/mL (ref <0.30)

## 2011-12-03 MED ORDER — PENICILLIN V POTASSIUM 500 MG PO TABS: 500.0000 mg | ORAL_TABLET | Freq: Four times a day (QID) | ORAL | Status: DC

## 2011-12-03 MED ORDER — POTASSIUM CHLORIDE CRYS ER 10 MEQ PO TBCR
40.0000 meq | EXTENDED_RELEASE_TABLET | Freq: Three times a day (TID) | ORAL | Status: DC
  Administered 2011-12-03: 40 meq via ORAL
  Filled 2011-12-03 (×2): qty 4

## 2011-12-03 MED ORDER — PENICILLIN V POTASSIUM 500 MG PO TABS: 500.0000 mg | ORAL_TABLET | Freq: Four times a day (QID) | ORAL | Status: AC

## 2011-12-03 MED ORDER — POTASSIUM CHLORIDE CRYS ER 20 MEQ PO TBCR: 20.0000 meq | EXTENDED_RELEASE_TABLET | Freq: Three times a day (TID) | ORAL | Status: DC

## 2011-12-03 NOTE — Progress Notes (Signed)
FMTS Attending Daily Note: Geneveive Furness MD [phone] pager office [phone] I have discussed this patient with the resident and reviewed the assessment and plan as documented above. I agree wit the resident's findings and plan.  

## 2011-12-03 NOTE — Discharge Summary (Signed)
Physician Discharge Summary  Patient ID: Colleen Olson MRN: [id number] DOB/AGE: 1961/06/16 51 y.o.  Admit date: 12/01/2011 Discharge date: 12/03/2011  PCP: Ellery Plunk, MD  Admission Diagnoses: Chest Pain, Tooth Pain, Numbness and Tingling in Right Upper Extermity  Discharge Diagnoses:  Principal Problem:  *Chest pain, non-cardiac Active Problems:  Essential hypertension, benign  ASTHMA, INTERMITTENT  DEGENERATIVE JOINT DISEASE, BOTH KNEES, SEVERE  Morbid obesity  Tachycardia  Dental abscess   Discharged Condition: stable  Hospital Course:   1. Chest pain: Her chest pain was likely costochondritis based on tenderness on palpation on exam and negative troponin and a negative d dimer. It was improved at the time of discharge. This pain and her many other pains were treated with Toradol and her home dose of oxycodone 10 mg TID.   2. Tachycardia: This is a chronic problem. Her tachycardia was increased at admission due to pain and dehydration. The D-dimer was negative, essentially ruling out the possibility of a pulmonary embolism. While she complained of dizziness with standing, she did not have orthostatic hypertension. There was no reason to think that it was problematic.   3. Tooth pain: At presentation she had acute on chronic right sided mouth pain related to very poor dentition. It was accompanied by swelling on the right side. While imaging did not reveal an abscess, the patient had spontaneous drainage of loculated blood and purulent fluid which relieved the pain. Thereafter, she still has a nodular swelling of the gum around tooth 4. This was non-tender and could still be a chronic inflammatory reaction vs tumor vs variant of normal. It was very stable and gives no need for further work-up as an inpatient. She was discharge with 2 weeks of penicillin, and attempts have been made by the PCP to have her seen by a dentist as an outpatient.   4. Right leg pain: Negative calf  tenderness on exam. Pain likely acute on chronic secondary to DJD. She may have a component of Fibromyalgia as well.   5. Dyspnea - It was likely due to poor condition because of her morbid obesity. An echocardiogram was performed and demonstrated normal systolic function.   6. Hypokalemia - The patient had a K+ level of 2.9. Therefore, she was given potassium. However, her level on the day of discharge was 3.1. She was prescribed potassium on discharge and will need a follow-up chemistry panel checked.    Her home medications were continued for hypertension, hot flashes, mood and degenerative joint disease.   Follow-Up Issues: 1. Dental Abscess and Caries 2. Hypokalemia - She needs a follow BMET.   Consults: None  Significant Diagnostic Studies:   Echocardiogram: Left ventricle: Systolic function was normal. The estimated ejection fraction was in the range of 60% to 65%.  Cardiac Enzymes:  Lab 12/03/11 0830 12/03/11 0109 12/02/11 1721 12/02/11 0923 12/02/11 0117  CKTOTAL 164 163 140 115 97  CKMB 4.9* 4.1* 3.3 2.9 1.7  CKMBINDEX -- -- -- -- --  TROPONINI <0.30 <0.30 <0.30 <0.30 <0.30     Discharge Exam: Blood pressure 139/84, pulse 106, temperature 98.5 F (36.9 C), temperature source Oral, resp. rate 18, height 5\' 8"  (1.727 m), weight 292 lb (132.45 kg), SpO2 98.00%. Gen: alert, awake, comfortably sitting in a chair at bedside, morbidly obese, very pleasant person  HEENT: decreased induration of right cheek, non-tender nodule right upper gum near tooth 4, multiple caries, no erythema or tactile warmth, no drainage  CV: sinus tachycardia, no murmurs, decreased tenderness to palpation along  sternum  Lungs: CTA-B  Ab: morbidly obese Extremities: dependent edema but lots of adipose tissue, non-tender    Disposition: Final discharge disposition not confirmed  Discharge Orders    Future Orders Please Complete By Expires   Diet - low sodium heart healthy      Increase activity  slowly        Medication List  As of 12/03/2011  1:01 PM   TAKE these medications         aspirin EC 325 MG tablet   Take 650 mg by mouth every 6 (six) hours as needed. For pain      hydrochlorothiazide 25 MG tablet   Commonly known as: HYDRODIURIL   Take 25 mg by mouth daily.      loratadine 10 MG tablet   Commonly known as: CLARITIN   Take 10 mg by mouth daily.      nortriptyline 25 MG capsule   Commonly known as: PAMELOR   Take 25 mg by mouth at bedtime.      Oxycodone HCl 10 MG Tabs   Take 10 mg by mouth 3 (three) times daily.      Oxycodone HCl 10 MG Tabs   Take 10 mg by mouth 3 (three) times daily.      PARoxetine 20 MG tablet   Commonly known as: PAXIL   Take 20 mg by mouth every morning.      penicillin v potassium 500 MG tablet   Commonly known as: VEETID   Take 1 tablet (500 mg total) by mouth every 6 (six) hours.      potassium chloride SA 20 MEQ tablet   Commonly known as: K-DUR,KLOR-CON   Take 1 tablet (20 mEq total) by mouth 3 (three) times daily.      PROAIR HFA 108 (90 BASE) MCG/ACT inhaler   Generic drug: albuterol   Inhale 2 puffs into the lungs every 4 (four) hours as needed. Shortness of breath      traMADol 50 MG tablet   Commonly known as: ULTRAM   Take 50 mg by mouth 2 (two) times daily as needed. For pain           Follow-up Information    Follow up with SPIEGEL,RACHEL, MD. Schedule an appointment as soon as possible for a visit in 1 week.   Contact information:   [address] [place] [address] [phone]          Signed: Mat Carne 12/03/2011, 1:01 PM

## 2011-12-03 NOTE — Discharge Summary (Signed)
Family Medicine Teaching Service  Discharge Note : Attending Denny Levy MD Pager [phone] Office [phone] I have seen and examined this patient, reviewed their chart and discussed discharge planning wit the resident at the time of discharge. I agree with the discharge plan as above. Her PCP, Dr Hulen Luster is aware of her admission, has seen her while on call today. Is aware of tooth abscess and will arrange for dental f/u asap with indigent dental program. Until them we wil;l d/c on oral abx. Chest pain work up negative for cardiac source of pain.

## 2011-12-03 NOTE — Discharge Instructions (Signed)
I am very glad that you are feeling better. Please read below.   1. Tooth Pain: You should take the penicillin for two weeks to treat a potential infection. Also, we have registered you to see a dentist. It may take several weeks for [place] to get that appointment set up, but we will let you know when it is arranged.   2. Low Potassium: Your potassium level was low. Therefore, please take the potassium pills that I have precsribed for 1 week. Then see Dr. Hulen Luster to have her check your potassium level.   3. Exercise: Continue with your water aerobics to increase your fitness. You will feel much better.   4. Asthma: DON"T LET YOUR BROTHER SMOKE IN THE HOUSE! This is very bad for your lungs.    Please schedule an appointment with Dr. Hulen Luster as needed.   Sincerely,   Dr. Clinton Sawyer

## 2011-12-03 NOTE — ED Provider Notes (Signed)
Medical screening examination/treatment/procedure(s) were performed by non-physician practitioner and as supervising physician I was immediately available for consultation/collaboration.   Jermie Hippe, MD 12/03/11 0941 

## 2011-12-03 NOTE — Progress Notes (Signed)
Daily Progress Note Si Raider. Clinton Sawyer, M.D., M.B.A  Family Medicine PGY-1 Pager [phone]  Subjective: 1. Chest Pain - resolved  2. Oral Pain - Significantly improved after spontaneous rupture of loculated fluid and blood yesterday; no current drainage or bleeding, patient can open mouth, speak, and eat more easily    3. Numbness and tingling in right upper extremity - not present    4. Shortness of breath - improved, pt now believes it is due to asthma exacerbation due to her brother smoking in her home   5. Functional status - evaluated by PT and has not inpt PT needs   Objective: Vital signs in last 24 hours: Temp:  [98.5 F (36.9 C)-99.1 F (37.3 C)] 98.5 F (36.9 C) (05/04 0400) Pulse Rate:  [106-139] 106  (05/04 0400) Resp:  [18-20] 18  (05/04 0400) BP: (119-154)/(65-90) 139/84 mmHg (05/04 0400) SpO2:  [97 %-100 %] 98 % (05/04 0400) Weight change:  Last BM Date: 12/01/11  Physical Exam: Gen: alert, awake, comfortably sitting in a chair at bedside, morbidly obese HEENT: decreased induration of right cheek, non-tender nodule right upper gum near tooth 4, multiple caries, no erythema or tactile warmth, no drainage  CV: sinus tachycardia, no murmurs, decreased tenderness to palpation along sternum  Lungs: CTA-B Ab: morbidly obese,  Extremities: dependent edema but lots of adipose tissue, non-tender   Lab Results:  Basename 12/02/11 0510 12/02/11 0117  WBC 9.6 9.3  HGB 11.7* 13.2  HCT 35.6* 39.7  PLT 170 186   BMET  Basename 12/02/11 0510 12/02/11 0117 12/01/11 1822  NA 136 -- 137  K 2.9* -- 3.9  CL 101 -- 98  CO2 25 -- 27  GLUCOSE 94 -- 108*  BUN 8 -- 8  CREATININE 0.61 0.55 --  CALCIUM 8.4 -- 9.4    Studies/Results: Dg Orthopantogram  12/02/2011  *RADIOLOGY REPORT*  Clinical Data: Right-sided dental pain and right facial swelling.  [facility] 12/02/2011:  Comparison: None.  Findings: No peri apical lucencies involving any of the remaining  teeth to suggest abscess.  Caries are suspected in teeth numbers 20, 4.  Temporomandibular joints intact.  No osseous abnormalities involving the mandible or maxilla.  IMPRESSION: No evidence of dental abscess.  Probable caries involving teeth numbers 20, 4.  Original Report Authenticated By: Arnell Sieving, M.D.   Dg Chest 2 View  12/01/2011  *RADIOLOGY REPORT*  Clinical Data: Chest pain and shortness of breath.  CHEST - 2 VIEW  Comparison: None  Findings: The heart is mildly enlarged.  There is mild tortuosity of the thoracic aorta.  Low lung volumes with vascular crowding and atelectasis.  Elevation of the right hemidiaphragm is noted.  No pleural effusion or focal infiltrate.  The bony thorax is intact.  IMPRESSION: Streaky bibasilar atelectasis but no infiltrates, edema or effusions.  Original Report Authenticated By: P. Loralie Champagne, M.D.   CBG (last 3)   Basename 12/01/11 1800 12/01/11 1709  GLUCAP 115* 127*    Cardiac Enzymes:  Lab 12/03/11 0109 12/02/11 1721 12/02/11 0923 12/02/11 0117 12/01/11 1834  CKTOTAL 163 140 115 97 --  CKMB 4.1* 3.3 2.9 1.7 --  CKMBINDEX -- -- -- -- --  TROPONINI <0.30 <0.30 <0.30 <0.30 <0.30      Medications: I have reviewed the patient's current medications.  Assessment/Plan: # Chest Pressure: CP likely costochondritis based on tenderness on palpation on exam & negative troponin & negative d dimer - Improved, this is likely part of large pain syndrome, use  home med   # Tachycardia: D-dimer Negative. Tachy likely secondary to pain and dehydration. Per previous office visits, patient's HR runs 106-130.  - no evidence of DVT; No orthostatic hypertension; Problem stable  # Tooth pain: patient given one dose PCN in ED; This is improved likely due to spontaneously drained abscess in right-sided buccal mucosa. She still has a nodule swelling of the gum around tooth 4. This is non-tender, but could still be an infection vs tumor vs variant of normal. It  appears very stable and gives no need for further work-up as an inpatient.   # RT leg pain: Negative calf tenderness on exam. Pain likely acute on chronic secondary to DJD. She may have a component of Fibromyalgia as well.  - Improved  # Respiratory -  Asthma, intermittent: stable at this time; does not need to be on O2; Echo cardiogram normal; Stable problem   # Hypertension: BP well controlled  - Continue home medication HCTZ 25 mg daily   # Hot flashes, menopause: Continue Pamelor and Paxil per home regimen   # DJD: See above assessment for pain management  - home oxycodone 10 mg TID  # FEN/GI: patient requesting full meals, but also states she cannot swallow pills, therefore start with dysphagia diet;, MIVF   # PPX: Heparin SQ   # DISPO: Appropriate for d/c today as there is no indication for acute work-up or treatment of oral problems. She will need outpatient follow-up with a dentist.        LOS: 2 days   Mat Carne 12/03/2011, 9:29 AM

## 2011-12-03 NOTE — Progress Notes (Signed)
FMTS Attending Daily Note: Galia Rahm MD [phone] pager office [phone] I have discussed this patient with the resident and reviewed the assessment and plan as documented above. I agree wit the resident's findings and plan.  

## 2011-12-05 ENCOUNTER — Telehealth: Payer: Self-pay | Admitting: Family Medicine

## 2011-12-05 ENCOUNTER — Other Ambulatory Visit: Payer: Self-pay | Admitting: Family Medicine

## 2011-12-05 DIAGNOSIS — K047 Periapical abscess without sinus: Secondary | ICD-10-CM

## 2011-12-05 NOTE — Telephone Encounter (Signed)
Order placed for dental referral

## 2011-12-09 ENCOUNTER — Other Ambulatory Visit: Payer: Self-pay | Admitting: Family Medicine

## 2011-12-09 MED ORDER — HYDROCHLOROTHIAZIDE 25 MG PO TABS: 25.0000 mg | ORAL_TABLET | Freq: Every day | ORAL | Status: DC

## 2011-12-16 ENCOUNTER — Encounter: Payer: Self-pay | Admitting: Family Medicine

## 2011-12-16 ENCOUNTER — Ambulatory Visit (INDEPENDENT_AMBULATORY_CARE_PROVIDER_SITE_OTHER): Payer: Self-pay | Admitting: Family Medicine

## 2011-12-16 VITALS — BP 143/86 | HR 116 | Ht 68.0 in | Wt 304.0 lb

## 2011-12-16 DIAGNOSIS — K047 Periapical abscess without sinus: Secondary | ICD-10-CM

## 2011-12-16 DIAGNOSIS — N951 Menopausal and female climacteric states: Secondary | ICD-10-CM

## 2011-12-16 DIAGNOSIS — R000 Tachycardia, unspecified: Secondary | ICD-10-CM

## 2011-12-16 DIAGNOSIS — E876 Hypokalemia: Secondary | ICD-10-CM

## 2011-12-16 MED ORDER — NORTRIPTYLINE HCL 50 MG PO CAPS: 50.0000 mg | ORAL_CAPSULE | Freq: Every day | ORAL | Status: DC

## 2011-12-16 NOTE — Progress Notes (Signed)
  Subjective:    Patient ID: Colleen Olson, female    DOB: September 18, 1960, 51 y.o.   MRN: [id number]  HPI Hospital followup.  Tachycardia-patient is that she gets rapid heartbeat with anxiety, with hot flashes, and when she takes her albuterol. She has some chest pain when this happens. Mostly she is concerned about the rapid heartbeat. She had this chest pain in the hospital and was ruled out for MI at that time. The chest pain is not changed or more frequent.  Dental pain-awaiting referral. The infection is better. She took one week of penicillin. It does not seem to still be draining. She has not had any fevers.  Hot flashes-still having hot flashes. She has not noticed that they're much better on the Paxil or the Pamelor. She is willing to consider continuing these though.  Nonsmoker, chronic pain Review of Systems    no nausea, vomiting, diarrhea. No headache. Objective:   Physical Exam  Vital signs reviewed General appearance - alert, well appearing, and in no distress Heart - normal rate, regular rhythm, normal S1, S2, no murmurs, rubs, clicks or gallops Rechecked and 100BPM heart rate Abdomen - soft, nontender, nondistended, no masses or organomegaly       Assessment & Plan:

## 2011-12-16 NOTE — Assessment & Plan Note (Signed)
Will send for cardiology evaluation. This was a plan when she left the hospital.

## 2011-12-16 NOTE — Assessment & Plan Note (Signed)
Increase nortriptyline to 50 mg. Recheck 1 month

## 2011-12-16 NOTE — Patient Instructions (Signed)
Today I sent in an increase prescription for Pamelor. You can just take 2 of the 25 mg until you need to get a new bottle  We check your labs today to make sure your potassium is okay I will call you if you need to restart potassium  Hopefully we can get you in to the dentist soon. I have sent in a referral for cardiology.  Come back in one month to see her you're doing with hot flashes

## 2011-12-16 NOTE — Assessment & Plan Note (Signed)
Awaiting dental referral.

## 2011-12-16 NOTE — Assessment & Plan Note (Signed)
Recheck today. Start potassium supplements as needed.

## 2011-12-17 LAB — BASIC METABOLIC PANEL
BUN: 11 mg/dL (ref 6–23)
Chloride: 103 mEq/L (ref 96–112)
Creat: 0.61 mg/dL (ref 0.50–1.10)

## 2011-12-19 ENCOUNTER — Encounter: Payer: Self-pay | Admitting: *Deleted

## 2011-12-29 ENCOUNTER — Ambulatory Visit: Payer: Self-pay | Admitting: Cardiovascular Disease

## 2011-12-29 ENCOUNTER — Ambulatory Visit (INDEPENDENT_AMBULATORY_CARE_PROVIDER_SITE_OTHER): Payer: Self-pay | Admitting: Cardiovascular Disease

## 2011-12-29 ENCOUNTER — Encounter: Payer: Self-pay | Admitting: Cardiovascular Disease

## 2011-12-29 VITALS — BP 118/78 | Ht 68.0 in | Wt 306.8 lb

## 2011-12-29 DIAGNOSIS — E669 Obesity, unspecified: Secondary | ICD-10-CM | POA: Insufficient documentation

## 2011-12-29 DIAGNOSIS — I10 Essential (primary) hypertension: Secondary | ICD-10-CM

## 2011-12-29 DIAGNOSIS — R000 Tachycardia, unspecified: Secondary | ICD-10-CM

## 2011-12-29 DIAGNOSIS — J45909 Unspecified asthma, uncomplicated: Secondary | ICD-10-CM

## 2011-12-29 NOTE — Patient Instructions (Signed)
Your physician recommends that you schedule a follow-up appointment in: as needed Your physician recommends that you continue on your current medications as directed. Please refer to the Current Medication list given to you today.   

## 2011-12-29 NOTE — Progress Notes (Signed)
Patient ID: Colleen Olson, female   DOB: [birth year], 51 y.o.   MRN: [id number] 51 yo recently hospitalized for palpitations and dyspnea.  R/O echo normal and no arrhythmia on tele.  She is morbidly obese and sedentary.  TSH normal in hospital.  Feels her HR is too high and has occasional palpitations. No chest pain and no syncope.  QT normal on ECG.  Denies excess stimulants.  Dyspnea is functional with component of asthma uses proair HFA.  She does not work.  Has poor diet Followed by [facility].  Says chronic back pain keeps her from exercising  ROS: Denies fever, malais, weight loss, blurry vision, decreased visual acuity, cough, sputum, SOB, hemoptysis, pleuritic pain, palpitaitons, heartburn, abdominal pain, melena, lower extremity edema, claudication, or rash.  All other systems reviewed and negative   General: Affect appropriate Healthy:  appears stated age HEENT: normal Neck supple with no adenopathy JVP normal no bruits no thyromegaly Lungs clear with no wheezing and good diaphragmatic motion Heart:  S1/S2 no murmur,rub, gallop or click PMI normal Abdomen: benighn, BS positve, no tenderness, no AAA no bruit.  No HSM or HJR Distal pulses intact with no bruits No edema Neuro non-focal Skin warm and dry No muscular weakness  Medications Current Outpatient Prescriptions  Medication Sig Dispense Refill  . albuterol (PROAIR HFA) 108 (90 BASE) MCG/ACT inhaler Inhale 2 puffs into the lungs every 4 (four) hours as needed. Shortness of breath      . hydrochlorothiazide (HYDRODIURIL) 25 MG tablet Take 1 tablet (25 mg total) by mouth daily.  30 tablet  11  . loratadine (CLARITIN) 10 MG tablet Take 10 mg by mouth daily.        . nortriptyline (PAMELOR) 50 MG capsule Take 1 capsule (50 mg total) by mouth at bedtime.  30 capsule  11  . Oxycodone HCl 10 MG TABS Take 10 mg by mouth 3 (three) times daily.      Marland Kitchen PARoxetine (PAXIL) 20 MG tablet Take 20 mg by mouth every morning.        . traMADol (ULTRAM) 50 MG tablet Take 50 mg by mouth 2 (two) times daily as needed. For pain        Allergies Review of patient's allergies indicates no known allergies.  Family History: Family History  Problem Relation Age of Onset  . Diabetes Mother   . Diabetes Father   . Diabetes Sister   . Diabetes Brother     Social History: History   Social History  . Marital Status: Single    Spouse Name: N/A    Number of Children: N/A  . Years of Education: N/A   Occupational History  . Not on file.   Social History Main Topics  . Smoking status: Never Smoker   . Smokeless tobacco: Never Used   Comment: 12/02/11 "around second hand smoke"  . Alcohol Use: No  . Drug Use: No  . Sexually Active: Not Currently    Birth Control/ Protection: Abstinence, Post-menopausal   Other Topics Concern  . Not on file   Social History Narrative  . No narrative on file    Electrocardiogram: NSR rate 73 PR 242  Assessment and Plan

## 2011-12-29 NOTE — Assessment & Plan Note (Signed)
Avoid beta blockers if possible continue inhalers. Lose weight

## 2011-12-29 NOTE — Assessment & Plan Note (Signed)
Functional with no evidence of arrhythmia on tele in hospital normal ECG , echo and TSH.  Needs to loose weight and has anxiety

## 2011-12-29 NOTE — Assessment & Plan Note (Signed)
This is her main health issue discussed diet and exercise.  Also told her to consider bariatric surgery

## 2011-12-29 NOTE — Assessment & Plan Note (Signed)
Well controlled.  Continue current medications and low sodium Dash type diet.    

## 2011-12-30 ENCOUNTER — Telehealth: Payer: Self-pay | Admitting: *Deleted

## 2011-12-30 NOTE — Telephone Encounter (Signed)
Spoke with patient and confirmed that fax that I have received about dental clinic appointment . 01/23/2012 @ 9:15am. [address] [phone]. Patient confirmed that they also have contacted her

## 2012-01-10 ENCOUNTER — Telehealth: Payer: Self-pay | Admitting: Family Medicine

## 2012-01-10 MED ORDER — NORTRIPTYLINE HCL 25 MG PO CAPS: 50.0000 mg | ORAL_CAPSULE | Freq: Every day | ORAL | Status: DC

## 2012-01-10 NOTE — Telephone Encounter (Signed)
Called [facility] and the  Nortriptyline 25 mg  two at bedtime will cost $8.00 . The 50 mg will cost $17.00. Consulted with Dr. Sheffield Slider and new RX sent in . Patient notified and she is OK with this.. Cancelled 50 mg capsules at pharmacy.

## 2012-01-10 NOTE — Telephone Encounter (Signed)
States that the Nortriptyline 50mg  is not on $4 plan and wants to change it back to 25mg . [facility] [place]

## 2012-01-18 ENCOUNTER — Ambulatory Visit (INDEPENDENT_AMBULATORY_CARE_PROVIDER_SITE_OTHER): Payer: Self-pay | Admitting: Family Medicine

## 2012-01-18 ENCOUNTER — Encounter: Payer: Self-pay | Admitting: Family Medicine

## 2012-01-18 VITALS — BP 130/91 | HR 107 | Temp 98.3°F | Ht 68.0 in | Wt 302.0 lb

## 2012-01-18 DIAGNOSIS — M1710 Unilateral primary osteoarthritis, unspecified knee: Secondary | ICD-10-CM

## 2012-01-18 DIAGNOSIS — N951 Menopausal and female climacteric states: Secondary | ICD-10-CM

## 2012-01-18 MED ORDER — OXYCODONE HCL 10 MG PO TABS: 10.0000 mg | ORAL_TABLET | Freq: Three times a day (TID) | ORAL | Status: DC

## 2012-01-18 NOTE — Assessment & Plan Note (Signed)
Continue oxycodone 10 mg 3 times a day. Gave 3 months of refills.

## 2012-01-18 NOTE — Assessment & Plan Note (Signed)
Significantly improved on nortriptyline and Paxil. Continue current management.

## 2012-01-18 NOTE — Patient Instructions (Addendum)
I have filled 3 months of your medicine for the oxycodone  Please come back when you're ready for refills  I am so glad that you're feeling better with the hot flashes

## 2012-01-18 NOTE — Assessment & Plan Note (Signed)
Improved. 4 pound weight loss. Continues to use water aerobics for weight loss. Encouraged her continued behavior change.

## 2012-01-18 NOTE — Progress Notes (Signed)
  Subjective:    Patient ID: Colleen Olson, female    DOB: 01/17/1961, 51 y.o.   MRN: [id number]  HPI  Knee pain-patient states that she is managing her knee pain well with 10 mg oxycodone 3 times a day. She also takes tramadol as needed. She is looking forward to losing enough weight for her total knee replacement. She continues to do water aerobics and does that well. She's using a cane for mobility. Obesity-patient has lost 4 pounds since last visit. She is trying to watch which she eats but mostly she is increase her activity by going to water aerobics 3 times a week  Hot flashes-patient notes that now she will only get hot flashes once or twice a week. They're significantly better on increased dose of nortriptyline she would like to continue this current management.  Nonsmoker  Review of Systems Denies CP, SOB, HA, N/V/D, fever    Objective:   Physical Exam Vital signs reviewed General appearance - alert, well appearing, and in no distress Heart - normal rate, regular rhythm, normal S1, S2, no murmurs, rubs, clicks or gallops Chest - clear to auscultation, no wheezes, rales or rhonchi, symmetric air entry, no tachypnea, retractions or cyanosis Knees-patient using cane for support. Able to get up out of chair with cane. No redness, effusion       Assessment & Plan:

## 2012-04-12 ENCOUNTER — Ambulatory Visit: Payer: Self-pay | Admitting: Family Medicine

## 2012-04-20 ENCOUNTER — Ambulatory Visit (INDEPENDENT_AMBULATORY_CARE_PROVIDER_SITE_OTHER): Payer: Medicare Other | Admitting: Family Medicine

## 2012-04-20 ENCOUNTER — Encounter: Payer: Self-pay | Admitting: Family Medicine

## 2012-04-20 VITALS — BP 130/88 | HR 106 | Ht 68.0 in | Wt 309.6 lb

## 2012-04-20 DIAGNOSIS — M1710 Unilateral primary osteoarthritis, unspecified knee: Secondary | ICD-10-CM

## 2012-04-20 DIAGNOSIS — Z23 Encounter for immunization: Secondary | ICD-10-CM

## 2012-04-20 DIAGNOSIS — I10 Essential (primary) hypertension: Secondary | ICD-10-CM

## 2012-04-20 MED ORDER — OXYCODONE HCL 10 MG PO TABS: 10.0000 mg | ORAL_TABLET | Freq: Three times a day (TID) | ORAL | Status: DC

## 2012-04-20 NOTE — Assessment & Plan Note (Signed)
Followed by ortho in [place]. Will refill Oxycodone 10mg  TID for three months. She will also make arrangements to be seen by a joint care specialist, per recommendations of ortho.

## 2012-04-20 NOTE — Patient Instructions (Signed)
It was so nice to meet you today.  Please keep a close eye on your blood pressure. We may need to increase your medications at your next visit if it is still high. I have refilled your pain medication.  Please come back to see me in about 3 months for a check up.  Take care! Candise Crabtree M. Ricca Melgarejo, M.D.

## 2012-04-20 NOTE — Assessment & Plan Note (Signed)
Patient's BP elevated at triage with some improvement at recheck. Encouraged her to check her BP at home, if possible. Will continue to monitor but no changes today. She is followed by Cardiology as well. Also continued to encourage weight loss.

## 2012-04-20 NOTE — Progress Notes (Signed)
Patient ID: Colleen Olson, female   DOB: 25-Nov-1960, 51 y.o.   MRN: [id number]  [name] Miko Sirico M. Omaria Plunk, MD Phone: [phone]   Subjective: HPI: Patient is a 51 y.o. female presenting to clinic today for follow-up. Concerns today include knee pain  1. Knee Pain- patient is seen by a ortho in [place]. 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 She will also have a left knee replacement in the future as well. She takes Oxycodone 10mg  TID which helps with pain. Also takes Tramadol BID. Pain has been getting progressively worse. She does not use heat or ice. Walking with cane but may start walking with walker to help relieve some of the pain from her back and knee. No fevers, chills, rashes, no knee redness.  2. Hypertension- Blood pressure at home: Does not check at home Blood pressure today: 153/95 in triage Taking Meds: Yes Side effects: No side effects ROS: Denies headache visual changes nausea, vomiting, chest pain or abdominal pain or shortness of breath.  History Reviewed: Non-smoker. Health Maintenance: Needs flu shot; will give today.  ROS: Please see HPI above.  Objective: Office vital signs reviewed.  Physical Examination:  General: Awake, alert. Morbidly obese HEENT: Atraumatic, normocephalic. Missing front teeth Pulm: CTAB, no wheezes Cardio: RRR, no murmurs appreciated Abdomen: Obese, +BS, soft, nontender Extremities: No edema. Knees painful to palpation bilaterally. She has a scar on left knee. Neuro: Grossly intact. Walks with cane  Assessment: 51 yo F follow-up appointment  Plan: See Problem List and After Visit Summary

## 2012-06-13 ENCOUNTER — Other Ambulatory Visit: Payer: Self-pay | Admitting: *Deleted

## 2012-06-13 MED ORDER — TRAMADOL HCL 50 MG PO TABS: 50.0000 mg | ORAL_TABLET | Freq: Two times a day (BID) | ORAL | Status: DC | PRN

## 2012-06-30 ENCOUNTER — Other Ambulatory Visit: Payer: Self-pay | Admitting: Family Medicine

## 2012-07-05 ENCOUNTER — Other Ambulatory Visit: Payer: Self-pay | Admitting: Family Medicine

## 2012-07-05 DIAGNOSIS — Z1231 Encounter for screening mammogram for malignant neoplasm of breast: Secondary | ICD-10-CM

## 2012-07-11 ENCOUNTER — Encounter: Payer: Self-pay | Admitting: Family Medicine

## 2012-07-11 ENCOUNTER — Ambulatory Visit (INDEPENDENT_AMBULATORY_CARE_PROVIDER_SITE_OTHER): Payer: Medicare Other | Admitting: Family Medicine

## 2012-07-11 VITALS — BP 145/81 | HR 114 | Temp 97.8°F | Ht 68.0 in | Wt 320.0 lb

## 2012-07-11 DIAGNOSIS — M549 Dorsalgia, unspecified: Secondary | ICD-10-CM

## 2012-07-11 DIAGNOSIS — M1710 Unilateral primary osteoarthritis, unspecified knee: Secondary | ICD-10-CM

## 2012-07-11 DIAGNOSIS — J309 Allergic rhinitis, unspecified: Secondary | ICD-10-CM

## 2012-07-11 MED ORDER — OXYCODONE HCL 10 MG PO TABS: 10.0000 mg | ORAL_TABLET | Freq: Three times a day (TID) | ORAL | Status: DC

## 2012-07-11 MED ORDER — TRAMADOL HCL 50 MG PO TABS: 50.0000 mg | ORAL_TABLET | Freq: Two times a day (BID) | ORAL | Status: DC

## 2012-07-11 MED ORDER — CETIRIZINE HCL 5 MG/5ML PO SYRP: 10.0000 mg | ORAL_SOLUTION | Freq: Every day | ORAL | Status: DC

## 2012-07-11 NOTE — Assessment & Plan Note (Signed)
Refilled Oxy 10mg  x3 months as well as Tramadol. Patient has signed pain contract with me today. Continue current management and encourage weight loss for knee surgery.

## 2012-07-11 NOTE — Assessment & Plan Note (Signed)
Encouraged to lose weight for her knee surgery. Will recheck labs at next office visit.

## 2012-07-11 NOTE — Assessment & Plan Note (Signed)
At this time, I do not feel like she has had appropriate assessment for tens unit. Will refer to PT for eval and treat and make recommendations for DME such as walker with seat and/or tens unit. Continue exercises.

## 2012-07-11 NOTE — Patient Instructions (Signed)
It was good to see you today!  I have sent in your Tramadol and Zyrtec prescriptions.  Please come back to see me in 3 months, or sooner if needed. Take care!  Amahd Morino M. Tyona Nilsen, M.D.

## 2012-07-11 NOTE — Assessment & Plan Note (Signed)
Started on Zyrtec 10mg  (syrup) for allergic rhinitis. Will try this for now with plan to add Flonase at next visit, if needed.

## 2012-07-11 NOTE — Progress Notes (Signed)
Patient ID: Colleen Olson, female   DOB: 21-Oct-1960, 51 y.o.   MRN: [id number] [facility] Colleen Paterson M. Colleen Croy, MD Phone: [phone]  Subjective: HPI: Patient is a 51 y.o. female presenting to clinic today for follow up appointment. Concerns today include need pain medicine refill. Also states she has "world's worst sinuses" and needs to restart her sinus medication.   1. Knee Pain- Left > right. Had partial knee replacement on that leg. She follows up at Ortho in [place] yearly. She cannot have knee replacement until she loses weight. Takes Oxy 10mg  which helps, also taking Tramadol BID. No swelling of knee, no redness. Feels like she has good range of motion but pain with standing.   2. Sinus pain- Pt states she was on Rx medication in the past, but since she was not on Medicaid/Medicare she was taking over the counter. Previously tried Claritin which did not help, but would like to try Zyrtec. Reports nasal congestion and cough. No HA, or sinus pressure. No fevers.  3. Back pain- Patient states she was in physical therapy in the past which did not help much. She is requesting a TENs unit and a walker with a chair. She uses motorized wheelchair at store. She has not had a mobility assessment by PT, or a documented need for a TENs unit.    History Reviewed: Non-smoker. Health Maintenance: Had flu shot  ROS: Please see HPI above.  Objective: Office vital signs reviewed.  Physical Examination:  General: Awake, alert. NAD HEENT: Atraumatic, normocephalic. Pulm: CTAB, no wheezes Cardio: RRR, no murmurs appreciated Abdomen: Obese, +BS, soft, nontender, nondistended Extremities: No edema. Large legs. TTP of left patella. Able to straighten left knee to 0 degrees, and flex to 100 degrees. +crepitus.  Neuro: Grossly intact. Walks with a cane with abnormal gait.  Assessment: 51 yo F follow up appointment   Plan: See Problem List and After Visit Summary

## 2012-08-06 ENCOUNTER — Ambulatory Visit: Payer: Medicare Other | Attending: Family Medicine

## 2012-08-06 DIAGNOSIS — Z96659 Presence of unspecified artificial knee joint: Secondary | ICD-10-CM | POA: Insufficient documentation

## 2012-08-06 DIAGNOSIS — IMO0001 Reserved for inherently not codable concepts without codable children: Secondary | ICD-10-CM | POA: Insufficient documentation

## 2012-08-06 DIAGNOSIS — R262 Difficulty in walking, not elsewhere classified: Secondary | ICD-10-CM | POA: Insufficient documentation

## 2012-08-06 DIAGNOSIS — M25669 Stiffness of unspecified knee, not elsewhere classified: Secondary | ICD-10-CM | POA: Insufficient documentation

## 2012-08-06 DIAGNOSIS — M25569 Pain in unspecified knee: Secondary | ICD-10-CM | POA: Insufficient documentation

## 2012-08-09 ENCOUNTER — Telehealth: Payer: Self-pay | Admitting: Family Medicine

## 2012-08-09 ENCOUNTER — Ambulatory Visit: Payer: Medicare Other | Admitting: Physical Therapy

## 2012-08-09 NOTE — Telephone Encounter (Signed)
Was told that PT faxed over recommendation for her TINS unit - wants to know when she can pick up script

## 2012-08-10 NOTE — Telephone Encounter (Signed)
Spoke with [facility], they received what they needed back from [place] on the 7th.  They will take care of attempting to get it approved by medicaid, but made a need a letter of medical necessity from Dr. Mikel Cella.  Dr. Mikel Cella informed and agreeable .Alida Greiner, Maryjo Rochester

## 2012-08-10 NOTE — Telephone Encounter (Signed)
I have not received this paperwork from PT.  Thank you! Rita Vialpando M. Janann Boeve, M.D.

## 2012-08-14 ENCOUNTER — Ambulatory Visit: Payer: Medicare Other | Admitting: Physical Therapy

## 2012-08-15 ENCOUNTER — Telehealth: Payer: Self-pay | Admitting: Family Medicine

## 2012-08-15 NOTE — Telephone Encounter (Signed)
Pt is also needs a script for a 4 wheel walker with chair - pt will pick up when ready

## 2012-08-16 ENCOUNTER — Ambulatory Visit: Payer: Medicare Other | Admitting: Physical Therapy

## 2012-08-16 NOTE — Telephone Encounter (Signed)
This DME must be approved by an attending. She will need documented proof of need. I will start this process and we will let her know when she can pick it up.  Thanks- [employer]. Nyan Dufresne, M.D.

## 2012-08-16 NOTE — Telephone Encounter (Signed)
Advised pt that the process has started re: walker and we will contact when ready, pt states she would like a Rx for the Tens unit if [facility] wont pay for it-as she knows somewhere else she can get it w/a RX.

## 2012-08-17 NOTE — Telephone Encounter (Signed)
Discussed DME form with Lupita Leash. PT should send over form for wheelchair, if that is what they recommend.  Thank you! Amber M. Hairford, M.D.

## 2012-08-20 ENCOUNTER — Ambulatory Visit
Admission: RE | Admit: 2012-08-20 | Discharge: 2012-08-20 | Disposition: A | Discharge status: Home / Self Care | Payer: Medicare Other | Source: Ambulatory Visit | Attending: Family Medicine | Admitting: Family Medicine

## 2012-08-20 DIAGNOSIS — Z1231 Encounter for screening mammogram for malignant neoplasm of breast: Secondary | ICD-10-CM

## 2012-08-20 NOTE — Telephone Encounter (Signed)
See previous message Colleen Olson, Colleen Olson

## 2012-08-20 NOTE — Telephone Encounter (Signed)
Pt is asking to speak to Dr Mikel Cella about her TINS unit - she is not understanding why this is taking so long. Also needs script for the walker.

## 2012-08-20 NOTE — Telephone Encounter (Signed)
Explained to pt that we contacted PT on the 10th and they were working on getting the tens unit approved thru medicaid.  As far as the walker, I called an LMOVM of PT @ [facility].  We usually receive request for the durable medical equipment, will await callback from PT to see what is the next step. [place], Maryjo Rochester

## 2012-08-20 NOTE — Telephone Encounter (Signed)
Spoke with Keisha @ outpatient rehab, states that pt needs a walker with the seat attached.  States that we would just need to give pt the Rx and she can take it to a medical supply store and get it.  Also spoke with pt and she has a place that can get her one for free, all she needs is a Rx.  Advised I would send message to MD, and give her a call back. Connery Shiffler Dawn 

## 2012-08-20 NOTE — Telephone Encounter (Signed)
Spoke with [facility] @ outpatient rehab, states that pt needs a walker with the seat attached.  States that we would just need to give pt the Rx and she can take it to a medical supply store and get it.  Also spoke with pt and she has a place that can get her one for free, all she needs is a Rx.  Advised I would send message to MD, and give her a call back. Sky Primo, Maryjo [place]

## 2012-08-21 ENCOUNTER — Ambulatory Visit: Payer: Medicare Other | Admitting: Physical Therapy

## 2012-08-22 NOTE — Telephone Encounter (Signed)
Pt is calling again about script

## 2012-08-23 ENCOUNTER — Ambulatory Visit: Payer: Medicare Other | Admitting: Physical Therapy

## 2012-08-23 NOTE — Telephone Encounter (Signed)
Called to inform pt about the Rx.  She then inquired about the rx for the TENS unit and states that same company can also get her  the unit.  Advised I would forward to MD.    Pt also states that her BP was 170/105 yesterday so she took and extra pill.  Made appt for pt to come in and have a BP checkup, pt only agreeable to appt next Thursday. Colleen Olson, Maryjo [place]

## 2012-08-23 NOTE — Telephone Encounter (Signed)
Rx completed and discussed with Dr. McDiarmid since DME must be signed by attending. Rx is available for pick up at front office.  Please let [name] know.  Thank you! Colleen Olson M. [name], M.D.

## 2012-08-23 NOTE — Telephone Encounter (Signed)
Md placed other Rx up front.  Pt informed. Fleeger, Colleen Olson

## 2012-08-28 ENCOUNTER — Ambulatory Visit: Payer: Medicare Other | Admitting: Physical Therapy

## 2012-08-30 ENCOUNTER — Ambulatory Visit: Payer: Medicare Other | Admitting: Physical Therapy

## 2012-08-30 ENCOUNTER — Ambulatory Visit: Payer: Medicare Other | Admitting: Family Medicine

## 2012-09-04 ENCOUNTER — Ambulatory Visit: Payer: Medicare Other | Attending: Emergency Medicine | Admitting: Physical Therapy

## 2012-09-04 DIAGNOSIS — R5381 Other malaise: Secondary | ICD-10-CM | POA: Insufficient documentation

## 2012-09-04 DIAGNOSIS — M545 Low back pain, unspecified: Secondary | ICD-10-CM | POA: Insufficient documentation

## 2012-09-04 DIAGNOSIS — IMO0001 Reserved for inherently not codable concepts without codable children: Secondary | ICD-10-CM | POA: Insufficient documentation

## 2012-09-04 DIAGNOSIS — M25659 Stiffness of unspecified hip, not elsewhere classified: Secondary | ICD-10-CM | POA: Insufficient documentation

## 2012-09-06 ENCOUNTER — Ambulatory Visit: Payer: Medicare Other | Admitting: Physical Therapy

## 2012-10-05 ENCOUNTER — Ambulatory Visit: Payer: Medicare Other | Admitting: Family Medicine

## 2012-10-08 ENCOUNTER — Telehealth: Payer: Self-pay | Admitting: Family Medicine

## 2012-10-08 NOTE — Telephone Encounter (Signed)
Patient is calling asking for refill on her Oxycodone.

## 2012-10-09 MED ORDER — OXYCODONE HCL 10 MG PO TABS: 10.0000 mg | ORAL_TABLET | Freq: Three times a day (TID) | ORAL | Status: DC

## 2012-10-09 NOTE — Telephone Encounter (Signed)
Pt informed. Fleeger, Jessica Dawn  

## 2012-10-09 NOTE — Telephone Encounter (Signed)
Rx refilled and she may pick up. Please remind patient she will need follow up appointment prior to next refill. (in 3 months)  Thanks, [employer]. Hairford, M.D.

## 2012-10-11 ENCOUNTER — Other Ambulatory Visit: Payer: Self-pay | Admitting: *Deleted

## 2012-10-11 MED ORDER — PAROXETINE HCL 20 MG PO TABS: 20.0000 mg | ORAL_TABLET | ORAL | Status: DC

## 2012-10-11 NOTE — Telephone Encounter (Signed)
Received fax from [facility] pharmacy requesting refill of Paroxetine 20 mg #30 tabs.  Will route request to Dr. Mikel Cella.   Gaylene Brooks, RN

## 2012-10-30 ENCOUNTER — Other Ambulatory Visit: Payer: Self-pay | Admitting: Family Medicine

## 2012-10-30 MED ORDER — TRAMADOL HCL 50 MG PO TABS: 50.0000 mg | ORAL_TABLET | Freq: Two times a day (BID) | ORAL | Status: DC

## 2012-11-27 ENCOUNTER — Encounter: Payer: Self-pay | Admitting: Family Medicine

## 2012-11-27 ENCOUNTER — Ambulatory Visit (INDEPENDENT_AMBULATORY_CARE_PROVIDER_SITE_OTHER): Payer: Medicare Other | Admitting: Family Medicine

## 2012-11-27 VITALS — BP 165/97 | Ht 68.0 in | Wt 319.0 lb

## 2012-11-27 DIAGNOSIS — M1710 Unilateral primary osteoarthritis, unspecified knee: Secondary | ICD-10-CM

## 2012-11-27 DIAGNOSIS — J309 Allergic rhinitis, unspecified: Secondary | ICD-10-CM

## 2012-11-27 DIAGNOSIS — I10 Essential (primary) hypertension: Secondary | ICD-10-CM

## 2012-11-27 MED ORDER — ALBUTEROL SULFATE HFA 108 (90 BASE) MCG/ACT IN AERS: 2.0000 | INHALATION_SPRAY | RESPIRATORY_TRACT | Status: DC | PRN

## 2012-11-27 MED ORDER — NORTRIPTYLINE HCL 25 MG PO CAPS
50.0000 mg | ORAL_CAPSULE | Freq: Every day | ORAL | Status: DC
Start: 2012-11-27 — End: 2012-11-30

## 2012-11-27 MED ORDER — OXYCODONE HCL 10 MG PO TABS: 10.0000 mg | ORAL_TABLET | Freq: Three times a day (TID) | ORAL | Status: DC

## 2012-11-27 MED ORDER — HYDROCHLOROTHIAZIDE 25 MG PO TABS: 25.0000 mg | ORAL_TABLET | Freq: Every day | ORAL | Status: DC

## 2012-11-27 MED ORDER — CETIRIZINE HCL 10 MG PO TABS: 10.0000 mg | ORAL_TABLET | Freq: Every day | ORAL | Status: DC

## 2012-11-27 MED ORDER — PAROXETINE HCL 20 MG PO TABS: 20.0000 mg | ORAL_TABLET | ORAL | Status: DC

## 2012-11-27 MED ORDER — FLUTICASONE PROPIONATE 50 MCG/ACT NA SUSP: 2.0000 | Freq: Every day | NASAL | Status: DC

## 2012-11-27 MED ORDER — TRAMADOL HCL 50 MG PO TABS: 50.0000 mg | ORAL_TABLET | Freq: Two times a day (BID) | ORAL | Status: DC

## 2012-11-27 NOTE — Assessment & Plan Note (Signed)
Elevated today, but other recordings look ok. Will continue HCTZ since she states it has been doing well. If still elevated at next visit, will consider adding additional medication.

## 2012-11-27 NOTE — Progress Notes (Signed)
Patient ID: Colleen Olson, female   DOB: Sep 29, 1960, 52 y.o.   MRN: [id number]  [name] Amber M. Hairford, MD Phone: [phone]   Subjective: HPI: Patient is a 52 y.o. female presenting to clinic today for follow up appointment. Concerns today include sinusitis.  1. Knee pain - On Oxycodone and Tramadol. Went to PT and enjoyed that. Got the walker with seat attachment but cannot find anyone with the TENS unit. Goes to knee doctor in [place] and is thinking about going back for re-eval soon. Has not been able to lose weight.  2. Hypertension Blood pressure at home: Brought in meter with her, dizzy in January 170/110 but has been down lower except for that. Blood pressure today: 165/97 Taking Meds: Yes, HCTZ Side effects: None ROS: Denies headache, visual changes, nausea, vomiting, chest pain, abdominal pain or shortness of breath.  3. Sinusitis- Coughs all the time, sniffles and watery eyes. Tried Zyrtec and cough syrup. Was on Flonase in the past. Uses inhaler as needed for SOB or around smoke. She states her allergies have been a big problem for her.  History Reviewed: Non-smoker. Health Maintenance: UTD  ROS: Please see HPI above.  Objective: Office vital signs reviewed. Ht 5\' 8"  (1.727 m)  Wt 319 lb (144.697 kg)  BMI 48.51 kg/m2  Physical Examination:  General: Awake, alert. NAD. Very pleasant HEENT: Atraumatic, normocephalic. Cobblestoning of posterior pharynx with redness. Neck: No masses palpated. No LAD Pulm: CTAB, no wheezes Cardio: Tachycardic, no murmurs appreciated Abdomen: Obese, +BS, soft, nontender, nondistended Extremities: No edema. TTP of both knees bilaterally Neuro: Grossly intact  Assessment: 52 y.o. female follow up appointment  Plan: See Problem List and After Visit Summary

## 2012-11-27 NOTE — Assessment & Plan Note (Signed)
Pain medication refilled today, oxycodone 10mg  x3 months and Tramadol. RTC in 3 months for re-evaluation and labs.

## 2012-11-27 NOTE — Patient Instructions (Signed)
It was good to see you today! I am glad everything is going well.  I have sent in a prescription for nose spray and Zyrtec pills.  I will see you in 2-3 months, or sooner if you need anything.  Thayne Cindric M. Cheveyo [place], M.D.

## 2012-11-27 NOTE — Assessment & Plan Note (Signed)
Zyrtec changed to PO pill and added Flonase nasal spray. Encouraged to avoid irritants as much as possible for now, which is difficult given pollen.

## 2012-11-30 ENCOUNTER — Encounter (HOSPITAL_COMMUNITY): Payer: Self-pay | Admitting: *Deleted

## 2012-11-30 ENCOUNTER — Emergency Department (HOSPITAL_COMMUNITY): Payer: Medicare Other

## 2012-11-30 ENCOUNTER — Emergency Department (HOSPITAL_COMMUNITY)
Admission: EM | Admit: 2012-11-30 | Discharge: 2012-11-30 | Disposition: A | Discharge status: Home / Self Care | Payer: Medicare Other | Attending: Emergency Medicine | Admitting: Emergency Medicine

## 2012-11-30 DIAGNOSIS — S40019A Contusion of unspecified shoulder, initial encounter: Secondary | ICD-10-CM | POA: Insufficient documentation

## 2012-11-30 DIAGNOSIS — W108XXA Fall (on) (from) other stairs and steps, initial encounter: Secondary | ICD-10-CM | POA: Insufficient documentation

## 2012-11-30 DIAGNOSIS — S79919A Unspecified injury of unspecified hip, initial encounter: Secondary | ICD-10-CM | POA: Insufficient documentation

## 2012-11-30 DIAGNOSIS — Z8679 Personal history of other diseases of the circulatory system: Secondary | ICD-10-CM | POA: Insufficient documentation

## 2012-11-30 DIAGNOSIS — Y9389 Activity, other specified: Secondary | ICD-10-CM | POA: Insufficient documentation

## 2012-11-30 DIAGNOSIS — M25512 Pain in left shoulder: Secondary | ICD-10-CM

## 2012-11-30 DIAGNOSIS — M129 Arthropathy, unspecified: Secondary | ICD-10-CM | POA: Insufficient documentation

## 2012-11-30 DIAGNOSIS — J45909 Unspecified asthma, uncomplicated: Secondary | ICD-10-CM | POA: Insufficient documentation

## 2012-11-30 DIAGNOSIS — Z79899 Other long term (current) drug therapy: Secondary | ICD-10-CM | POA: Insufficient documentation

## 2012-11-30 DIAGNOSIS — S0993XA Unspecified injury of face, initial encounter: Secondary | ICD-10-CM | POA: Insufficient documentation

## 2012-11-30 DIAGNOSIS — M25562 Pain in left knee: Secondary | ICD-10-CM

## 2012-11-30 DIAGNOSIS — Z8709 Personal history of other diseases of the respiratory system: Secondary | ICD-10-CM | POA: Insufficient documentation

## 2012-11-30 DIAGNOSIS — Z9660 Presence of unspecified orthopedic joint implant: Secondary | ICD-10-CM | POA: Insufficient documentation

## 2012-11-30 DIAGNOSIS — IMO0002 Reserved for concepts with insufficient information to code with codable children: Secondary | ICD-10-CM | POA: Insufficient documentation

## 2012-11-30 DIAGNOSIS — Z8739 Personal history of other diseases of the musculoskeletal system and connective tissue: Secondary | ICD-10-CM | POA: Insufficient documentation

## 2012-11-30 DIAGNOSIS — W010XXA Fall on same level from slipping, tripping and stumbling without subsequent striking against object, initial encounter: Secondary | ICD-10-CM | POA: Insufficient documentation

## 2012-11-30 DIAGNOSIS — I10 Essential (primary) hypertension: Secondary | ICD-10-CM | POA: Insufficient documentation

## 2012-11-30 DIAGNOSIS — S8990XA Unspecified injury of unspecified lower leg, initial encounter: Secondary | ICD-10-CM | POA: Insufficient documentation

## 2012-11-30 DIAGNOSIS — Z9889 Other specified postprocedural states: Secondary | ICD-10-CM | POA: Insufficient documentation

## 2012-11-30 DIAGNOSIS — Z8669 Personal history of other diseases of the nervous system and sense organs: Secondary | ICD-10-CM | POA: Insufficient documentation

## 2012-11-30 DIAGNOSIS — Y92009 Unspecified place in unspecified non-institutional (private) residence as the place of occurrence of the external cause: Secondary | ICD-10-CM | POA: Insufficient documentation

## 2012-11-30 DIAGNOSIS — S5010XA Contusion of unspecified forearm, initial encounter: Secondary | ICD-10-CM | POA: Insufficient documentation

## 2012-11-30 DIAGNOSIS — G8929 Other chronic pain: Secondary | ICD-10-CM | POA: Insufficient documentation

## 2012-11-30 DIAGNOSIS — M542 Cervicalgia: Secondary | ICD-10-CM

## 2012-11-30 MED ORDER — OXYCODONE-ACETAMINOPHEN 5-325 MG PO TABS: 1.0000 | ORAL_TABLET | ORAL | Status: DC | PRN

## 2012-11-30 MED ORDER — HYDROMORPHONE HCL PF 2 MG/ML IJ SOLN
2.0000 mg | Freq: Once | INTRAMUSCULAR | Status: AC
  Administered 2012-11-30: 2 mg via INTRAMUSCULAR
  Filled 2012-11-30: qty 1

## 2012-11-30 MED ORDER — METHOCARBAMOL 500 MG PO TABS: 500.0000 mg | ORAL_TABLET | Freq: Two times a day (BID) | ORAL | Status: DC | PRN

## 2012-11-30 NOTE — ED Notes (Signed)
WUJ:[id number]<JY> Expected date:<BR> Expected time:<BR> Means of arrival:<BR> Comments:<BR> Fell down steps- hip/knee pain

## 2012-11-30 NOTE — ED Notes (Signed)
Pt states she lost her footing and fell down 16 wooden steps inside her home.  Pt states she essentially slid down the steps on her back.  Pt states on the way down she hit her neck, shoulders and head.  Pt denies LOC as well as N/V and blurred vision.  Pt c/o pain to left shoulder and has a small bruise to left deltoid.  Pt most responsive to movement when left shoulder moved by placing left arm across her body to right shoulder.  No obvious deformity noted, but pt's weight makes assessing for dislocation difficult.  Pt also has small abrasion to left lateral hand.  Pt arrived on LSB and c-spine immobilized.  EDP Jacubowitz removed pt from board.  C-collar remains on.

## 2012-11-30 NOTE — ED Notes (Signed)
Pt states pain has "eased off a little," but she remains in pain in left shoulder and back.

## 2012-11-30 NOTE — ED Notes (Addendum)
Per EMS - pt comes from home after falling down 16 wooden interior steps @ 45 min PTA.  Pt slipped and fell.  Pt denies LOC.  Pt states she hit her head and neck during fall.  Pt denies N/V.  No deformities apparent, but pt has small abrasion on left hand.  BP 170/100, HR 100 and RR 20.  No neuro deficits, PERRL

## 2012-11-30 NOTE — ED Notes (Signed)
Patient transported to X-ray 

## 2012-11-30 NOTE — ED Provider Notes (Signed)
History     CSN: [id number]  Arrival date & time 11/30/12  1916   First MD Initiated Contact with Patient 11/30/12 1918      Chief Complaint  Patient presents with  . Fall  . Shoulder Pain  . Neck Pain  . Hip Pain  . Knee Pain  . Back Pain    (Consider location/radiation/quality/duration/timing/severity/associated sxs/prior treatment) HPI Comments: Pt presents to the ED via EMS for a mechanical fall PTA.  Patient states she was walking up her steps in socks and when she reached the top step she slipped, lost her balance, and fell backwards down the stairs.  Patient fell down approximately 16 wooden steps, she notes that she fell onto her back. She did hit her head but did not experience loss of consciousness. Patient is not currently on blood thinners.  Patient now complaining of posterior neck pain, left shoulder pain, bilateral knee pain, and low back pain.  Prior left TKA approximately 2 years ago. Denies any numbness or paresthesias of the extremities. Denies any loss of bowel or bladder function. Patient has not taken any medications prior to arrival.  Patient is established with orthopedic in [place].  No chest pain, SOB, rib pain, pain with inspiration, abdominal pain, nausea, or vomiting.  Patient is a 52 y.o. female presenting with fall, shoulder pain, neck pain, hip pain, knee pain, and back pain. The history is provided by the patient.  Fall  Shoulder Pain Associated symptoms include arthralgias and neck pain.  Neck Pain Hip Pain Associated symptoms include arthralgias and neck pain.  Knee Pain Associated symptoms: back pain and neck pain   Back Pain   Past Medical History  Diagnosis Date  . Asthma   . Hypertension   . Angina   . Osteoarthritis of both knees   . Exertional dyspnea 12/01/11  . Borderline diabetes   . Headache 12/02/11    "sometimes when I wake up in am; real bad"  . Arthritis   . Chronic lower back pain   . Tachycardia     Past Surgical History    Procedure Laterality Date  . Joint replacement    . Total knee arthroplasty  03/02/2011    left  . Tonsillectomy      "I was a little girl"  . Tubal ligation  1982    Family History  Problem Relation Age of Onset  . Diabetes Mother   . Diabetes Father   . Diabetes Sister   . Diabetes Brother     History  Substance Use Topics  . Smoking status: Never Smoker   . Smokeless tobacco: Never Used     Comment: 12/02/11 "around second hand smoke"  . Alcohol Use: No    OB History   Grav Para Term Preterm Abortions TAB SAB Ect Mult Living                  Review of Systems  HENT: Positive for neck pain.   Musculoskeletal: Positive for back pain and arthralgias.  All other systems reviewed and are negative.    Allergies  Review of patient's allergies indicates no known allergies.  Home Medications   Current Outpatient Rx  Name  Route  Sig  Dispense  Refill  . albuterol (PROAIR HFA) 108 (90 BASE) MCG/ACT inhaler   Inhalation   Inhale 2 puffs into the lungs every 4 (four) hours as needed. Shortness of breath   1 Inhaler   5   . cetirizine (ZYRTEC)  10 MG tablet   Oral   Take 1 tablet (10 mg total) by mouth daily.   90 tablet   5   . fluticasone (FLONASE) 50 MCG/ACT nasal spray   Nasal   Place 2 sprays into the nose daily.   16 g   11   . hydrochlorothiazide (HYDRODIURIL) 25 MG tablet   Oral   Take 1 tablet (25 mg total) by mouth daily.   90 tablet   3   . nortriptyline (PAMELOR) 25 MG capsule   Oral   Take 2 capsules (50 mg total) by mouth at bedtime.   60 capsule   11   . Oxycodone HCl 10 MG TABS   Oral   Take 1 tablet (10 mg total) by mouth 3 (three) times daily.   90 tablet   0   . Oxycodone HCl 10 MG TABS   Oral   Take 1 tablet (10 mg total) by mouth 3 (three) times daily. Fill 60 days from date on rx   90 tablet   0   . Oxycodone HCl 10 MG TABS   Oral   Take 1 tablet (10 mg total) by mouth 3 (three) times daily. Fill 30 days from date on  rx   90 tablet   0   . PARoxetine (PAXIL) 20 MG tablet   Oral   Take 1 tablet (20 mg total) by mouth every morning.   90 tablet   2   . traMADol (ULTRAM) 50 MG tablet   Oral   Take 1 tablet (50 mg total) by mouth 2 (two) times daily.   60 tablet   5     BP 123/73  Pulse 113  Temp(Src) 98.7 F (37.1 C) (Oral)  Resp 22  Wt 319 lb (144.697 kg)  BMI 48.51 kg/m2  SpO2 99%  Physical Exam  Nursing note and vitals reviewed. Constitutional: She is oriented to person, place, and time.  Morbidly obese  HENT:  Head: Normocephalic and atraumatic. Head is without raccoon's eyes, without Battle's sign, without abrasion and without contusion.  Mouth/Throat: Oropharynx is clear and moist.  No signs of trauma  Eyes: Conjunctivae and EOM are normal. Pupils are equal, round, and reactive to light.  Cardiovascular: Normal rate, regular rhythm and normal heart sounds.   Pulmonary/Chest: Effort normal and breath sounds normal. She has no wheezes.  Abdominal: Soft. Bowel sounds are normal. There is no tenderness. There is no guarding.  Musculoskeletal:       Left shoulder: She exhibits tenderness, bony tenderness and pain. She exhibits normal range of motion, no spasm and normal pulse.       Right knee: She exhibits decreased range of motion. She exhibits no swelling, no effusion, no ecchymosis and no deformity. Tenderness (diffuse) found.       Left knee: She exhibits decreased range of motion and swelling. She exhibits no deformity and no laceration. Tenderness (diffuse) found.       Cervical back: She exhibits tenderness, bony tenderness, pain and spasm. She exhibits normal range of motion, no deformity, no laceration and normal pulse.       Lumbar back: She exhibits tenderness, bony tenderness (mild) and pain. She exhibits normal range of motion, no swelling, no edema, no deformity and no laceration.       Back:  Some bruising and abrasions noted to left shoulder and forearm; Strong distal  pulses and cap refill UE and LE, sensation intact all extremities  Neurological: She is  alert and oriented to person, place, and time. She has normal strength. No cranial nerve deficit or sensory deficit.  No acute neuro deficits or facial droop appreciated  Skin: Skin is warm and dry.  Psychiatric: She has a normal mood and affect.    ED Course  Procedures (including critical care time)  Labs Reviewed - No data to display Dg Lumbar Spine Complete  11/30/2012  *RADIOLOGY REPORT*  Clinical Data:  Fall, shoulder neck knee and back pain  LUMBAR SPINE - COMPLETE 4+ VIEW  Comparison: Prior radiographs of the lumbosacral spine 09/09/2011  Findings: Frontal, lateral and bilateral oblique radiographs of the lumbosacral spine demonstrates no evidence of acute fracture or malalignment.  Vertebral body heights and intervertebral disc spaces are largely maintained.  There is some mild focal degenerative disc disease at T11-T12.  Mild facet arthropathy noted at L5-S1.  Visualized bowel gas pattern is unremarkable.  The visualized bony pelvis appears intact.  IMPRESSION:  1.  No acute fracture or malalignment. 2.  Mild right greater than left facet arthropathy at L5-S1.   Original Report Authenticated By: Malachy Moan, M.D.    Ct Cervical Spine Wo Contrast  11/30/2012  *RADIOLOGY REPORT*  Clinical Data: Neck and left shoulder pain following a fall.  CT CERVICAL SPINE WITHOUT CONTRAST  Technique:  Multidetector CT imaging of the cervical spine was performed. Multiplanar CT image reconstructions were also generated.  Comparison: None.  Findings: Mild reversal of the normal cervical lordosis.  Disc space narrowing with a vacuum phenomenon extending into a Schmorl's node at the C5-6 level with moderate anterior and mild posterior spur formation.  No prevertebral soft tissue swelling, fractures or subluxations.  IMPRESSION:  1.  No fracture or subluxation. 2.  Mild reversal of the normal cervical lordosis. 3.   Degenerative changes at the C5-6 level.   Original Report Authenticated By: Beckie Salts, M.D.    Dg Shoulder Left  11/30/2012  *RADIOLOGY REPORT*  Clinical Data: Fall, shoulder, back and knee pain  LEFT SHOULDER - 2+ VIEW  Comparison: None.  Findings: No acute fracture or malalignment.  The humeral head is located with respect to the glenoid on the scapular Y view.  The acromioclavicular joint appears congruent.  Bony mineralization is within normal limits.  No focal soft tissue abnormality. Visualized thorax is unremarkable.  IMPRESSION: Negative radiographs of the left shoulder.   Original Report Authenticated By: Malachy Moan, M.D.    Dg Knee Complete 4 Views Left  11/30/2012  *RADIOLOGY REPORT*  Clinical Data: Fall  LEFT KNEE - COMPLETE 4+ VIEW  Comparison: 01/18/2010  Findings: Four views of the left knee submitted.  No acute fracture or subluxation.  The patient is status post left knee hemiarthroplasty  IMPRESSION: No acute fracture or subluxation.  Medial compartment prosthesis.   Original Report Authenticated By: Natasha Mead, M.D.    Dg Knee Complete 4 Views Right  11/30/2012  *RADIOLOGY REPORT*  Clinical Data: Fall  RIGHT KNEE - COMPLETE 4+ VIEW  Comparison: None.  Findings: Four views of the right knee submitted.  No acute fracture or subluxation.  Mild narrowing of medial joint compartment.  Narrowing of patellofemoral joint space.  IMPRESSION: No acute fracture or subluxation.  Mild degenerative changes.   Original Report Authenticated By: Natasha Mead, M.D.      1. Fall at home, initial encounter   2. Knee pain, bilateral   3. Neck pain   4. Shoulder pain, acute, left       MDM  Patient presented to the ED for mechanical fall at home prior to arrival. Patient states she was walking up steps lost her balance and fell backwards down a flight of stairs.  Patient alert and oriented, NAD, no acute neuro deficits.  Low suspicion for SAH, intracranial bleed, or skull fx- CT head deferred  at this time period  Imaging as above, all negative for acute fracture or dislocation.  Pain improved with 2 mg Dilaudid.  Patient has cane and walker at home that I have instructed her to use to help with ambulation for the next few days. Instructed to wear nonskid footwear to avoid further falls. If pain does not improve, she will followup with orthopedic in [place]. Rx Percocet and Robaxin. Discussed plan with patient and her family, they agreed. Return precautions advised.       Garlon Hatchet, PA-C 12/01/12 [phone]

## 2012-12-03 NOTE — ED Provider Notes (Signed)
Medical screening examination/treatment/procedure(s) were performed by non-physician practitioner and as supervising physician I was immediately available for consultation/collaboration.   Lyanne Co, MD 12/03/12 Marlyne Beards

## 2012-12-31 ENCOUNTER — Telehealth: Payer: Self-pay | Admitting: Family Medicine

## 2012-12-31 NOTE — Telephone Encounter (Signed)
Appt scheduled for 01/01/2013 @ 3:15pm to complete forms.  Colleen Olson

## 2012-12-31 NOTE — Telephone Encounter (Signed)
Please advise patient to make appointment soon so I can complete this paperwork with her and document a face-to-face encounter.  Thank you, Meridian Scherger M. Dandrae Kustra, M.D.

## 2012-12-31 NOTE — Telephone Encounter (Signed)
Patient dropped off FMLA forms to be filled out.  Please call her when completed. °

## 2012-12-31 NOTE — Telephone Encounter (Signed)
FMLA forms placed in Dr. Algis Downs box to complete.  Colleen Olson

## 2013-01-01 ENCOUNTER — Encounter: Payer: Self-pay | Admitting: Family Medicine

## 2013-01-01 ENCOUNTER — Ambulatory Visit (INDEPENDENT_AMBULATORY_CARE_PROVIDER_SITE_OTHER): Payer: Medicare Other | Admitting: Family Medicine

## 2013-01-01 VITALS — BP 152/90 | HR 128 | Temp 97.8°F | Ht 68.0 in | Wt 320.0 lb

## 2013-01-01 DIAGNOSIS — M1710 Unilateral primary osteoarthritis, unspecified knee: Secondary | ICD-10-CM

## 2013-01-01 NOTE — Progress Notes (Signed)
Patient ID: Colleen Olson, female   DOB: 03-22-1961, 52 y.o.   MRN: [id number]  [name] Colleen Tomeo [name], MD Phone: [phone]   Subjective: HPI: Patient is a 52 y.o. female presenting to clinic today for office visit for FMLA paperwork for her daughter's job as her care giver. She is followed by [facility] ortho and is planning on getting shots on June 12. Has had multiple falls at home down the stairs and was seen in the ED. She requires her daughter for her daily chores, as well as transportation. She has to go to [facility] frequently and cannot drive herself. She states she is using daily pain medication and muscle rubs on her knees as well. Very hopeful for knee replacement surgery.   History Reviewed: Non smoker. ROS: Please see HPI above.  Objective: Office vital signs reviewed. BP 152/90  Pulse 128  Temp(Src) 97.8 F (36.6 C) (Oral)  Ht 5\' 8"  (1.727 m)  Wt 320 lb (145.151 kg)  BMI 48.67 kg/m2  Physical Examination:  General: Awake, alert. NAD. Pleasant. HEENT: Atraumatic, normocephalic. Missing front teeth Extremities: Large legs with TTP of both knees bilaterally. No gross deformities. Trace edema  Assessment: 52 y.o. female follow up appointment for FMLA paperwork  Plan: See Problem List and After Visit Summary

## 2013-01-01 NOTE — Assessment & Plan Note (Signed)
FMLA paperwork has been completed for her daughter. I think this is necessary for her safety based on the severity of her arthritis and her falls at home. Copy of paperwork given to patient and a copy has been scanned into her EMR. Return in July for regular office visit.

## 2013-01-01 NOTE — Patient Instructions (Addendum)
It was good to see you today.   I will see you in July for your regular check up.  Rocky Gladden M. Ely Ballen, M.D. 01/01/2013 3:29 PM

## 2013-01-24 ENCOUNTER — Ambulatory Visit: Payer: Medicare Other | Admitting: Family Medicine

## 2013-02-28 ENCOUNTER — Encounter: Payer: Self-pay | Admitting: Family Medicine

## 2013-02-28 ENCOUNTER — Ambulatory Visit (INDEPENDENT_AMBULATORY_CARE_PROVIDER_SITE_OTHER): Payer: Medicare Other | Admitting: Family Medicine

## 2013-02-28 VITALS — BP 121/72 | HR 116 | Temp 98.0°F | Ht 68.0 in | Wt 316.0 lb

## 2013-02-28 DIAGNOSIS — IMO0002 Reserved for concepts with insufficient information to code with codable children: Secondary | ICD-10-CM

## 2013-02-28 DIAGNOSIS — I10 Essential (primary) hypertension: Secondary | ICD-10-CM

## 2013-02-28 DIAGNOSIS — Z79899 Other long term (current) drug therapy: Secondary | ICD-10-CM

## 2013-02-28 DIAGNOSIS — M1710 Unilateral primary osteoarthritis, unspecified knee: Secondary | ICD-10-CM

## 2013-02-28 DIAGNOSIS — J329 Chronic sinusitis, unspecified: Secondary | ICD-10-CM

## 2013-02-28 MED ORDER — OXYCODONE HCL 10 MG PO TABS: 30.0000 mg | ORAL_TABLET | Freq: Three times a day (TID) | ORAL | Status: DC

## 2013-02-28 MED ORDER — OXYCODONE HCL 10 MG PO TABS: 10.0000 mg | ORAL_TABLET | Freq: Three times a day (TID) | ORAL | Status: DC

## 2013-02-28 NOTE — Progress Notes (Signed)
Subjective:     Patient ID: Colleen Olson, female   DOB: Dec 10, 1960, 52 y.o.   MRN: [id number]  HPI This is a 52 yo woman w/ HTN, chronic pain from DJD, and hx of sinusitis in clinic for sinus issues and knee pain f/u.    HTN Well-controlled. Only monitors BP when she feels it may be low. Denies feeling this way since last visit. BP 121/72 today, compliant with medication.   Sinus issues Per pt, she has struggled with sinus issues since approx 2010. Reports sinus and chest congestion w/ dry mouth and coughing of 2 mo duration. She coughs mostly at night and occasionally wakes up from coughing. Cough episodes can be severe enough to cause chest soreness. Pt is taking lozenges for her throat and has tried zyrtec and flonase w/ some but not complete relief of symptoms. She is currently not having much sinus pain, however has experienced some in the past 2 mos.   She wonders if Tramadol may be causing or worsening her cough, as she has a daughter who also took Tramadol and experienced similar symptoms. Would like to know if she can stop her Tramadol.  DJD Pain is well-controlled with Oxycodone and Tramadol. Pain relief lasts a couple of hrs. She takes Oxycodone approx 3x/day and Tramadol 2x/day. Has had one series of synvisc one (hylan g-f 20) in both knees by Ortho. States that this has helped w/ improve her pain by 40%.  Pt is compliant with all medications. Needs refills for oxycodone.   Pt also expresses need to have labs drawn. She has not eaten anything today.   Review of Systems General: Denies generalized pain, fevers, light-headedness, dizziness, N/V. HEENT: Noted changes in hearing where she has difficulty when people are talking to her but not directly facing her. Denies ear pain and d/c. No cervical tenderness. CV/Pulm: Denies chest pain and SOB.     Objective:   Physical Exam BP 121/72  Pulse 116  Temp(Src) 98 F (36.7 C) (Oral)  Ht 5\' 8"  (1.727 m)  Wt 316 lb (143.337 kg)   BMI 48.06 kg/m2 General: NAD. A&Ox3. Speaks in full sentences and answers questions appropriately. Morbidly obese.  HEENT: NCAT. Neck supple. No cervical, pre/post-auricular, occipital, or supraclavicular lymphadenopathy. No scleral icterus or conjunctival pallor. MMM in nose. No erythema or discoloration of nasal turbinates. No septal deviation. Some mouth dryness. No tonsillar exudates, mild erythema. Right maxillary sinus tender to palpation.   CV: RRR. Normal S1 and S2. No murmurs, rubs, or gallops. Radial and pulses 2+ bilaterally. No peripheral edema.  Pulm: All lung fields clear and equal to auscultation bilaterally.     Assessment:     This is a 52 yo woman w/ well-controlled HTN and DJD pain, and hx of sinusitis w/ symptoms suggestive of exacerbation.     Plan:     See problem list for full plan     PGY-3 Addendum: I have seen and examined patient and I agree with MS3 Colleen Olson's note above with additional changes.  Amber M. Hairford, M.D.

## 2013-02-28 NOTE — Patient Instructions (Addendum)
It was good to see you today.  It is ok to stop the Tramadol. We will arrange for you to see the ENT doctor. I will send you a letter with your lab results.  I will see you back in 3 months or sooner if needed.  Rindy Kollman M. Emileo Semel, M.D.

## 2013-02-28 NOTE — Assessment & Plan Note (Signed)
Recurrent sinusitis -  Maxillary sinus tenderness, congestion, and cough are consistent w/ sinusitis. Given chronicity and recurrence a referral to ENT will be made to evaluate pt's sinuses. Additionally, will discontinue Tramadol for now to see if this helps w/ the cough

## 2013-02-28 NOTE — Assessment & Plan Note (Signed)
HTN/Labs - well-controlled. Continue current med regimen. Blood will be drawn for BMET, CBC, and lipid profile. Pt will be sent a letter w/ results. Pt f/u in 3 months or sooner if needed.

## 2013-02-28 NOTE — Assessment & Plan Note (Signed)
  DJD pain bilaterally - Currently well-controlled w/ oxycodone and synvisc injection. Will refill oxycodone HCl for next three months and have pt continue current regimen.

## 2013-03-01 ENCOUNTER — Encounter: Payer: Self-pay | Admitting: Family Medicine

## 2013-03-01 LAB — CBC
MCHC: 33.4 g/dL (ref 30.0–36.0)
Platelets: 138 10*3/uL — ABNORMAL LOW (ref 150–400)
RDW: 15.3 % (ref 11.5–15.5)
WBC: 7.2 10*3/uL (ref 4.0–10.5)

## 2013-03-01 LAB — LIPID PANEL
Cholesterol: 132 mg/dL (ref 0–200)
HDL: 23 mg/dL — ABNORMAL LOW (ref >39)
LDL Cholesterol: 86 mg/dL (ref 0–99)
Triglycerides: 113 mg/dL (ref <150)
VLDL: 23 mg/dL (ref 0–40)

## 2013-03-01 LAB — BASIC METABOLIC PANEL
BUN: 4 mg/dL — ABNORMAL LOW (ref 6–23)
Chloride: 98 mEq/L (ref 96–112)
Creat: 0.79 mg/dL (ref 0.50–1.10)
Potassium: 3.2 mEq/L — ABNORMAL LOW (ref 3.5–5.3)

## 2013-04-07 ENCOUNTER — Encounter (HOSPITAL_COMMUNITY): Payer: Self-pay | Admitting: Emergency Medicine

## 2013-04-07 ENCOUNTER — Observation Stay (HOSPITAL_COMMUNITY): Payer: Medicare Other

## 2013-04-07 ENCOUNTER — Inpatient Hospital Stay (HOSPITAL_COMMUNITY)
Admission: EM | Admit: 2013-04-07 | Discharge: 2013-04-10 | DRG: 433 | Disposition: A | Discharge status: Home / Self Care | Payer: Medicare Other | Attending: Family Medicine | Admitting: Family Medicine

## 2013-04-07 ENCOUNTER — Emergency Department (HOSPITAL_COMMUNITY): Payer: Medicare Other

## 2013-04-07 DIAGNOSIS — K838 Other specified diseases of biliary tract: Secondary | ICD-10-CM | POA: Diagnosis present

## 2013-04-07 DIAGNOSIS — Z6841 Body Mass Index (BMI) 40.0 and over, adult: Secondary | ICD-10-CM

## 2013-04-07 DIAGNOSIS — J309 Allergic rhinitis, unspecified: Secondary | ICD-10-CM | POA: Diagnosis present

## 2013-04-07 DIAGNOSIS — E876 Hypokalemia: Secondary | ICD-10-CM | POA: Diagnosis present

## 2013-04-07 DIAGNOSIS — Z96659 Presence of unspecified artificial knee joint: Secondary | ICD-10-CM

## 2013-04-07 DIAGNOSIS — M1710 Unilateral primary osteoarthritis, unspecified knee: Secondary | ICD-10-CM | POA: Diagnosis present

## 2013-04-07 DIAGNOSIS — E8809 Other disorders of plasma-protein metabolism, not elsewhere classified: Secondary | ICD-10-CM | POA: Diagnosis present

## 2013-04-07 DIAGNOSIS — R791 Abnormal coagulation profile: Secondary | ICD-10-CM | POA: Diagnosis present

## 2013-04-07 DIAGNOSIS — D649 Anemia, unspecified: Secondary | ICD-10-CM | POA: Diagnosis present

## 2013-04-07 DIAGNOSIS — J45909 Unspecified asthma, uncomplicated: Secondary | ICD-10-CM | POA: Diagnosis present

## 2013-04-07 DIAGNOSIS — D696 Thrombocytopenia, unspecified: Secondary | ICD-10-CM | POA: Diagnosis present

## 2013-04-07 DIAGNOSIS — K7010 Alcoholic hepatitis without ascites: Principal | ICD-10-CM | POA: Diagnosis present

## 2013-04-07 DIAGNOSIS — F1020 Alcohol dependence, uncomplicated: Secondary | ICD-10-CM

## 2013-04-07 DIAGNOSIS — I498 Other specified cardiac arrhythmias: Secondary | ICD-10-CM | POA: Diagnosis present

## 2013-04-07 DIAGNOSIS — R600 Localized edema: Secondary | ICD-10-CM

## 2013-04-07 DIAGNOSIS — E871 Hypo-osmolality and hyponatremia: Secondary | ICD-10-CM | POA: Diagnosis present

## 2013-04-07 DIAGNOSIS — R748 Abnormal levels of other serum enzymes: Secondary | ICD-10-CM

## 2013-04-07 DIAGNOSIS — B1920 Unspecified viral hepatitis C without hepatic coma: Secondary | ICD-10-CM | POA: Diagnosis present

## 2013-04-07 DIAGNOSIS — R945 Abnormal results of liver function studies: Secondary | ICD-10-CM

## 2013-04-07 DIAGNOSIS — R17 Unspecified jaundice: Secondary | ICD-10-CM

## 2013-04-07 DIAGNOSIS — I10 Essential (primary) hypertension: Secondary | ICD-10-CM | POA: Diagnosis present

## 2013-04-07 DIAGNOSIS — R0600 Dyspnea, unspecified: Secondary | ICD-10-CM | POA: Diagnosis present

## 2013-04-07 LAB — CBC WITH DIFFERENTIAL/PLATELET
Basophils Absolute: 0.1 10*3/uL (ref 0.0–0.1)
Eosinophils Relative: 1 % (ref 0–5)
Lymphocytes Relative: 34 % (ref 12–46)
Lymphs Abs: 2.2 10*3/uL (ref 0.7–4.0)
Monocytes Relative: 10 % (ref 3–12)
Neutrophils Relative %: 54 % (ref 43–77)
Platelets: 79 10*3/uL — ABNORMAL LOW (ref 150–400)
RBC: 3.3 MIL/uL — ABNORMAL LOW (ref 3.87–5.11)
RDW: 18.4 % — ABNORMAL HIGH (ref 11.5–15.5)
WBC: 6.6 10*3/uL (ref 4.0–10.5)

## 2013-04-07 LAB — RETICULOCYTES
RBC.: 3.28 MIL/uL — ABNORMAL LOW (ref 3.87–5.11)
Retic Count, Absolute: 111.5 10*3/uL (ref 19.0–186.0)
Retic Ct Pct: 3.4 % — ABNORMAL HIGH (ref 0.4–3.1)

## 2013-04-07 LAB — COMPREHENSIVE METABOLIC PANEL
Albumin: 2.2 g/dL — ABNORMAL LOW (ref 3.5–5.2)
Alkaline Phosphatase: 300 U/L — ABNORMAL HIGH (ref 39–117)
BUN: 4 mg/dL — ABNORMAL LOW (ref 6–23)
Chloride: 95 mEq/L — ABNORMAL LOW (ref 96–112)
Creatinine, Ser: 0.66 mg/dL (ref 0.50–1.10)
GFR calc Af Amer: >90 mL/min (ref >90)
GFR calc non Af Amer: >90 mL/min (ref >90)
Glucose, Bld: 100 mg/dL — ABNORMAL HIGH (ref 70–99)
Potassium: 3.2 mEq/L — ABNORMAL LOW (ref 3.5–5.1)
Total Bilirubin: 6.5 mg/dL — ABNORMAL HIGH (ref 0.3–1.2)

## 2013-04-07 LAB — PROTIME-INR
INR: 1.54 — ABNORMAL HIGH (ref 0.00–1.49)
Prothrombin Time: 18.1 seconds — ABNORMAL HIGH (ref 11.6–15.2)

## 2013-04-07 LAB — MAGNESIUM: Magnesium: 1.9 mg/dL (ref 1.5–2.5)

## 2013-04-07 LAB — PRO B NATRIURETIC PEPTIDE: Pro B Natriuretic peptide (BNP): 54.4 pg/mL (ref 0–125)

## 2013-04-07 LAB — BILIRUBIN, FRACTIONATED(TOT/DIR/INDIR): Indirect Bilirubin: 2.5 mg/dL — ABNORMAL HIGH (ref 0.3–0.9)

## 2013-04-07 MED ORDER — ALBUTEROL SULFATE HFA 108 (90 BASE) MCG/ACT IN AERS
2.0000 | INHALATION_SPRAY | Freq: Four times a day (QID) | RESPIRATORY_TRACT | Status: DC | PRN
  Administered 2013-04-10: 11:00:00 2 via RESPIRATORY_TRACT
  Filled 2013-04-07 (×2): qty 6.7

## 2013-04-07 MED ORDER — GUAIFENESIN-DM 100-10 MG/5ML PO SYRP
5.0000 mL | ORAL_SOLUTION | ORAL | Status: DC | PRN
  Administered 2013-04-08 (×2): 5 mL via ORAL
  Filled 2013-04-07: qty 5
  Filled 2013-04-07: qty 10

## 2013-04-07 MED ORDER — ONDANSETRON HCL 4 MG PO TABS: 4.0000 mg | ORAL_TABLET | Freq: Four times a day (QID) | ORAL | Status: DC | PRN

## 2013-04-07 MED ORDER — LORATADINE 10 MG PO TABS
10.0000 mg | ORAL_TABLET | Freq: Every day | ORAL | Status: DC
  Administered 2013-04-07 – 2013-04-10 (×4): 10 mg via ORAL
  Filled 2013-04-07 (×4): qty 1

## 2013-04-07 MED ORDER — CALCIUM CARBONATE 1250 (500 CA) MG PO TABS
1.0000 | ORAL_TABLET | Freq: Every day | ORAL | Status: DC
  Administered 2013-04-08 – 2013-04-10 (×3): 500 mg via ORAL
  Filled 2013-04-07 (×4): qty 1

## 2013-04-07 MED ORDER — NORTRIPTYLINE HCL 25 MG PO CAPS
50.0000 mg | ORAL_CAPSULE | Freq: Every day | ORAL | Status: DC
  Administered 2013-04-08: 22:00:00 50 mg via ORAL
  Filled 2013-04-07 (×3): qty 2

## 2013-04-07 MED ORDER — POTASSIUM CHLORIDE CRYS ER 20 MEQ PO TBCR
40.0000 meq | EXTENDED_RELEASE_TABLET | Freq: Once | ORAL | Status: AC
  Administered 2013-04-07: 40 meq via ORAL
  Filled 2013-04-07: qty 2

## 2013-04-07 MED ORDER — MORPHINE SULFATE 2 MG/ML IJ SOLN
1.0000 mg | INTRAMUSCULAR | Status: DC | PRN
  Administered 2013-04-08 (×2): 1 mg via INTRAVENOUS
  Filled 2013-04-07 (×3): qty 1

## 2013-04-07 MED ORDER — IOHEXOL 350 MG/ML SOLN
100.0000 mL | Freq: Once | INTRAVENOUS | Status: AC | PRN
  Administered 2013-04-07: 100 mL via INTRAVENOUS

## 2013-04-07 MED ORDER — PAROXETINE HCL 20 MG PO TABS
20.0000 mg | ORAL_TABLET | Freq: Every day | ORAL | Status: DC
  Administered 2013-04-07 – 2013-04-10 (×4): 20 mg via ORAL
  Filled 2013-04-07 (×4): qty 1

## 2013-04-07 MED ORDER — ONDANSETRON HCL 4 MG/2ML IJ SOLN: 4.0000 mg | Freq: Four times a day (QID) | INTRAMUSCULAR | Status: DC | PRN

## 2013-04-07 MED ORDER — SODIUM CHLORIDE 0.9 % IJ SOLN
3.0000 mL | Freq: Two times a day (BID) | INTRAMUSCULAR | Status: DC
  Administered 2013-04-07 – 2013-04-10 (×5): 3 mL via INTRAVENOUS

## 2013-04-07 MED ORDER — ONDANSETRON HCL 4 MG/2ML IJ SOLN: 4.0000 mg | Freq: Three times a day (TID) | INTRAMUSCULAR | Status: DC | PRN

## 2013-04-07 MED ORDER — OXYCODONE HCL 5 MG PO TABS: 10.0000 mg | ORAL_TABLET | Freq: Three times a day (TID) | ORAL | Status: DC | PRN

## 2013-04-07 NOTE — ED Provider Notes (Signed)
CSN: [id number]     Arrival date & time 04/07/13  1346 History   First MD Initiated Contact with Patient 04/07/13 1500     Chief Complaint  Patient presents with  . Leg Swelling  . coughing up blood    (Consider location/radiation/quality/duration/timing/severity/associated sxs/prior Treatment) HPI Comments: 52 yo female with htn, arthritis, obesity hx presents with leg swelling, sob since Tuesday. No hx of similar. No known kidney or heart issues.  Pt does drink etoh daily for years.  Jaundice of eyes noticed recently and mild wt gain.  Nothing improves sxs, no hx of similar.  Sob with exertion.  No known liver issues.  The history is provided by the patient.    Past Medical History  Diagnosis Date  . Asthma   . Hypertension   . Angina   . Osteoarthritis of both knees   . Exertional dyspnea 12/01/11  . Borderline diabetes   . Headache(784.0) 12/02/11    "sometimes when I wake up in am; real bad"  . Arthritis   . Chronic lower back pain   . Tachycardia    Past Surgical History  Procedure Laterality Date  . Joint replacement    . Total knee arthroplasty  03/02/2011    left  . Tonsillectomy      "I was a little girl"  . Tubal ligation  1982   Family History  Problem Relation Age of Onset  . Diabetes Mother   . Diabetes Father   . Diabetes Sister   . Diabetes Brother    History  Substance Use Topics  . Smoking status: Never Smoker   . Smokeless tobacco: Never Used     Comment: 12/02/11 "around second hand smoke"  . Alcohol Use: Yes     Comment: 3-5 40oz. beers per day   OB History   Grav Para Term Preterm Abortions TAB SAB Ect Mult Living                 Review of Systems  Constitutional: Positive for fatigue. Negative for fever and chills.  HENT: Negative for neck pain and neck stiffness.   Eyes: Negative for visual disturbance.  Respiratory: Positive for shortness of breath.   Cardiovascular: Positive for leg swelling. Negative for chest pain.  Gastrointestinal:  Negative for vomiting and abdominal pain.  Genitourinary: Negative for dysuria and flank pain.  Musculoskeletal: Negative for back pain.  Skin: Negative for rash.  Neurological: Negative for light-headedness and headaches.    Allergies  Review of patient's allergies indicates no known allergies.  Home Medications   Current Outpatient Rx  Name  Route  Sig  Dispense  Refill  . albuterol (PROVENTIL HFA;VENTOLIN HFA) 108 (90 BASE) MCG/ACT inhaler   Inhalation   Inhale 2 puffs into the lungs every 6 (six) hours as needed for wheezing.         . calcium carbonate (OS-CAL - DOSED IN MG OF ELEMENTAL CALCIUM) 1250 MG tablet   Oral   Take 1 tablet by mouth daily.         . cetirizine (ZYRTEC) 10 MG tablet   Oral   Take 10 mg by mouth daily.         . fluticasone (FLONASE) 50 MCG/ACT nasal spray   Nasal   Place 2 sprays into the nose daily.         . hydrochlorothiazide (HYDRODIURIL) 25 MG tablet   Oral   Take 25 mg by mouth daily.         Marland Kitchen  nortriptyline (PAMELOR) 25 MG capsule   Oral   Take 50 mg by mouth at bedtime.         . Oxycodone HCl 10 MG TABS   Oral   Take 10 mg by mouth 3 (three) times daily as needed.         Marland Kitchen PARoxetine (PAXIL) 20 MG tablet   Oral   Take 20 mg by mouth daily.          BP 128/67  Pulse 113  Temp(Src) 98.5 F (36.9 C) (Oral)  Resp 21  SpO2 95% Physical Exam  Nursing note and vitals reviewed. Constitutional: She is oriented to person, place, and time. She appears well-developed and well-nourished. No distress.  HENT:  Head: Normocephalic and atraumatic.  Eyes: Conjunctivae are normal. Right eye exhibits no discharge. Left eye exhibits no discharge. Scleral icterus is present.  Neck: Normal range of motion. Neck supple. No tracheal deviation present.  Cardiovascular: Regular rhythm.  Tachycardia present.   Pulmonary/Chest: Effort normal and breath sounds normal. No respiratory distress.  Abdominal: Soft. She exhibits no  distension. There is no tenderness. There is no guarding.  Musculoskeletal: She exhibits edema (3+ bilateral LE ext to lower abd). She exhibits no tenderness.  Neurological: She is alert and oriented to person, place, and time. No cranial nerve deficit.  Skin: Skin is warm. No rash noted.  Psychiatric: She has a normal mood and affect.    ED Course  Procedures (including critical care time) Labs Review Labs Reviewed  COMPREHENSIVE METABOLIC PANEL - Abnormal; Notable for the following:    Sodium 129 (*)    Potassium 3.2 (*)    Chloride 95 (*)    Glucose, Bld 100 (*)    BUN 4 (*)    Albumin 2.2 (*)    AST 142 (*)    ALT 56 (*)    Alkaline Phosphatase 300 (*)    Total Bilirubin 6.5 (*)    All other components within normal limits  CBC WITH DIFFERENTIAL - Abnormal; Notable for the following:    RBC 3.30 (*)    Hemoglobin 10.2 (*)    HCT 28.9 (*)    RDW 18.4 (*)    Platelets 79 (*)    All other components within normal limits  PROTIME-INR - Abnormal; Notable for the following:    Prothrombin Time 18.1 (*)    INR 1.54 (*)    All other components within normal limits  RETICULOCYTES - Abnormal; Notable for the following:    Retic Ct Pct 3.4 (*)    RBC. 3.28 (*)    All other components within normal limits  LACTATE DEHYDROGENASE - Abnormal; Notable for the following:    LDH 437 (*)    All other components within normal limits  BILIRUBIN, FRACTIONATED(TOT/DIR/INDIR) - Abnormal; Notable for the following:    Total Bilirubin 6.5 (*)    Bilirubin, Direct 4.0 (*)    Indirect Bilirubin 2.5 (*)    All other components within normal limits  GAMMA GT - Abnormal; Notable for the following:    GGT 283 (*)    All other components within normal limits  TROPONIN I  PRO B NATRIURETIC PEPTIDE  TROPONIN I  MAGNESIUM  PHOSPHORUS  CBC  COMPREHENSIVE METABOLIC PANEL  TROPONIN I  TROPONIN I  TSH  PROTIME-INR  HEPATITIS PANEL, ACUTE  HAPTOGLOBIN   Imaging Review Dg Chest 2  View  04/07/2013   *RADIOLOGY REPORT*  Clinical Data: Lower extremity swelling.  Hypertension.  Nonsmoker.  CHEST - 2 VIEW  Comparison: 12/01/2011  Findings: Lateral view degraded by patient body habitus and overlying arms.  Degraded AP portable view secondary to technique and patient body habitus.  Heart size upper normal, accentuated by low lung volumes. Moderate right hemidiaphragm elevation.  No definite pleural fluid. No pneumothorax.  No congestive failure.  Low lung volumes with resultant pulmonary interstitial prominence. Mild left base scarring or atelectasis, similar.  IMPRESSION: Cardiomegaly and low lung volumes, without acute disease.  Both views degraded by technique and patient related factors.   Original Report Authenticated By: Jeronimo Greaves, M.D.   Ct Angio Chest Pe W/cm &/or Wo Cm  04/07/2013   *RADIOLOGY REPORT*  Clinical Data: Short of breath, leg swelling  CT ANGIOGRAPHY CHEST  Technique:  Multidetector CT imaging of the chest using the standard protocol during bolus administration of intravenous contrast. Multiplanar reconstructed images including MIPs were obtained and reviewed to evaluate the vascular anatomy.  Contrast: OMNIPAQUE IOHEXOL 350 MG/ML SOLN  Comparison: None.  Findings: Exam is suboptimal due to patient body habitus which attenuates the x-rays.  A second attempt was performed due to the late timing of the initial bolus.  There are no filling defects within the central main pulmonary arteries.  No filling defect within the proximal interlobar bar arteries.  The segmental arteries are not well evaluated.  No pericardial fluid.  No acute findings aorta or great vessels.  No axillary supraclavicular lymphadenopathy.  No mediastinal adenopathy.  Review of the lung parenchyma shows no pulmonary infarction or infection.  Limited view of the upper abdomen demonstrates fatty infiltration the liver.  IMPRESSION: 1.  No evidence of central pulmonary embolism on this suboptimal exam.  2.  Hepatic steatosis.   Original Report Authenticated By: Genevive Bi, M.D.   US Abdomen Limited  04/07/2013   CLINICAL DATA:  Elevated bilirubin  EXAM: LIMITED ABDOMEN ULTRASOUND FOR ASCITES  COMPARISON:  10/18/2010  FINDINGS: Body habitus reduces diagnostic sensitivity and specificity.  Gallbladder  Sludge in the gallbladder without definite gallstones. Sonographic Murphy's sign absent.  Common bile duct  Diameter: Not visualized.  Liver  Considerable sonic attenuation favors diffuse hepatic steatosis. Very limited assessment due to the attenuation of the signal.  IVC  Not visualized due to overlying bowel gas  Pancreas  Not visualized due to overlying bowel gas  Spleen  Size and appearance within normal limits.  Right Kidney  Length: Measures 12.9 cm in length Echogenicity within normal limits. No mass or hydronephrosis visualized.  Left Kidney  Length: 11.4 cm in length Echogenicity within normal limits. No mass or hydronephrosis visualized.  Abdominal aorta  Not visualized  IMPRESSION: 1. Reduced exam sensitivity secondary to body habitus and overlying bowel gas. Very structures including the IVC, common bile duct, pancreas, and abdominal aorta were not visualized. 2. Diffuse hepatic steatosis. Difficult to visualize portions of the liver due to the degradation of signal. 3. Sludge in the gallbladder.   Electronically Signed   By: Herbie Baltimore   On: 04/07/2013 19:32    MDM  No diagnosis found. Sxs likely from fluid overload with liver sxs. With hemoptysis/ sob and leg swelling CT to rule out PE.  No distress.  CT neg for PE.  Hypo K- po given.  Rechecked, pt comfortable, updated plan and results.  LFT/ bilirubin elevated [place] abd ordered in ED.   Date: 04/08/2013  Rate: 107  Rhythm: sinus tachycardia  QRS Axis: normal  Intervals: QT prolonged  ST/T Wave  abnormalities: nonspecific ST changes  Conduction Disutrbances:nonspecific intraventricular conduction delay  Narrative  Interpretation:   Old EKG Reviewed: no longer ST depression lateral, IVCD new   With dyspnea, hemoptysis, liver abnormalities, chest pain plan for admission to hospital. Spoke with hospitalist, agreed with observation for further evaluation.  Hyponatremia, Dyspnea, Leg edema, LFT abnormalities   Enid Skeens, MD 04/08/13 [phone]

## 2013-04-07 NOTE — ED Notes (Signed)
Pt states bilat lower extremity swelling that started on Tuesday.  Also has bruises on her body and "I havent bumped into anything".  Pt also c/o that for several weeks when she blows her nose or coughs up phlegm there is blood.

## 2013-04-07 NOTE — H&P (Signed)
[facility] History and Physical  Emrey Thornley ZOX:[id number] DOB: 12-13-1960 DOA: 04/07/2013  Referring physician: Jodi Mourning PCP: Rodman Pickle, MD  Specialists: none  Chief Complaint: RUQ discomfort, yellow eyes, and increased swelling  HPI: Colleen Olson is a 52 y.o. female  With h/o heavy alcohol use for more than 15 years (daughter reports patient has been drinking > five 40 oz's per day).  Also daughter reports that patient tends to get agitated when she does not have a drink on a daily basis.   Pt presented to the ED after developing icterus, RUQ discomfort, and increased leg swelling for the last 2 days.  The problem has been persistent since onset and getting worse.  She is not aware of anything that makes it better or worse.  Patient reports that she blew her nose hard and saw some blood tinged snot on her tissue but denies hemoptysis.  She was also complaining of shortness of breath prior to arrival but currently denies any.  She states she has asthma which can be triggered by cigarette smoke and recently someone was smoking around her.  In the ED patient has found to have albuminemia, elevated liver enzymes, elevated alkaline phosphatase.  We were consulted for further evaluation and recommendations.  Review of Systems: 10 point review of system reviewed and negative unless listed above.  Past Medical History  Diagnosis Date  . Asthma   . Hypertension   . Angina   . Osteoarthritis of both knees   . Exertional dyspnea 12/01/11  . Borderline diabetes   . Headache(784.0) 12/02/11    "sometimes when I wake up in am; real bad"  . Arthritis   . Chronic lower back pain   . Tachycardia    Past Surgical History  Procedure Laterality Date  . Joint replacement    . Total knee arthroplasty  03/02/2011    left  . Tonsillectomy      "I was a little girl"  . Tubal ligation  1982   Social History:  reports that she has never smoked. She has never used smokeless tobacco. She  reports that  drinks alcohol. She reports that she does not use illicit drugs. Lives at home with family  Can patient participate in ADLs? yes  No Known Allergies  Family History  Problem Relation Age of Onset  . Diabetes Mother   . Diabetes Father   . Diabetes Sister   . Diabetes Brother   None other reported.  Prior to Admission medications   Medication Sig Start Date End Date Taking? Authorizing Provider  albuterol (PROVENTIL HFA;VENTOLIN HFA) 108 (90 BASE) MCG/ACT inhaler Inhale 2 puffs into the lungs every 6 (six) hours as needed for wheezing.   Yes Historical Provider, MD  calcium carbonate (OS-CAL - DOSED IN MG OF ELEMENTAL CALCIUM) 1250 MG tablet Take 1 tablet by mouth daily.   Yes Historical Provider, MD  cetirizine (ZYRTEC) 10 MG tablet Take 10 mg by mouth daily.   Yes Historical Provider, MD  fluticasone (FLONASE) 50 MCG/ACT nasal spray Place 2 sprays into the nose daily.   Yes Historical Provider, MD  hydrochlorothiazide (HYDRODIURIL) 25 MG tablet Take 25 mg by mouth daily.   Yes Historical Provider, MD  nortriptyline (PAMELOR) 25 MG capsule Take 50 mg by mouth at bedtime. 11/27/12  Yes Amber Nydia Bouton, MD  Oxycodone HCl 10 MG TABS Take 10 mg by mouth 3 (three) times daily as needed.   Yes Historical Provider, MD  PARoxetine (PAXIL) 20 MG tablet  Take 20 mg by mouth daily.   Yes Historical Provider, MD   Physical Exam: Filed Vitals:   04/07/13 1459  BP: 128/67  Pulse: 113  Temp:   Resp:      General:  Pt in NAD, laying in bed comfortably  Eyes: Icteric, EOMI  ENT: no masses on visual examination, MMM  Neck: supple, no goiter  Cardiovascular: RRR, no MRG  Respiratory: CTA BL, no wheezes  Abdomen: soft, NT, ND  Skin: warm and dry, pitting edema at LE BL  Musculoskeletal: no cyanosis or clubbing  Psychiatric: mood and affect appropriate  Neurologic: answers questions appropriately, no facial asymmetry  Labs on Admission:  Basic Metabolic  Panel:  Recent Labs Lab 04/07/13 1542  NA 129*  K 3.2*  CL 95*  CO2 22  GLUCOSE 100*  BUN 4*  CREATININE 0.66  CALCIUM 8.9   Liver Function Tests:  Recent Labs Lab 04/07/13 1542  AST 142*  ALT 56*  ALKPHOS 300*  BILITOT 6.5*  PROT 8.3  ALBUMIN 2.2*   No results found for this basename: LIPASE, AMYLASE,  in the last 168 hours No results found for this basename: AMMONIA,  in the last 168 hours CBC:  Recent Labs Lab 04/07/13 1542  WBC 6.6  NEUTROABS 3.5  HGB 10.2*  HCT 28.9*  MCV 87.6  PLT 79*   Cardiac Enzymes:  Recent Labs Lab 04/07/13 1542  TROPONINI <0.30    BNP (last 3 results)  Recent Labs  04/07/13 1600  PROBNP 54.4   CBG: No results found for this basename: GLUCAP,  in the last 168 hours  Radiological Exams on Admission: Dg Chest 2 View  04/07/2013   *RADIOLOGY REPORT*  Clinical Data: Lower extremity swelling.  Hypertension.  Nonsmoker.  CHEST - 2 VIEW  Comparison: 12/01/2011  Findings: Lateral view degraded by patient body habitus and overlying arms.  Degraded AP portable view secondary to technique and patient body habitus.  Heart size upper normal, accentuated by low lung volumes. Moderate right hemidiaphragm elevation.  No definite pleural fluid. No pneumothorax.  No congestive failure.  Low lung volumes with resultant pulmonary interstitial prominence. Mild left base scarring or atelectasis, similar.  IMPRESSION: Cardiomegaly and low lung volumes, without acute disease.  Both views degraded by technique and patient related factors.   Original Report Authenticated By: Jeronimo Greaves, M.D.   Ct Angio Chest Pe W/cm &/or Wo Cm  04/07/2013   *RADIOLOGY REPORT*  Clinical Data: Short of breath, leg swelling  CT ANGIOGRAPHY CHEST  Technique:  Multidetector CT imaging of the chest using the standard protocol during bolus administration of intravenous contrast. Multiplanar reconstructed images including MIPs were obtained and reviewed to evaluate the vascular  anatomy.  Contrast: OMNIPAQUE IOHEXOL 350 MG/ML SOLN  Comparison: None.  Findings: Exam is suboptimal due to patient body habitus which attenuates the x-rays.  A second attempt was performed due to the late timing of the initial bolus.  There are no filling defects within the central main pulmonary arteries.  No filling defect within the proximal interlobar bar arteries.  The segmental arteries are not well evaluated.  No pericardial fluid.  No acute findings aorta or great vessels.  No axillary supraclavicular lymphadenopathy.  No mediastinal adenopathy.  Review of the lung parenchyma shows no pulmonary infarction or infection.  Limited view of the upper abdomen demonstrates fatty infiltration the liver.  IMPRESSION: 1.  No evidence of central pulmonary embolism on this suboptimal exam. 2.  Hepatic steatosis.  Original Report Authenticated By: Genevive Bi, M.D.   US Abdomen Limited  04/07/2013   CLINICAL DATA:  Elevated bilirubin  EXAM: LIMITED ABDOMEN ULTRASOUND FOR ASCITES  COMPARISON:  10/18/2010  FINDINGS: Body habitus reduces diagnostic sensitivity and specificity.  Gallbladder  Sludge in the gallbladder without definite gallstones. Sonographic Murphy's sign absent.  Common bile duct  Diameter: Not visualized.  Liver  Considerable sonic attenuation favors diffuse hepatic steatosis. Very limited assessment due to the attenuation of the signal.  IVC  Not visualized due to overlying bowel gas  Pancreas  Not visualized due to overlying bowel gas  Spleen  Size and appearance within normal limits.  Right Kidney  Length: Measures 12.9 cm in length Echogenicity within normal limits. No mass or hydronephrosis visualized.  Left Kidney  Length: 11.4 cm in length Echogenicity within normal limits. No mass or hydronephrosis visualized.  Abdominal aorta  Not visualized  IMPRESSION: 1. Reduced exam sensitivity secondary to body habitus and overlying bowel gas. Very structures including the IVC, common bile duct,  pancreas, and abdominal aorta were not visualized. 2. Diffuse hepatic steatosis. Difficult to visualize portions of the liver due to the degradation of signal. 3. Sludge in the gallbladder.   Electronically Signed   By: Herbie Baltimore   On: 04/07/2013 19:32    EKG: Independently reviewed. Sinus tachycardia with no ST elevations or depressions with non specific ST wave changes  Assessment/Plan Active Problems:  1. RUQ discomfort, Elevated liver enzymes - Will obtain viral hepatitis panel - Most likely due to excessive alcohol intake.  Have recommended cessation. Family would like more information for support groups - Consider GI consultation pending work up - Maddrey's Alcoholic function calculation does not indicate need for steroid administration at this time (using high value of of PT control)  - Reassess next am - Agree with RUQ U/S - AST > ALT by more than 2 x which is suspicious for alcoholic liver disease  2. Elevated alk phosphatase level - Again agree with RUQ U/S to look for causes - obtain GGT   3. Elevated Bilirubin - obtain indirect/ direct bilirubin levels - haptoglobin, LDH, reticulocytes, and cbc  4. Asthma - stable currently no wheezes or increased work of breathing on room air. - continue albuterol home regimen.  5. Edema of both legs - Most likely due to suspected alcoholic liver disease.  Albumin levels decreased - obtain TSH levels.  6. DVT - SCD's  7. Alcoholic dependence - Recommended cessation - placed on CIWA protocol  8. Chest discomfort - Per patient was more related to RUQ discomfort.  - Initial troponin negative, will order 2 more sets of troponin as precaution  9. Hyponatremia - hold HCTZ   Code Status: full Family Communication: DIscussed with mother and daughter at bedside Disposition Plan: Pending improvement in condition.  Time spent: > 70 minutes.  Penny Pia [facility] Pager [phone]  If 7PM-7AM, please contact  night-coverage [email or web address] Password [facility] 04/07/2013, 7:58 PM

## 2013-04-07 NOTE — ED Notes (Signed)
Per patient "My legs are swollen, I got bruises up and down" Per patient's request, she put her daughter on speaker phone to allow her to tell us what has been going on with the patient. Daughter states her mom is "falling and balance her is really off, her eyes turned yellow 2 days ago, a few weeks ago she had a small scratch that wouldn't stop bleeding for hours". In addition she reports "sselling in her stomach, legs, feet, memory has been off, bruising with no explanation." Patient's daughter says that she drinks about five 40 oz's per day.

## 2013-04-08 DIAGNOSIS — R0609 Other forms of dyspnea: Secondary | ICD-10-CM

## 2013-04-08 DIAGNOSIS — E876 Hypokalemia: Secondary | ICD-10-CM | POA: Diagnosis present

## 2013-04-08 DIAGNOSIS — R0600 Dyspnea, unspecified: Secondary | ICD-10-CM | POA: Diagnosis present

## 2013-04-08 LAB — COMPREHENSIVE METABOLIC PANEL
ALT: 51 U/L — ABNORMAL HIGH (ref 0–35)
AST: 126 U/L — ABNORMAL HIGH (ref 0–37)
Alkaline Phosphatase: 253 U/L — ABNORMAL HIGH (ref 39–117)
CO2: 26 mEq/L (ref 19–32)
Calcium: 8.7 mg/dL (ref 8.4–10.5)
Chloride: 98 mEq/L (ref 96–112)
GFR calc Af Amer: >90 mL/min (ref >90)
GFR calc non Af Amer: >90 mL/min (ref >90)
Glucose, Bld: 104 mg/dL — ABNORMAL HIGH (ref 70–99)
Potassium: 3.7 mEq/L (ref 3.5–5.1)
Sodium: 131 mEq/L — ABNORMAL LOW (ref 135–145)

## 2013-04-08 LAB — CBC
Hemoglobin: 9.2 g/dL — ABNORMAL LOW (ref 12.0–15.0)
Platelets: 68 10*3/uL — ABNORMAL LOW (ref 150–400)
RBC: 3 MIL/uL — ABNORMAL LOW (ref 3.87–5.11)
WBC: 6.2 10*3/uL (ref 4.0–10.5)

## 2013-04-08 LAB — TSH: TSH: 1.769 u[IU]/mL (ref 0.350–4.500)

## 2013-04-08 LAB — HAPTOGLOBIN: Haptoglobin: <25 mg/dL — ABNORMAL LOW (ref 45–215)

## 2013-04-08 MED ORDER — LORAZEPAM 1 MG PO TABS: 1.0000 mg | ORAL_TABLET | Freq: Four times a day (QID) | ORAL | Status: DC | PRN

## 2013-04-08 MED ORDER — ADULT MULTIVITAMIN W/MINERALS CH
1.0000 | ORAL_TABLET | Freq: Every day | ORAL | Status: DC
  Administered 2013-04-08 – 2013-04-10 (×3): 1 via ORAL
  Filled 2013-04-08 (×3): qty 1

## 2013-04-08 MED ORDER — HYDROCOD POLST-CHLORPHEN POLST 10-8 MG/5ML PO LQCR
5.0000 mL | Freq: Once | ORAL | Status: AC
  Administered 2013-04-08: 5 mL via ORAL
  Filled 2013-04-08: qty 5

## 2013-04-08 MED ORDER — FOLIC ACID 1 MG PO TABS
1.0000 mg | ORAL_TABLET | Freq: Every day | ORAL | Status: DC
  Administered 2013-04-08 – 2013-04-10 (×3): 1 mg via ORAL
  Filled 2013-04-08 (×3): qty 1

## 2013-04-08 MED ORDER — THIAMINE HCL 100 MG/ML IJ SOLN
100.0000 mg | Freq: Every day | INTRAMUSCULAR | Status: DC
  Filled 2013-04-08 (×3): qty 1

## 2013-04-08 MED ORDER — LORAZEPAM 2 MG/ML IJ SOLN: 1.0000 mg | Freq: Four times a day (QID) | INTRAMUSCULAR | Status: DC | PRN

## 2013-04-08 MED ORDER — VITAMIN B-1 100 MG PO TABS
100.0000 mg | ORAL_TABLET | Freq: Every day | ORAL | Status: DC
  Administered 2013-04-08 – 2013-04-10 (×3): 100 mg via ORAL
  Filled 2013-04-08 (×3): qty 1

## 2013-04-08 MED ORDER — PHYTONADIONE 5 MG PO TABS
2.5000 mg | ORAL_TABLET | Freq: Once | ORAL | Status: AC
  Administered 2013-04-08: 20:00:00 2.5 mg via ORAL
  Filled 2013-04-08: qty 1

## 2013-04-08 MED ORDER — HYDROCOD POLST-CHLORPHEN POLST 10-8 MG/5ML PO LQCR
5.0000 mL | Freq: Two times a day (BID) | ORAL | Status: DC | PRN
  Administered 2013-04-08: 5 mL via ORAL
  Filled 2013-04-08: qty 5

## 2013-04-08 MED ORDER — FLUTICASONE PROPIONATE 50 MCG/ACT NA SUSP
2.0000 | Freq: Every day | NASAL | Status: DC
  Administered 2013-04-08 – 2013-04-10 (×2): 2 via NASAL
  Filled 2013-04-08: qty 16

## 2013-04-08 NOTE — Progress Notes (Signed)
Patient bathing leads adjusted by Blake Divine, NT

## 2013-04-08 NOTE — Progress Notes (Signed)
Telephone report given to [facility] on 5W at [facility].  Patient to be transferred via [facility] with telemetry to room 5W37C.

## 2013-04-08 NOTE — Progress Notes (Signed)
Admission note:  Arrival Method: via [facility] from [employer] Mental Orientation: A&Ox4 Telemetry: Yes, box TX16 Assessment: See docflowsheets Skin: Intact, bruising  IV: LAC SL Pain: abd and chest pain Tubes: N/A Safety Measures: Patient Handbook has been given. Left at bedside  Admission: Family Medicine MD notified of arrival 6700 Orientation: Patient has been oriented to the unit, staff and to the room.  Family: None present

## 2013-04-08 NOTE — Progress Notes (Signed)
INITIAL NUTRITION ASSESSMENT  DOCUMENTATION CODES Per approved criteria  -Morbid Obesity   INTERVENTION: - Continue daily multivitamin - recommended pt continue to take daily multivitamin at d/c r/t poor dietary habits - Will continue to monitor   NUTRITION DIAGNOSIS: Altered nutrient related lab values related to excess alcohol intake PTA as evidenced by H&P.   Goal: 1. Alcohol cessation at d/c 2. Pt to consume >90% of meals  Monitor:  Weights, labs, intake  Reason for Assessment: Nutritional risk   52 y.o. female  Admitting Dx: Nutrition risk   ASSESSMENT: Pt with history of alcohol dependence (drinks 5 40 ounce beers daily), HTN, chronic pain, sinusitis, dyspnea, and borderline DM. Met with pt and family who report pt with poor appetite for the past month with pt sometimes skipping meals. Before then pt eating well. Pt does not like nutritional supplements. Daughter reports pt eats only meat and bread. Pt's weight up r/t likely fluid retention, up 17 pounds in the past 2 months. Pt ate 100% of breakfast.   Pt getting multivitamin, folic acid, calcium carbonate, thiamin, and potassium chloride.   Pt with elevated Alk phos, AST/ALT, and bilirubin.   Height: Ht Readings from Last 1 Encounters:  02/28/13 5\' 8"  (1.727 m)    Weight: Wt Readings from Last 1 Encounters:  04/08/13 333 lb 1.6 oz (151.093 kg)    Ideal Body Weight: 140 lb   % Ideal Body Weight: 238%  Wt Readings from Last 10 Encounters:  04/08/13 333 lb 1.6 oz (151.093 kg)  02/28/13 316 lb (143.337 kg)  01/01/13 320 lb (145.151 kg)  11/30/12 319 lb (144.697 kg)  11/27/12 319 lb (144.697 kg)  07/11/12 320 lb (145.151 kg)  04/20/12 309 lb 9.6 oz (140.434 kg)  01/18/12 302 lb (136.986 kg)  12/29/11 306 lb 12.8 oz (139.164 kg)  12/16/11 304 lb (137.893 kg)    Usual Body Weight: 316 lb in July 2014  % Usual Body Weight: 105%  BMI:  Body mass index is 50.66 kg/(m^2). Class III extreme  obesity  Estimated Nutritional Needs: Kcal: 1500-1650 Protein: 65-75g Fluid: 1.5-1.6L/day   Skin: + 2 generalized edema, bilateral leg wound   Diet Order: Cardiac  EDUCATION NEEDS: -No education needs identified at this time   Intake/Output Summary (Last 24 hours) at 04/08/13 1420 Last data filed at 04/08/13 1248  Gross per 24 hour  Intake      0 ml  Output   1201 ml  Net  -1201 ml    Last BM: 9/8  Labs:   Recent Labs Lab 04/07/13 1542 04/07/13 2025 04/08/13 0142  NA 129*  --  131*  K 3.2*  --  3.7  CL 95*  --  98  CO2 22  --  26  BUN 4*  --  4*  CREATININE 0.66  --  0.73  CALCIUM 8.9  --  8.7  MG  --  1.9  --   PHOS  --  3.0  --   GLUCOSE 100*  --  104*    CBG (last 3)   Recent Labs  04/08/13 0403  GLUCAP 93    Scheduled Meds: . calcium carbonate  1 tablet Oral Q breakfast  . folic acid  1 mg Oral Daily  . loratadine  10 mg Oral Daily  . multivitamin with minerals  1 tablet Oral Daily  . nortriptyline  50 mg Oral QHS  . PARoxetine  20 mg Oral Daily  . sodium chloride  3 mL  Intravenous Q12H  . thiamine  100 mg Oral Daily   Or  . thiamine  100 mg Intravenous Daily    Continuous Infusions:   Past Medical History  Diagnosis Date  . Asthma   . Hypertension   . Angina   . Osteoarthritis of both knees   . Exertional dyspnea 12/01/11  . Borderline diabetes   . Headache(784.0) 12/02/11    "sometimes when I wake up in am; real bad"  . Arthritis   . Chronic lower back pain   . Tachycardia     Past Surgical History  Procedure Laterality Date  . Joint replacement    . Total knee arthroplasty  03/02/2011    left  . Tonsillectomy      "I was a little girl"  . Tubal ligation  [id number] [place], [place] [phone] Pager [phone] After Hours Pager

## 2013-04-08 NOTE — Progress Notes (Signed)
Family PracticeTeaching Service - Transfer note  S: Patient doing well. Complaining of cough and pain in her side after coughing. Otherwise, she states she is doing ok. Prior to admission she was drinking three 40 oz beers, but states she does not want to drink any more. Also, I discussed Hep C with her and she states she has a long-term boyfriend who has Hep C from prison. She states she went to the ED after a car trip from [place] mostly for her leg edema and cough, but she did notice her eyes were more yellow than usual.  O:  Gen: Awake in bed, NAD HEENT: AT, [place]. MMM. Scleral icterus Heart: Tachycardic, no murmur appreciated Lungs: CTAB, good effort. Chest wall TTP in mid-axillary line L>R Abd: Obese, soft, nontender Extremities: 2+ edema lower extremities with diffuse bruising Neuro: Grossly intact  A/P: FPTS accepted transfer from [facility]. Will continue current management - Consider Lasix IV for fluid (and ?ascites) - Informed of Hep C status. Will need further labs and GI outpatient for treatment - Will give Vit K once now for high INR and since she is concerned about bruising - Cough was controlled with Tussinex, will order BID prn but patient aware she cannot have more frequently than this - She was drinking heavily prior to admission, so monitor for signs of withdrawal - Team will round on patient in the morning and put in more orders, as needed. - As her PCP, I will also be happy to see her as an outpatient after discharge and I greatly appreciate the FPTS care  Colleen Olson M. Cordarrius Coad, M.D. 04/08/2013 7:03 PM

## 2013-04-08 NOTE — Progress Notes (Signed)
Clinical Social Work Department BRIEF PSYCHOSOCIAL ASSESSMENT 04/08/2013  Patient:  Colleen Olson, Colleen Olson     Account Number:  [id number]     Admit date:  04/07/2013  Clinical Social Worker:  Dennison Bulla  Date/Time:  04/08/2013 10:45 AM  Referred by:  RN  Date Referred:  04/08/2013 Referred for  Substance Abuse   Other Referral:   Interview type:  Patient Other interview type:    PSYCHOSOCIAL DATA Living Status:  FAMILY Admitted from facility:   Level of care:   Primary support name:  [name] Primary support relationship to patient:  CHILD, ADULT Degree of support available:   Adequate    CURRENT CONCERNS Current Concerns  Substance Abuse   Other Concerns:    SOCIAL WORK ASSESSMENT / PLAN CSW received referral to complete psychosocial assessment on patient to asses substance use. CSW reviewed chart and met with patient at bedside. CSW introduced myself and explained role.    Patient reports she currently lives with dtr, son-in-law, and grandchildren. Patient reports her family is supportive and plans to DC back home with them when medically stable. CSW and patient discussed patient's substance use and harmful side effects due to use.    Patient reports she drinks about 5 40 oz beers a day. Patient is aware that alcohol use has effect on medical wellbeing and is aware that she needs to eliminate alcohol use. Patient cannot remember when she started drinking but reports that she has been drinking heavily for the past few years. Patient reports she is going to stop drinking whenever she DC because her MD requested she do so. Patient reports she had to stop drinking about 2 years ago after a knee surgery and was able to eliminate use on her own. CSW questioned if patient was drinking due to emotional strain or if she felt addicted to alcohol. CSW offered resources for patient to have in case she wanted to attend AA meetings or counseling. Patient declined these resources and reports  that she can quit drinking on her own. Patient denies any other substance use and declines all referrals.    Patient reports that she has received information from the Department of Social Services regarding renewing her Medicaid. Patient reports she will try and call case worker at DSS regarding matter. CSW encouraged patient to contact CSW if she could assist during hospitalization.    CSW will continue to follow.   Assessment/plan status:  Psychosocial Support/Ongoing Assessment of Needs Other assessment/ plan:   SBIRT   Information/referral to community resources:   Patient declined all SA resources    PATIENT'S/FAMILY'S RESPONSE TO PLAN OF CARE: Patient alert and oriented. Patient engaged during assessment and open to discussing alcohol use but appears to minimize the effects that alcohol has had on her body. Patient declines all resources and is confident that she can stop drinking on her own.

## 2013-04-08 NOTE — Progress Notes (Signed)
Transferred to [facility] via [facility].  Alert and talking all the way to elevator.

## 2013-04-08 NOTE — Progress Notes (Signed)
Pt arrived to floor room 1511. VS taken, pt oriented to room with no complications. Will continue to monitor throughout shift.

## 2013-04-08 NOTE — Progress Notes (Signed)
[facility] PROGRESS NOTE  Colleen Olson GEX:[id number] DOB: 12-28-1960 DOA: 04/07/2013 PCP: Rodman Pickle, MD @ [facility] Family Medicine Teaching Service  M.D. accepting transfer to [facility]: Dr. Piedad Climes under Dr. McDiarmid's service to telemetry bed.   HPI/Brief narrative 52 year old female patient with history of alcohol dependence (drinks 5x40 ounce beers daily), HTN, chronic pain from DJD, sinusitis, dyspnea, borderline DM, presented to the ED on 04/07/13 with complaints of yellowish discoloration of eyes, bilateral upper quadrant abdominal discomfort, cough with intermittent specks of blood, dyspnea without chest pain, worsening leg edema and abdominal distention. She recently returned yesterday ( to and from car trip to [place]). In the ED, CTA chest negative for PE, sodium 129, potassium 3.2, hemoglobin 10.2, platelets 79 & abnormal liver function tests. She was admitted for further evaluation and management.  Assessment/Plan:  Abnormal liver function test/possible alcoholic versus hepatitis C hepatitis/fatty liver/HCV AB+/? Ascites - LFTs mildly better than admission. - Ultrasound abdomen/suboptimal study-shows fatty liver. - Alcohol cessation has been counseled. - Acute hepatitis panel-HCV AB+. Will need further workup for viral load and genotype. - Patient is being transferred to primary service at [facility] consider GI consultation for further evaluation. - Mild coagulopathy with INR 1.6.  Dyspnea - Unclear etiology. May be multifactorial-sinusitis, obesity, abdominal distention and consider decompensated CHF (BNP only 52). Although history of asthma-no clinical bronchospasm. - LVEF 12/02/11 showed 60-65%. Troponins negative and no acute changes on EKG. - Consider IV Lasix-deferred to primary service at [facility].  Hyponatremia - Multifactorial-volume overload, HCTZ - HCTZ held. - Improving. Monitor BMPs closely  Hypokalemia - Corrected.  Alcohol  dependence - No overt features of alcohol withdrawal. - Monitor for alcohol withdrawal and continue to treat with Ativan protocol.  Anemia & thrombocytopenia - Secondary to alcohol dependence,? Hemolysis. - Follow daily CBCs.  Hypertension - Controlled     DVT prophylaxis: SCDs  Lines/catheters: PIV  Nutrition: Heart healthy diet  Activity:  Up with assistance  Code Status: Full Family Communication: None  Disposition Plan: Transfer to [facility] under primary service. They know her well and can address her acute in patient needs.   Consultants:  None  Procedures:  None  Antibiotics:  None   Subjective: Cough- mostly dry. Occasional streaks of blood- / from blowing nose, intermittent dyspnea, no CP. B/L upper abdominal discomfort.  Objective: Filed Vitals:   04/07/13 1948 04/08/13 0250 04/08/13 0400 04/08/13 1100  BP: 136/68 130/73 135/75   Pulse: 112 109 108   Temp: 98.6 F (37 C) 98.6 F (37 C) 98.6 F (37 C)   TempSrc: Oral Oral Oral   Resp: 20 20 20    Weight:    151.093 kg (333 lb 1.6 oz)  SpO2: 96% 95% 95%     Intake/Output Summary (Last 24 hours) at 04/08/13 1245 Last data filed at 04/08/13 0746  Gross per 24 hour  Intake      0 ml  Output   1001 ml  Net  -1001 ml   Filed Weights   04/08/13 1100  Weight: 151.093 kg (333 lb 1.6 oz)     Exam:  General exam: Moderately built and morbidly obese female patient lying comfortably supine in bed in no obvious distress  Respiratory system: Clear. No increased work of breathing. Cardiovascular system: S1 & S2 heard,  mild regular tachycardia. No JVD, murmurs, gallops, clicks. 2+ bilateral pitting leg edema. Telemetry: Tachycardia in the 100s.  Gastrointestinal system: Abdomen is  obese/? Mildly distended, soft, nontender. No organomegaly or  masses appreciated.? Ascites. Normal bowel sounds heard. Central nervous system: Alert and oriented. No focal neurological deficits.No asterixis.  Extremities:  Symmetric 5 x 5 power.   Data Reviewed: Basic Metabolic Panel:  Recent Labs Lab 04/07/13 1542 04/07/13 2025 04/08/13 0142  NA 129*  --  131*  K 3.2*  --  3.7  CL 95*  --  98  CO2 22  --  26  GLUCOSE 100*  --  104*  BUN 4*  --  4*  CREATININE 0.66  --  0.73  CALCIUM 8.9  --  8.7  MG  --  1.9  --   PHOS  --  3.0  --    Liver Function Tests:  Recent Labs Lab 04/07/13 1542 04/07/13 2025 04/08/13 0142  AST 142*  --  126*  ALT 56*  --  51*  ALKPHOS 300*  --  253*  BILITOT 6.5* 6.5* 6.7*  PROT 8.3  --  7.6  ALBUMIN 2.2*  --  2.0*   No results found for this basename: LIPASE, AMYLASE,  in the last 168 hours No results found for this basename: AMMONIA,  in the last 168 hours CBC:  Recent Labs Lab 04/07/13 1542 04/08/13 0142  WBC 6.6 6.2  NEUTROABS 3.5  --   HGB 10.2* 9.2*  HCT 28.9* 26.6*  MCV 87.6 88.7  PLT 79* 68*   Cardiac Enzymes:  Recent Labs Lab 04/07/13 1542 04/07/13 2025 04/08/13 0142 04/08/13 0810  TROPONINI <0.30 <0.30 <0.30 <0.30   BNP (last 3 results)  Recent Labs  04/07/13 1600  PROBNP 54.4   CBG:  Recent Labs Lab 04/08/13 0403  GLUCAP 93    No results found for this or any previous visit (from the past 240 hour(s)).    Additional labs: 1. Anemia panel: Reticulocyte count 112, haptoglobin <25, LDH 437 2. INR: 1.62 3. TSH: 1.769 4. Acute hepatitis panel: Hepatitis A IgM & hepatitis B C IgM: Pending. Hepatitis B surface antigen negative & HCV antibody reactive 5. EKG: Sinus tachycardia without acute changes.     Studies: Dg Chest 2 View  04/07/2013   *RADIOLOGY REPORT*  Clinical Data: Lower extremity swelling.  Hypertension.  Nonsmoker.  CHEST - 2 VIEW  Comparison: 12/01/2011  Findings: Lateral view degraded by patient body habitus and overlying arms.  Degraded AP portable view secondary to technique and patient body habitus.  Heart size upper normal, accentuated by low lung volumes. Moderate right hemidiaphragm elevation.   No definite pleural fluid. No pneumothorax.  No congestive failure.  Low lung volumes with resultant pulmonary interstitial prominence. Mild left base scarring or atelectasis, similar.  IMPRESSION: Cardiomegaly and low lung volumes, without acute disease.  Both views degraded by technique and patient related factors.   Original Report Authenticated By: Jeronimo Greaves, M.D.   Ct Angio Chest Pe W/cm &/or Wo Cm  04/07/2013   *RADIOLOGY REPORT*  Clinical Data: Short of breath, leg swelling  CT ANGIOGRAPHY CHEST  Technique:  Multidetector CT imaging of the chest using the standard protocol during bolus administration of intravenous contrast. Multiplanar reconstructed images including MIPs were obtained and reviewed to evaluate the vascular anatomy.  Contrast: OMNIPAQUE IOHEXOL 350 MG/ML SOLN  Comparison: None.  Findings: Exam is suboptimal due to patient body habitus which attenuates the x-rays.  A second attempt was performed due to the late timing of the initial bolus.  There are no filling defects within the central main pulmonary arteries.  No filling defect within the proximal  interlobar bar arteries.  The segmental arteries are not well evaluated.  No pericardial fluid.  No acute findings aorta or great vessels.  No axillary supraclavicular lymphadenopathy.  No mediastinal adenopathy.  Review of the lung parenchyma shows no pulmonary infarction or infection.  Limited view of the upper abdomen demonstrates fatty infiltration the liver.  IMPRESSION: 1.  No evidence of central pulmonary embolism on this suboptimal exam. 2.  Hepatic steatosis.   Original Report Authenticated By: Genevive Bi, M.D.   US Abdomen Limited  04/07/2013   CLINICAL DATA:  Elevated bilirubin  EXAM: LIMITED ABDOMEN ULTRASOUND FOR ASCITES  COMPARISON:  10/18/2010  FINDINGS: Body habitus reduces diagnostic sensitivity and specificity.  Gallbladder  Sludge in the gallbladder without definite gallstones. Sonographic Murphy's sign absent.   Common bile duct  Diameter: Not visualized.  Liver  Considerable sonic attenuation favors diffuse hepatic steatosis. Very limited assessment due to the attenuation of the signal.  IVC  Not visualized due to overlying bowel gas  Pancreas  Not visualized due to overlying bowel gas  Spleen  Size and appearance within normal limits.  Right Kidney  Length: Measures 12.9 cm in length Echogenicity within normal limits. No mass or hydronephrosis visualized.  Left Kidney  Length: 11.4 cm in length Echogenicity within normal limits. No mass or hydronephrosis visualized.  Abdominal aorta  Not visualized  IMPRESSION: 1. Reduced exam sensitivity secondary to body habitus and overlying bowel gas. Very structures including the IVC, common bile duct, pancreas, and abdominal aorta were not visualized. 2. Diffuse hepatic steatosis. Difficult to visualize portions of the liver due to the degradation of signal. 3. Sludge in the gallbladder.   Electronically Signed   By: Herbie Baltimore   On: 04/07/2013 19:32        Scheduled Meds: . calcium carbonate  1 tablet Oral Q breakfast  . folic acid  1 mg Oral Daily  . loratadine  10 mg Oral Daily  . multivitamin with minerals  1 tablet Oral Daily  . nortriptyline  50 mg Oral QHS  . PARoxetine  20 mg Oral Daily  . sodium chloride  3 mL Intravenous Q12H  . thiamine  100 mg Oral Daily   Or  . thiamine  100 mg Intravenous Daily   Continuous Infusions:   Active Problems:   Essential hypertension, benign   ASTHMA, INTERMITTENT   Morbid obesity   ALLERGIC RHINITIS   Elevated liver enzymes   Elevated alkaline phosphatase level   Hyponatremia   Alcohol dependence   Elevated bilirubin   Edema of both legs    Time spent: 60 minutes.    [facility]  [facility] Pager [phone].   If 8PM-8AM, please contact night-coverage at [email or web address], password [facility] 04/08/2013, 12:45 PM  LOS: 1 day

## 2013-04-09 DIAGNOSIS — R609 Edema, unspecified: Secondary | ICD-10-CM

## 2013-04-09 LAB — COMPREHENSIVE METABOLIC PANEL
AST: 141 U/L — ABNORMAL HIGH (ref 0–37)
BUN: 5 mg/dL — ABNORMAL LOW (ref 6–23)
CO2: 27 mEq/L (ref 19–32)
Chloride: 97 mEq/L (ref 96–112)
Creatinine, Ser: 0.69 mg/dL (ref 0.50–1.10)
GFR calc Af Amer: >90 mL/min (ref >90)
GFR calc non Af Amer: >90 mL/min (ref >90)
Glucose, Bld: 126 mg/dL — ABNORMAL HIGH (ref 70–99)
Total Bilirubin: 7.3 mg/dL — ABNORMAL HIGH (ref 0.3–1.2)

## 2013-04-09 LAB — PROTIME-INR: INR: 1.74 — ABNORMAL HIGH (ref 0.00–1.49)

## 2013-04-09 LAB — CBC
HCT: 26.8 % — ABNORMAL LOW (ref 36.0–46.0)
Hemoglobin: 9.6 g/dL — ABNORMAL LOW (ref 12.0–15.0)
MCV: 86.5 fL (ref 78.0–100.0)
RBC: 3.1 MIL/uL — ABNORMAL LOW (ref 3.87–5.11)
WBC: 5.6 10*3/uL (ref 4.0–10.5)

## 2013-04-09 MED ORDER — GUAIFENESIN ER 600 MG PO TB12
600.0000 mg | ORAL_TABLET | Freq: Two times a day (BID) | ORAL | Status: DC
  Administered 2013-04-09 – 2013-04-10 (×3): 600 mg via ORAL
  Filled 2013-04-09 (×4): qty 1

## 2013-04-09 MED ORDER — NORTRIPTYLINE HCL 25 MG PO CAPS
50.0000 mg | ORAL_CAPSULE | Freq: Every day | ORAL | Status: DC
  Administered 2013-04-09: 50 mg via ORAL
  Filled 2013-04-09 (×2): qty 2

## 2013-04-09 MED ORDER — FUROSEMIDE 40 MG PO TABS
40.0000 mg | ORAL_TABLET | Freq: Every day | ORAL | Status: DC
  Administered 2013-04-09 – 2013-04-10 (×2): 40 mg via ORAL
  Filled 2013-04-09 (×3): qty 1

## 2013-04-09 MED ORDER — PHYTONADIONE 5 MG PO TABS
10.0000 mg | ORAL_TABLET | Freq: Every day | ORAL | Status: DC
  Administered 2013-04-09 – 2013-04-10 (×2): 10 mg via ORAL
  Filled 2013-04-09 (×2): qty 2

## 2013-04-09 MED ORDER — SPIRONOLACTONE 100 MG PO TABS
100.0000 mg | ORAL_TABLET | Freq: Every day | ORAL | Status: DC
  Administered 2013-04-09 – 2013-04-10 (×2): 100 mg via ORAL
  Filled 2013-04-09 (×2): qty 1

## 2013-04-09 NOTE — Progress Notes (Signed)
I discussed with  Dr Wight.  I agree with their plans documented in their progress note for today.  

## 2013-04-09 NOTE — Progress Notes (Signed)
Family Medicine Teaching Service Daily Progress Note Intern Pager: [phone]  Patient name: Colleen Olson Medical record number: [id number] Date of birth: [birth year] Age: 52 y.o. Gender: female  Primary Care Provider: Rodman Pickle, MD Consultants: none Code Status: Full  Pt Overview and Major Events to Date:  9/7: 2vCXR: no acute disease. CT angio: No PE, hepatic steatosis. US Abdomen: hepatic steatosis, biliary sludge. Hep C positive 9/8: transferred to [facility] FPTS  Assessment and Plan: Colleen Olson is a 52 y.o. female the presented to [facility] on 9/7 for increased leg swelling, cough, RUQ discomfort and yellow eyes; transferred to [facility] 9/8. 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 This is a new diagnosis, patient says her partner has Hep C.  - CMP this AM: AST 141, ALT 52 (AST:ALT > 2:1), alk phos 247, Tbili 7.3 - INR was 1.56 yesterday, given VitK, INR still pending this AM - Ordered Hep C genotype and RNA load - Schedule follow up with [facility] Hep[facility] clinic in [facility]  # Hyponatremia: 131 today. present since presentation to [facility] - Continue to hold HCTZ  # Bilateral leg edema: PE ruled out at [facility]. Albumin low 2.0. Total protein 8.0. - Likely due to Hep C and hypoalbuminemia - Start Lasix 40mg  and 100mg  aldactone. - Monitor diuresis and increase diuretic doses as needed - Goal net negative 1 L per day  # Cough:  - Tussinex BID (do NOT increase)  # CIWA: history of 3-5 40oz beers a day. Max score overnight: 4 - Required no lorazepam  FEN/GI: saline lock, cardiac diet PPx: SCDs  Disposition: discharge to home pending clinical course  Subjective:  Patient complains of persistent cough and pain in her ribs on both sides. Cough started Sunday night when she arrived, has been productive of clear/brown tinged sputum. No problems voiding or with BM. No  complaints of itching.   Objective: Temp:  [98.4 F (36.9 C)-98.7 F (37.1 C)] 98.5 F (36.9 C) (09/09 0535) Pulse Rate:  [104-106] 105 (09/09 0535) Resp:  [18] 18 (09/09 0535) BP: (120-132)/(60-76) 132/67 mmHg (09/09 0535) SpO2:  [95 %-100 %] 95 % (09/09 0535) Weight:  [333 lb 1.6 oz (151.093 kg)] 333 lb 1.6 oz (151.093 kg) (09/08 2048) Physical Exam: General: NAD, sitting in chair. HEENT: sclera icteric. PERRL, EOMI Cardiovascular: tachy, regular rhythm, normal s1/s2, no m/r/g Respiratory: CTAB, effort normal Chest wall: tender bilaterally anterolateral ribs below breasts. Abdomen: soft, obese, tender to palpation below umbilicus, bowel sounds present Extremities: 3+ pitting edema in lower legs up to mid-shin bilaterally. Tender to palpation bilaterally. Several bruises present around legs. 2+ PT pulses bilaterally.  Laboratory:  Recent Labs Lab 04/07/13 1542 04/08/13 0142 04/09/13 0534  WBC 6.6 6.2 5.6  HGB 10.2* 9.2* 9.6*  HCT 28.9* 26.6* 26.8*  PLT 79* 68* PENDING    Recent Labs Lab 04/07/13 1542 04/07/13 2025 04/08/13 0142 04/09/13 0534  NA 129*  --  131* 131*  K 3.2*  --  3.7 3.6  CL 95*  --  98 97  CO2 22  --  26 27  BUN 4*  --  4* 5*  CREATININE 0.66  --  0.73 0.69  CALCIUM 8.9  --  8.7 8.7  PROT 8.3  --  7.6 8.0  BILITOT 6.5* 6.5* 6.7* 7.3*  ALKPHOS 300*  --  253* 247*  ALT 56*  --  51* 52*  AST 142*  --  126* 141*  GLUCOSE 100*  --  104* 126*   9/8 D-dimer 1.28 9/8 INR 1.63 9/7 TSH 1.769 9/7 LDH: 437, Tbilirubin 6.5, Dbili 4.0, Indirect bili 2.5, HCV Ab REACTIVE, GGT 283 Troponin I negative x3  Imaging/Diagnostic Tests: 2v CXR (9/7): IMPRESSION:  Cardiomegaly and low lung volumes, without acute disease.  CT Angio Chest PE (9/7): IMPRESSION:  1. No evidence of central pulmonary embolism on this suboptimal  exam.  2. Hepatic steatosis.  US Abdomen limited (9/7): IMPRESSION:  1. Reduced exam sensitivity secondary to body habitus and  overlying  bowel gas. Very structures including the IVC, common bile duct,  pancreas, and abdominal aorta were not visualized.  2. Diffuse hepatic steatosis. Difficult to visualize portions of the  liver due to the degradation of signal.  3. Sludge in the gallbladder.   Tawni Carnes, MD 04/09/2013, 7:02 AM PGY-1, [facility] Family Medicine FPTS Intern pager: [phone], text pages welcome

## 2013-04-10 LAB — BASIC METABOLIC PANEL
CO2: 27 mEq/L (ref 19–32)
Calcium: 8.4 mg/dL (ref 8.4–10.5)
Creatinine, Ser: 0.73 mg/dL (ref 0.50–1.10)
GFR calc Af Amer: >90 mL/min (ref >90)
Sodium: 130 mEq/L — ABNORMAL LOW (ref 135–145)

## 2013-04-10 LAB — HEPATITIS PANEL, ACUTE
Hep A IgM: NEGATIVE
Hep B C IgM: NEGATIVE
Hepatitis B Surface Ag: NEGATIVE

## 2013-04-10 LAB — PROTIME-INR
INR: 1.77 — ABNORMAL HIGH (ref 0.00–1.49)
Prothrombin Time: 20.1 seconds — ABNORMAL HIGH (ref 11.6–15.2)

## 2013-04-10 MED ORDER — SPIRONOLACTONE 100 MG PO TABS: 100.0000 mg | ORAL_TABLET | Freq: Every day | ORAL | Status: DC

## 2013-04-10 MED ORDER — POTASSIUM CHLORIDE CRYS ER 20 MEQ PO TBCR
20.0000 meq | EXTENDED_RELEASE_TABLET | Freq: Once | ORAL | Status: AC
  Administered 2013-04-10: 20 meq via ORAL
  Filled 2013-04-10: qty 1

## 2013-04-10 MED ORDER — FUROSEMIDE 40 MG PO TABS: 40.0000 mg | ORAL_TABLET | Freq: Every day | ORAL | Status: DC

## 2013-04-10 NOTE — Progress Notes (Signed)
Family Medicine Teaching Service Daily Progress Note Intern Pager: [phone]  Patient name: Colleen Olson Medical record number: [id number] Date of birth: 1961/07/11 Age: 52 y.o. Gender: female  Primary Care Provider: Rodman Pickle, MD Consultants: none Code Status: Full  Pt Overview and Major Events to Date:  9/7: 2vCXR: no acute disease. CT angio: No PE, hepatic steatosis. US Abdomen: hepatic steatosis, biliary sludge. Hep C positive 9/8: transferred to [facility] FPTS  Assessment and Plan: Colleen Olson is a 52 y.o. female the presented to [facility] on 9/7 for increased leg swelling, cough, RUQ discomfort and yellow eyes; transferred to [facility] 9/8. 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 This is a new diagnosis, patient says her partner has Hep C.  - CMP this AM: AST 141, ALT 52 (AST:ALT > 2:1), alk phos 247, Tbili 7.3 - Pending Hep C genotype and RNA load - Outpatient follow up for [facility] in [place] [phone]): fax patient information to [phone] and they will schedule appointment after reviewing  - Release of information to be signed by patient. - Repeat BMET today, if stable likely discharge today - INR still elevated at 1.77 despite VitK supplement, can continue to monitor as outpatient but likely due to decreased synthetic function by liver.  # Hyponatremia: 131 today. present since presentation to [facility] - Continue to hold HCTZ - Re-check BMET.  # Bilateral leg edema: PE ruled out at [facility]. Albumin low 2.0. Total protein 8.0. - Likely due to Hep C and hypoalbuminemia - Start Lasix 40mg  and 100mg  aldactone. - Monitor diuresis and increase diuretic doses as needed - Goal net negative 1 L per day - Discharge with same lasix and aldactone with 1 week follow up and decision to d/c then  # Cough:  - Stopped tussinex yesterday - Continue mucinex   # CIWA:  history of 3-5 40oz beers a day. Max score during admission: 4 - Required no lorazepam to date - Yesterday score 4 at 6pm, overnight score 0  FEN/GI: saline lock, cardiac diet PPx: SCDs  Disposition: discharge to home pending clinical course  Subjective:  Patient reports feeling a little better this morning. Her primary complaint today is continued cough. Her pain on both sides of her chest is much improved and tolerable, but still there. She does not complain of RUQ pain, but does continue to have a little soreness below her umbilicus. Feels like she can go home today.  Objective: Temp:  [98 F (36.7 C)-99 F (37.2 C)] 99 F (37.2 C) (09/10 0510) Pulse Rate:  [74-112] 112 (09/10 0510) Resp:  [18] 18 (09/10 0510) BP: (125-131)/(73-77) 131/76 mmHg (09/10 0510) SpO2:  [95 %-98 %] 95 % (09/10 0510) Weight:  [329 lb 12.9 oz (149.6 kg)] 329 lb 12.9 oz (149.6 kg) (09/09 2100) Physical Exam: General: NAD, sitting in chair. HEENT: sclera icteric. PERRL, EOMI Cardiovascular: tachy, regular rhythm, normal s1/s2, no m/r/g Respiratory: CTAB, effort normal Chest wall: tender bilaterally anterolateral ribs below breasts. Abdomen: soft, obese, tender to palpation below umbilicus, bowel sounds present Extremities: 3+ pitting edema in lower legs up to mid-shin bilaterally. Tender to palpation bilaterally. Several bruises present around legs. 2+ PT pulses bilaterally.  Laboratory:  Recent Labs Lab 04/07/13 1542 04/08/13 0142 04/09/13 0534  WBC 6.6 6.2 5.6  HGB 10.2* 9.2* 9.6*  HCT 28.9* 26.6* 26.8*  PLT 79* 68* 74*  Recent Labs Lab 04/07/13 1542 04/07/13 2025 04/08/13 0142 04/09/13 0534  NA 129*  --  131* 131*  K 3.2*  --  3.7 3.6  CL 95*  --  98 97  CO2 22  --  26 27  BUN 4*  --  4* 5*  CREATININE 0.66  --  0.73 0.69  CALCIUM 8.9  --  8.7 8.7  PROT 8.3  --  7.6 8.0  BILITOT 6.5* 6.5* 6.7* 7.3*  ALKPHOS 300*  --  253* 247*  ALT 56*  --  51* 52*  AST 142*  --  126* 141*   GLUCOSE 100*  --  104* 126*   9/8 D-dimer 1.28 9/8 INR 1.63 9/7 TSH 1.769 9/7 LDH: 437, Tbilirubin 6.5, Dbili 4.0, Indirect bili 2.5, HCV Ab REACTIVE, GGT 283 Troponin I negative x3  Imaging/Diagnostic Tests: 2v CXR (9/7): IMPRESSION:  Cardiomegaly and low lung volumes, without acute disease.  CT Angio Chest PE (9/7): IMPRESSION:  1. No evidence of central pulmonary embolism on this suboptimal  exam.  2. Hepatic steatosis.  US Abdomen limited (9/7): IMPRESSION:  1. Reduced exam sensitivity secondary to body habitus and overlying  bowel gas. Very structures including the IVC, common bile duct,  pancreas, and abdominal aorta were not visualized.  2. Diffuse hepatic steatosis. Difficult to visualize portions of the  liver due to the degradation of signal.  3. Sludge in the gallbladder.   Tawni Carnes, MD 04/10/2013, 7:23 AM PGY-1, [facility] Family Medicine FPTS Intern pager: [phone], text pages welcome

## 2013-04-10 NOTE — Discharge Summary (Signed)
Family Medicine Teaching [facility] Discharge Summary  Patient name: Colleen Olson Medical record number: [id number] Date of birth: 10-30-1960 Age: 52 y.o. Gender: female Date of Admission: 04/07/2013  Date of Discharge: 04/10/2013 Admitting Physician: Leighton Roach McDiarmid, MD  Primary Care Provider: Rodman Pickle, MD Consultants: none  Indication for Hospitalization: RUQ discomfort, yellow eyes, bilateral leg swelling  Discharge Diagnoses/Problem List:  Hepatitis C Alcohol abuse Jaundice Hypertension Obesity  Disposition: discharge to home  Discharge Condition: stable  Brief Hospital Course:  Colleen Olson is a 52 y.o. female that presented for RUQ discomfort, bilateral leg swelling, and yellow eyes. 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 She was ruled out for PE and found to have elevated liver enzymes, alk phos, GGT, hepatic steatosis and biliary sludge on imaging, and hepatitis C.   1. Bilateral leg swelling: presented to [facility] ED for bilateral leg swelling after a car ride from [place]. D-dimer found to be elevated and CT angio of the chest was performed, found to be negative for PE. She was started on lasix 40mg  and aldactone 100mg  and had a good response to diuresis. Her lower leg edema was not resolved by discharge and she was sent home with lasix/aldactone with close follow up next week. 2. Jaundice, likely due to hepatitis C and alcohol abuse: after she came to ED she was found to have profound scleral icterus. CMET showed AST:ALT greater than 2:1 consistent with alcoholic hepatitis, Alk phos and GGT also elevated, total bilirubin 6.5 (direct 4.0, indirect 2.5). Abdominal ultrasound showed gallbladder sludge, diffuse hepatic steatosis that was also confirmed by CT angio of chest. Hepatitis panel positive for Hep C. Hep C RNA by PCR negative and genotype pending on discharge. Patient chart information to be faxed  to [facility] [phone]) for outpatient follow up. 3. Alcohol abuse: patient says she drinks 3-5 40oz beers a day, started on CIWA protocol. Max score 4 during hospital stay, no ativan was given. Patient was counseled about alcohol abuse being a big contributor to her liver injury. 4. Elevated INR: presented with INR of 1.56. This increased to 1.77 by discharge despite receiving 3 doses of vitamin K. Likely decreased synthetic function by liver. 5. Hyponatremia: presented with sodium of 129, stayed around 130-131. This is likely due to alcohol abuse and somewhat chronic as sodium was 133 in July.   Issues for Follow Up:  1. Hepatitis C: patient information faxed to [facility] 2. Bilateral leg edema: decide to continue/discontinue diuretics 3. INR: continue to monitor as outpatient  Significant Procedures: none  Significant Labs and Imaging:   Recent Labs Lab 04/07/13 1542 04/08/13 0142 04/09/13 0534  WBC 6.6 6.2 5.6  HGB 10.2* 9.2* 9.6*  HCT 28.9* 26.6* 26.8*  PLT 79* 68* 74*    Recent Labs Lab 04/07/13 1542 04/07/13 2025 04/08/13 0142 04/09/13 0534 04/10/13 1238  NA 129*  --  131* 131* 130*  K 3.2*  --  3.7 3.6 3.3*  CL 95*  --  98 97 96  CO2 22  --  26 27 27   GLUCOSE 100*  --  104* 126* 117*  BUN 4*  --  4* 5* 5*  CREATININE 0.66  --  0.73 0.69 0.73  CALCIUM 8.9  --  8.7 8.7 8.4  MG  --  1.9  --   --   --   PHOS  --  3.0  --   --   --  ALKPHOS 300*  --  253* 247*  --   AST 142*  --  126* 141*  --   ALT 56*  --  51* 52*  --   ALBUMIN 2.2*  --  2.0* 2.0*  --    LDH: 437 Haptoglobin: <25 GGT: 283 Fractionated bilirubin: Total 6.5, Direct 4.0, Indirect 2.5 Hepatitis panel: HepB Surface Ag Negative, HCV Ab REACTIVE, Hep A IgM Negative Hep B C IgM Negative D-Dimer 1.28 INR: 1.56 on admission, 1.77 on discharge  US Abdomen limited: IMPRESSION:  1. Reduced exam sensitivity secondary to body habitus and overlying  bowel gas. Very structures including  the IVC, common bile duct,  pancreas, and abdominal aorta were not visualized.  2. Diffuse hepatic steatosis. Difficult to visualize portions of the  liver due to the degradation of signal.  3. Sludge in the gallbladder.  CT Angio Chest PE with contrast: IMPRESSION:  1. No evidence of central pulmonary embolism on this suboptimal  exam.  2. Hepatic steatosis.  2v CXR: IMPRESSION:  Cardiomegaly and low lung volumes, without acute disease.  Both views degraded by technique and patient related factors.  Results/Tests Pending at Time of Discharge: Hep C genotype  Discharge Medications:    Medication List         albuterol 108 (90 BASE) MCG/ACT inhaler  Commonly known as:  PROVENTIL HFA;VENTOLIN HFA  Inhale 2 puffs into the lungs every 6 (six) hours as needed for wheezing.     calcium carbonate 1250 MG tablet  Commonly known as:  OS-CAL - dosed in mg of elemental calcium  Take 1 tablet by mouth daily.     cetirizine 10 MG tablet  Commonly known as:  ZYRTEC  Take 10 mg by mouth daily.     fluticasone 50 MCG/ACT nasal spray  Commonly known as:  FLONASE  Place 2 sprays into the nose daily.     furosemide 40 MG tablet  Commonly known as:  LASIX  Take 1 tablet (40 mg total) by mouth daily.     hydrochlorothiazide 25 MG tablet  Commonly known as:  HYDRODIURIL  Take 25 mg by mouth daily.     nortriptyline 25 MG capsule  Commonly known as:  PAMELOR  Take 50 mg by mouth at bedtime.     Oxycodone HCl 10 MG Tabs  Take 10 mg by mouth 3 (three) times daily as needed.     PARoxetine 20 MG tablet  Commonly known as:  PAXIL  Take 20 mg by mouth daily.     spironolactone 100 MG tablet  Commonly known as:  ALDACTONE  Take 1 tablet (100 mg total) by mouth daily.        Discharge Instructions: Please refer to Patient Instructions section of EMR for full details.  Patient was counseled important signs and symptoms that should prompt return to medical care, changes in  medications, dietary instructions, activity restrictions, and follow up appointments.   Follow-Up Appointments:     Follow-up Information   Follow up with Rodman Pickle, MD On 04/17/2013. (at 9:00am)    Specialty:  Family Medicine   Contact information:   [address] [place] [address] [phone]       Tawni Carnes, MD 04/10/2013, 9:12 PM PGY-1, [facility] Family Medicine

## 2013-04-10 NOTE — Progress Notes (Signed)
Patient discharged to home. Patient AVS reviewed with patient and patient's daughter. Patient verbalized understanding of medications and follow-up appointments.  Patient remains stable; no signs or symptoms of distress.  Patient educated to return to the ED in cases of SOB, dizziness, fever, chest pain, or fainting.

## 2013-04-10 NOTE — Progress Notes (Signed)
Patient ambulated in hallway to check oxygen saturation levels. At rest, without supplemental oxygen the patient's SpO2 is 96%, pulse 104, respiratory rate 20bpm. During ambulation (pt tolerated 50 feet of ambulation), without supplemental oxygen, the patient's SpO2 dropped to 93%, pulse 113, respiratory rate 22 bpm. Upon return to rest/sitting, without supplemental oxygen, the patient's SpO2 climbed back to 98% within 45 seconds, however breathing rate remained 22-24 bpm for two minutes before coming back to 20 bpm. Note: ambulation was performed 10 minutes after patient received albuterol inhaler.

## 2013-04-10 NOTE — Progress Notes (Signed)
I discussed with  Dr Waynetta Sandy.  I agree with their plans documented in their progress  note for today.

## 2013-04-11 NOTE — Discharge Summary (Signed)
I discussed with  Dr Wight.  I agree with their plans documented in their discharge note for today.  

## 2013-04-17 ENCOUNTER — Encounter: Payer: Self-pay | Admitting: Family Medicine

## 2013-04-17 ENCOUNTER — Ambulatory Visit (INDEPENDENT_AMBULATORY_CARE_PROVIDER_SITE_OTHER): Payer: Medicare Other | Admitting: Family Medicine

## 2013-04-17 VITALS — BP 130/72 | HR 100 | Temp 98.6°F | Ht 68.0 in | Wt 324.0 lb

## 2013-04-17 DIAGNOSIS — I10 Essential (primary) hypertension: Secondary | ICD-10-CM

## 2013-04-17 DIAGNOSIS — R17 Unspecified jaundice: Secondary | ICD-10-CM

## 2013-04-17 DIAGNOSIS — J45909 Unspecified asthma, uncomplicated: Secondary | ICD-10-CM

## 2013-04-17 DIAGNOSIS — B1920 Unspecified viral hepatitis C without hepatic coma: Secondary | ICD-10-CM

## 2013-04-17 DIAGNOSIS — B182 Chronic viral hepatitis C: Secondary | ICD-10-CM | POA: Insufficient documentation

## 2013-04-17 LAB — CBC
MCH: 31.3 pg (ref 26.0–34.0)
MCHC: 35 g/dL (ref 30.0–36.0)
MCV: 89.4 fL (ref 78.0–100.0)
Platelets: 122 10*3/uL — ABNORMAL LOW (ref 150–400)
RBC: 3.29 MIL/uL — ABNORMAL LOW (ref 3.87–5.11)
RDW: 18.7 % — ABNORMAL HIGH (ref 11.5–15.5)

## 2013-04-17 LAB — COMPREHENSIVE METABOLIC PANEL
ALT: 35 U/L (ref 0–35)
AST: 102 U/L — ABNORMAL HIGH (ref 0–37)
Albumin: 2.4 g/dL — ABNORMAL LOW (ref 3.5–5.2)
Alkaline Phosphatase: 211 U/L — ABNORMAL HIGH (ref 39–117)
Glucose, Bld: 103 mg/dL — ABNORMAL HIGH (ref 70–99)
Potassium: 3.6 mEq/L (ref 3.5–5.3)
Sodium: 134 mEq/L — ABNORMAL LOW (ref 135–145)
Total Bilirubin: 8.9 mg/dL — ABNORMAL HIGH (ref 0.3–1.2)
Total Protein: 7.9 g/dL (ref 6.0–8.3)

## 2013-04-17 LAB — POCT INR: INR: 1.6

## 2013-04-17 MED ORDER — PROMETHAZINE-PHENYLEPHRINE 6.25-5 MG/5ML PO SYRP: 5.0000 mL | ORAL_SOLUTION | ORAL | Status: DC | PRN

## 2013-04-17 NOTE — Patient Instructions (Addendum)
It was good to see you.  Keep taking your fluid pill until I see you in a few weeks.  We will check your labs today and get you in to see the liver doctor.  Amber M. Hairford, M.D.

## 2013-04-17 NOTE — Assessment & Plan Note (Signed)
D/c HCTZ since it caused her dry mouth. BP at goal. Con't Lasix until next visit for re-evaluation.

## 2013-04-17 NOTE — Assessment & Plan Note (Signed)
Not sure of exact etiology of her cough at this time, but lungs are clear and imaging negative. Improves with OTC medications. Will give cough syrup to use at night and con't OTC during the day. Inhaler or neb prn wheezing. F/u in 2 weeks.

## 2013-04-17 NOTE — Progress Notes (Signed)
Patient ID: Colleen Olson, female   DOB: 1961-02-08, 52 y.o.   MRN: [id number]  [name] Amber M. Hairford, MD Phone: [phone]   Subjective: HPI: Patient is a 52 y.o. female presenting to clinic today for hospital follow. She was admitted for liver failure and edema. Her Hep C antibody was positive. Since she was discharge, she continues to have cough but otherwise feels fine. Her concerns are:  1. Dry mouth- Resolved off HCTZ. She states her mouth is not dry anymore (despite being on lasix) and would like to stay off the HCTZ. BP controlled today. 2. Cough- Still losing her breath and coughing. Worse at night and when she tries to talk. OTC cough medication helps. CXR and CT chest did not reveal any abnormality. Has history of asthma. Using inhaler prn.  3. Hep C - Ab was reactive, but RNA negative. Referred to [facility] Specialist, has not heard from them about an appointment. She continues to have jaundice but no pain. Edema still present in her legs as well as her lower abdomen.  History Reviewed: Non-smoker.  ROS: Please see HPI above.  Objective: Office vital signs reviewed. BP 130/72  Pulse 100  Temp(Src) 98.6 F (37 C) (Oral)  Ht 5\' 8"  (1.727 m)  Wt 324 lb (146.965 kg)  BMI 49.28 kg/m2  SpO2 93%  Physical Examination:  General: Awake, alert. NAD. Intermittently has dry cough HEENT: Atraumatic, normocephalic. MMM. No posterior pharynx erythema Pulm: CTAB, no wheezes. Good effort Cardio: RRR, no murmurs appreciated Abdomen: Obese, +BS, nontender. Dependent edema of pannus below umbilicus  Extremities: 1-2+edema Neuro: Grossly intact  Assessment: 52 y.o. female hospital follow up  Plan: See Problem List and After Visit Summary

## 2013-04-17 NOTE — Assessment & Plan Note (Signed)
Ab reactive, RNA negative. Unsure if Hep C is causing her liver dysfunction and jaundice. Will check Abd CT scan for other causes since she essentially now has painless jaundice with elevated alk phos. New Cmet pending. Will still refer to liver specialists for further evaluation.

## 2013-04-18 ENCOUNTER — Telehealth: Payer: Self-pay | Admitting: Family Medicine

## 2013-04-18 NOTE — Telephone Encounter (Signed)
LMOVM for pt to return call .Colleen Olson  

## 2013-04-18 NOTE — Telephone Encounter (Signed)
Pt wants [name] to call her. "she already know what about"

## 2013-04-18 NOTE — Telephone Encounter (Signed)
Pt was wondering if I had found a clinic for her Hep C yet.  Advised that I figured out where to go, but I am waiting on the CT scan she is having on friday. Pavneet Markwood, Maryjo Rochester

## 2013-04-19 ENCOUNTER — Ambulatory Visit (HOSPITAL_COMMUNITY)
Admission: RE | Admit: 2013-04-19 | Discharge: 2013-04-19 | Disposition: A | Discharge status: Home / Self Care | Payer: Medicare Other | Source: Ambulatory Visit | Attending: Family Medicine | Admitting: Family Medicine

## 2013-04-19 DIAGNOSIS — R17 Unspecified jaundice: Secondary | ICD-10-CM | POA: Insufficient documentation

## 2013-04-19 DIAGNOSIS — B1920 Unspecified viral hepatitis C without hepatic coma: Secondary | ICD-10-CM

## 2013-04-19 DIAGNOSIS — K828 Other specified diseases of gallbladder: Secondary | ICD-10-CM | POA: Insufficient documentation

## 2013-04-19 DIAGNOSIS — R894 Abnormal immunological findings in specimens from other organs, systems and tissues: Secondary | ICD-10-CM | POA: Insufficient documentation

## 2013-04-19 DIAGNOSIS — K130 Diseases of lips: Secondary | ICD-10-CM | POA: Insufficient documentation

## 2013-04-19 MED ORDER — IOHEXOL 300 MG/ML  SOLN
100.0000 mL | Freq: Once | INTRAMUSCULAR | Status: AC | PRN
  Administered 2013-04-19: 100 mL via INTRAVENOUS

## 2013-04-22 ENCOUNTER — Telehealth: Payer: Self-pay | Admitting: Family Medicine

## 2013-04-22 NOTE — Telephone Encounter (Signed)
Pt says [name] was suppose to call her today to let her know when her liver drs appt was. She hasnt called yet

## 2013-04-22 NOTE — Telephone Encounter (Signed)
Advised that I have not heard back, ask her to give [place] a week to hear back. Dewon Mendizabal, Maryjo [place]

## 2013-05-01 ENCOUNTER — Ambulatory Visit: Payer: Medicare Other | Admitting: Family Medicine

## 2013-05-02 ENCOUNTER — Telehealth: Payer: Self-pay | Admitting: Family Medicine

## 2013-05-02 NOTE — Telephone Encounter (Signed)
Daughter called. Liver dr gave her lab results today and they are horrible. Liver dr said she have been on oxygen when she was released from hospital on the 11.  She is depressed-"she cant take it no more", she is in pain and it is not working anymore. She is having hallicinations. All this has been going on for 4 days.  She has an appt on Monday but she doesn't think she can wait until Monday for a visit. Please advise

## 2013-05-02 NOTE — Telephone Encounter (Signed)
Will forward to MD. Colleen Olson,CMA  

## 2013-05-02 NOTE — Telephone Encounter (Signed)
Spoke with daughter and she is ok with waiting til Monday, so she doesn't miss anymore classes.  There is someone at home with the patient and seems to be doing ok.  Pt is acting very depressed.  Gave daughter message from MD.  Burnard Hawthorne

## 2013-05-02 NOTE — Telephone Encounter (Signed)
Please let patient know that if she is still feeling poorly, she should go to the emergency room for evaluation. Unlikely she can get an appointment here today, but possibly can be seen tomorrow.  Also, according to our last office note, her labs were much improved after her hospitalization when we checked them and her oxygen saturation was normal as well.   If she is in any way a harm to herself, she should call 9-1-1 or ask someone to take her immediately to the hospital.  Ricki Miller. Davielle Lingelbach, M.D.

## 2013-05-06 ENCOUNTER — Ambulatory Visit (INDEPENDENT_AMBULATORY_CARE_PROVIDER_SITE_OTHER): Payer: Medicare Other | Admitting: Family Medicine

## 2013-05-06 ENCOUNTER — Encounter: Payer: Self-pay | Admitting: Family Medicine

## 2013-05-06 VITALS — BP 124/85 | HR 102 | Temp 98.2°F | Ht 68.0 in | Wt 328.0 lb

## 2013-05-06 DIAGNOSIS — F32A Depression, unspecified: Secondary | ICD-10-CM

## 2013-05-06 DIAGNOSIS — I10 Essential (primary) hypertension: Secondary | ICD-10-CM

## 2013-05-06 DIAGNOSIS — E669 Obesity, unspecified: Secondary | ICD-10-CM

## 2013-05-06 DIAGNOSIS — F329 Major depressive disorder, single episode, unspecified: Secondary | ICD-10-CM

## 2013-05-06 DIAGNOSIS — B1920 Unspecified viral hepatitis C without hepatic coma: Secondary | ICD-10-CM

## 2013-05-06 DIAGNOSIS — M549 Dorsalgia, unspecified: Secondary | ICD-10-CM

## 2013-05-06 DIAGNOSIS — R296 Repeated falls: Secondary | ICD-10-CM

## 2013-05-06 DIAGNOSIS — Z9181 History of falling: Secondary | ICD-10-CM

## 2013-05-06 MED ORDER — VENLAFAXINE HCL 37.5 MG PO TABS: 37.5000 mg | ORAL_TABLET | Freq: Two times a day (BID) | ORAL | Status: DC

## 2013-05-06 NOTE — Assessment & Plan Note (Signed)
Increased pain with increased falls. Encouraged her to NOT take too many pain pills despite her pain. Try NSAID and heat/ice for areas as well. F/u prior to next narcotic refills.

## 2013-05-06 NOTE — Assessment & Plan Note (Signed)
Referral to [facility] placed for falls. Encouraged her to ask for help and always use her walker. She agrees.

## 2013-05-06 NOTE — Patient Instructions (Addendum)
For depression: - Nortriptyline every other night for one week then stop - Stop Paxil, start Effexor (at the pharmacy) - Call [facility] [phone] to get an appointment  For your sleep: - I am putting in a referral for sleep study  For your falls: - Call [facility] to see if you qualify for services - Talk to Arlys John about safety  I will see you back on the 24th.

## 2013-05-06 NOTE — Assessment & Plan Note (Signed)
PHQ-9 score 24. She is on Paxil and TCA at night. Will wean off TCA to see if that will help with her cough and falls at night. Change Paxil to Effexor to help with depression as well as chronic pain. Given number for [facility] to make appointment. Unsure if this is related to her memory loss, continue to monitor. I am concerned that her sleep patterns may be affecting this, will refer to sleep study as well. F/u in 2 weeks.

## 2013-05-06 NOTE — Assessment & Plan Note (Signed)
Will check labs at next visit. [facility].

## 2013-05-06 NOTE — Progress Notes (Signed)
Patient ID: Analiza Cowger, female   DOB: 1961-06-04, 52 y.o.   MRN: [id number]  [name] Cambrey Lupi M. Yue Flanigan, MD Phone: [phone]   Subjective: HPI: Patient is a 52 y.o. female presenting to clinic today for follow up. Her daughter is with her and has many things she would like to discuss. Concerns today include pain, depression, SOB, memory loss. We prioritized the problem list to the concerns below:  1. Hepatitis - Went to hepatologist on 10/2. Awaiting office note. No new information from the daughter at this time. Still on Aldactone and Lasix. Jaundice improving.   2. Depression - Stopped drinking at time of hospitalizaion. Daughter states she is anxious and crying. Anxiety runs in the family and she states her mom is more anxious than usual. Patient is not sleeping (multiple reasons including cough, crying, anxiety.) No SI/HI. On Paxil 20mg  for over one year, which does not help. Nortriptyline qhs but daughter states when she was on this it made her cough and interrupted her sleep as well. Ms. Stubbe is having some increased memory issues as well.  3. Pain - On Oxycodone TID. She took increased doses when she was falling. She has bruises on her, but no other injuries. She is NOT on Tylenol at this time. She does not need an early refill today.  4. Falls - Not sure why she is falling more. She states sometimes her legs give out on her, despite walking with a walker. She has had 7 falls at home. Daughter is concerned about her safety and would like to be evaluated for home health services since Ms. Blann is home alone.  History Reviewed: Non smoker. Health Maintenance: ?states she already had flu shot  ROS: Please see HPI above.  Objective: Office vital signs reviewed. BP 124/85  Pulse 102  Temp(Src) 98.2 F (36.8 C) (Oral)  Ht 5\' 8"  (1.727 m)  Wt 328 lb (148.78 kg)  BMI 49.88 kg/m2  Physical Examination:  General: Awake, alert. NAD. Daughter at  bedside. She has some issues with remembering simple instructions HEENT: Atraumatic, normocephalic. Poor dentition  Pulm: CTAB, no wheezes Cardio: RRR, systolic flow murmur Abdomen: obese, +BS. Edema is less in dependent areas Extremities: 1-2+ edema R>L. Moves all extremities. Large bruise on right upper arm. Stumbled when trying to stand up, but did not fall Neuro: Grossly intact  Assessment: 52 y.o. female follow up  Plan: See Problem List and After Visit Summary

## 2013-05-07 NOTE — Progress Notes (Signed)
[facility] referral put in on 05/06/13.

## 2013-05-17 ENCOUNTER — Emergency Department (HOSPITAL_COMMUNITY): Payer: Medicare Other

## 2013-05-17 ENCOUNTER — Inpatient Hospital Stay (HOSPITAL_COMMUNITY)
Admission: EM | Admit: 2013-05-17 | Discharge: 2013-05-21 | DRG: 391 | Disposition: A | Discharge status: Skilled Nursing Facility | Payer: Medicare Other | Attending: Family Medicine | Admitting: Family Medicine

## 2013-05-17 ENCOUNTER — Encounter (HOSPITAL_COMMUNITY): Payer: Self-pay | Admitting: Emergency Medicine

## 2013-05-17 DIAGNOSIS — R1011 Right upper quadrant pain: Principal | ICD-10-CM | POA: Diagnosis present

## 2013-05-17 DIAGNOSIS — R748 Abnormal levels of other serum enzymes: Secondary | ICD-10-CM

## 2013-05-17 DIAGNOSIS — K8020 Calculus of gallbladder without cholecystitis without obstruction: Secondary | ICD-10-CM | POA: Diagnosis present

## 2013-05-17 DIAGNOSIS — R0600 Dyspnea, unspecified: Secondary | ICD-10-CM

## 2013-05-17 DIAGNOSIS — R7309 Other abnormal glucose: Secondary | ICD-10-CM | POA: Diagnosis present

## 2013-05-17 DIAGNOSIS — K7030 Alcoholic cirrhosis of liver without ascites: Secondary | ICD-10-CM

## 2013-05-17 DIAGNOSIS — K625 Hemorrhage of anus and rectum: Secondary | ICD-10-CM

## 2013-05-17 DIAGNOSIS — R1010 Upper abdominal pain, unspecified: Secondary | ICD-10-CM

## 2013-05-17 DIAGNOSIS — G8929 Other chronic pain: Secondary | ICD-10-CM | POA: Diagnosis present

## 2013-05-17 DIAGNOSIS — Z79899 Other long term (current) drug therapy: Secondary | ICD-10-CM

## 2013-05-17 DIAGNOSIS — B182 Chronic viral hepatitis C: Secondary | ICD-10-CM | POA: Diagnosis present

## 2013-05-17 DIAGNOSIS — K648 Other hemorrhoids: Secondary | ICD-10-CM | POA: Diagnosis present

## 2013-05-17 DIAGNOSIS — Z96659 Presence of unspecified artificial knee joint: Secondary | ICD-10-CM

## 2013-05-17 DIAGNOSIS — D696 Thrombocytopenia, unspecified: Secondary | ICD-10-CM

## 2013-05-17 DIAGNOSIS — F3289 Other specified depressive episodes: Secondary | ICD-10-CM | POA: Diagnosis present

## 2013-05-17 DIAGNOSIS — I10 Essential (primary) hypertension: Secondary | ICD-10-CM

## 2013-05-17 DIAGNOSIS — J45909 Unspecified asthma, uncomplicated: Secondary | ICD-10-CM | POA: Diagnosis present

## 2013-05-17 DIAGNOSIS — F1010 Alcohol abuse, uncomplicated: Secondary | ICD-10-CM | POA: Diagnosis present

## 2013-05-17 DIAGNOSIS — D638 Anemia in other chronic diseases classified elsewhere: Secondary | ICD-10-CM

## 2013-05-17 DIAGNOSIS — M549 Dorsalgia, unspecified: Secondary | ICD-10-CM | POA: Diagnosis present

## 2013-05-17 DIAGNOSIS — F329 Major depressive disorder, single episode, unspecified: Secondary | ICD-10-CM | POA: Diagnosis present

## 2013-05-17 DIAGNOSIS — R188 Other ascites: Secondary | ICD-10-CM | POA: Diagnosis present

## 2013-05-17 DIAGNOSIS — E871 Hypo-osmolality and hyponatremia: Secondary | ICD-10-CM

## 2013-05-17 DIAGNOSIS — R600 Localized edema: Secondary | ICD-10-CM

## 2013-05-17 DIAGNOSIS — E876 Hypokalemia: Secondary | ICD-10-CM

## 2013-05-17 DIAGNOSIS — F1020 Alcohol dependence, uncomplicated: Secondary | ICD-10-CM | POA: Diagnosis present

## 2013-05-17 DIAGNOSIS — D6959 Other secondary thrombocytopenia: Secondary | ICD-10-CM | POA: Diagnosis present

## 2013-05-17 DIAGNOSIS — D649 Anemia, unspecified: Secondary | ICD-10-CM | POA: Diagnosis present

## 2013-05-17 DIAGNOSIS — Z6841 Body Mass Index (BMI) 40.0 and over, adult: Secondary | ICD-10-CM

## 2013-05-17 DIAGNOSIS — R296 Repeated falls: Secondary | ICD-10-CM

## 2013-05-17 DIAGNOSIS — R17 Unspecified jaundice: Secondary | ICD-10-CM

## 2013-05-17 DIAGNOSIS — K7460 Unspecified cirrhosis of liver: Secondary | ICD-10-CM

## 2013-05-17 DIAGNOSIS — B1921 Unspecified viral hepatitis C with hepatic coma: Secondary | ICD-10-CM | POA: Diagnosis not present

## 2013-05-17 DIAGNOSIS — B1920 Unspecified viral hepatitis C without hepatic coma: Secondary | ICD-10-CM

## 2013-05-17 LAB — CREATININE, SERUM
Creatinine, Ser: 0.7 mg/dL (ref 0.50–1.10)
GFR calc Af Amer: >90 mL/min (ref >90)

## 2013-05-17 LAB — CBC
Hemoglobin: 9.3 g/dL — ABNORMAL LOW (ref 12.0–15.0)
MCH: 29.3 pg (ref 26.0–34.0)
MCH: 30 pg (ref 26.0–34.0)
MCHC: 34.2 g/dL (ref 30.0–36.0)
MCV: 85.2 fL (ref 78.0–100.0)
Platelets: 106 10*3/uL — ABNORMAL LOW (ref 150–400)
Platelets: 101 10*3/uL — ABNORMAL LOW (ref 150–400)
RBC: 3.1 MIL/uL — ABNORMAL LOW (ref 3.87–5.11)
RDW: 15.5 % (ref 11.5–15.5)

## 2013-05-17 LAB — COMPREHENSIVE METABOLIC PANEL
ALT: 32 U/L (ref 0–35)
Albumin: 1.9 g/dL — ABNORMAL LOW (ref 3.5–5.2)
Alkaline Phosphatase: 164 U/L — ABNORMAL HIGH (ref 39–117)
Calcium: 8.5 mg/dL (ref 8.4–10.5)
GFR calc Af Amer: >90 mL/min (ref >90)
Glucose, Bld: 115 mg/dL — ABNORMAL HIGH (ref 70–99)
Potassium: 3.3 mEq/L — ABNORMAL LOW (ref 3.5–5.1)
Sodium: 136 mEq/L (ref 135–145)
Total Protein: 8.5 g/dL — ABNORMAL HIGH (ref 6.0–8.3)

## 2013-05-17 LAB — PROTIME-INR
INR: 2.18 — ABNORMAL HIGH (ref 0.00–1.49)
Prothrombin Time: 23.6 seconds — ABNORMAL HIGH (ref 11.6–15.2)

## 2013-05-17 LAB — ETHANOL: Alcohol, Ethyl (B): <11 mg/dL (ref 0–11)

## 2013-05-17 MED ORDER — POLYETHYLENE GLYCOL 3350 17 G PO PACK
17.0000 g | PACK | Freq: Every day | ORAL | Status: DC
  Administered 2013-05-19 – 2013-05-20 (×2): 17 g via ORAL
  Filled 2013-05-17 (×5): qty 1

## 2013-05-17 MED ORDER — SPIRONOLACTONE 100 MG PO TABS
100.0000 mg | ORAL_TABLET | Freq: Every day | ORAL | Status: DC
  Administered 2013-05-18 – 2013-05-21 (×4): 100 mg via ORAL
  Filled 2013-05-17 (×4): qty 1

## 2013-05-17 MED ORDER — IOHEXOL 300 MG/ML  SOLN
100.0000 mL | Freq: Once | INTRAMUSCULAR | Status: AC | PRN
  Administered 2013-05-17: 100 mL via INTRAVENOUS

## 2013-05-17 MED ORDER — ONDANSETRON HCL 4 MG/2ML IJ SOLN: 4.0000 mg | Freq: Four times a day (QID) | INTRAMUSCULAR | Status: DC | PRN

## 2013-05-17 MED ORDER — ONDANSETRON HCL 4 MG/2ML IJ SOLN
4.0000 mg | Freq: Once | INTRAMUSCULAR | Status: AC
  Administered 2013-05-17: 4 mg via INTRAVENOUS
  Filled 2013-05-17: qty 2

## 2013-05-17 MED ORDER — VENLAFAXINE HCL 37.5 MG PO TABS
37.5000 mg | ORAL_TABLET | Freq: Two times a day (BID) | ORAL | Status: DC
  Administered 2013-05-17 – 2013-05-21 (×8): 37.5 mg via ORAL
  Filled 2013-05-17 (×9): qty 1

## 2013-05-17 MED ORDER — MORPHINE SULFATE 4 MG/ML IJ SOLN
4.0000 mg | Freq: Once | INTRAMUSCULAR | Status: AC
  Administered 2013-05-17: 4 mg via INTRAVENOUS
  Filled 2013-05-17: qty 1

## 2013-05-17 MED ORDER — LORATADINE 10 MG PO TABS
10.0000 mg | ORAL_TABLET | Freq: Every day | ORAL | Status: DC
  Administered 2013-05-18 – 2013-05-21 (×4): 10 mg via ORAL
  Filled 2013-05-17 (×4): qty 1

## 2013-05-17 MED ORDER — HEPARIN SODIUM (PORCINE) 5000 UNIT/ML IJ SOLN
5000.0000 [IU] | Freq: Three times a day (TID) | INTRAMUSCULAR | Status: DC
  Administered 2013-05-17 – 2013-05-19 (×4): 5000 [IU] via SUBCUTANEOUS
  Filled 2013-05-17 (×8): qty 1

## 2013-05-17 MED ORDER — BENZONATATE 100 MG PO CAPS
100.0000 mg | ORAL_CAPSULE | Freq: Three times a day (TID) | ORAL | Status: DC | PRN
  Administered 2013-05-19: 100 mg via ORAL
  Filled 2013-05-17: qty 1

## 2013-05-17 MED ORDER — SODIUM CHLORIDE 0.9 % IV SOLN
INTRAVENOUS | Status: DC
  Administered 2013-05-17 – 2013-05-19 (×4): via INTRAVENOUS
  Administered 2013-05-20: 1000 mL via INTRAVENOUS

## 2013-05-17 MED ORDER — SENNA 8.6 MG PO TABS
1.0000 | ORAL_TABLET | Freq: Two times a day (BID) | ORAL | Status: DC
  Administered 2013-05-17 – 2013-05-21 (×8): 8.6 mg via ORAL
  Filled 2013-05-17 (×9): qty 1

## 2013-05-17 MED ORDER — IOHEXOL 300 MG/ML  SOLN
25.0000 mL | INTRAMUSCULAR | Status: AC
  Administered 2013-05-17: 25 mL via ORAL

## 2013-05-17 MED ORDER — MORPHINE SULFATE 4 MG/ML IJ SOLN
4.0000 mg | Freq: Once | INTRAMUSCULAR | Status: DC
  Filled 2013-05-17: qty 1

## 2013-05-17 MED ORDER — ONDANSETRON HCL 4 MG PO TABS: 4.0000 mg | ORAL_TABLET | Freq: Four times a day (QID) | ORAL | Status: DC | PRN

## 2013-05-17 MED ORDER — OXYCODONE HCL 5 MG PO TABS
5.0000 mg | ORAL_TABLET | Freq: Three times a day (TID) | ORAL | Status: DC | PRN
  Administered 2013-05-18 – 2013-05-19 (×2): 5 mg via ORAL
  Filled 2013-05-17 (×2): qty 1

## 2013-05-17 MED ORDER — FLUTICASONE PROPIONATE 50 MCG/ACT NA SUSP
2.0000 | Freq: Every day | NASAL | Status: DC
  Administered 2013-05-18 – 2013-05-21 (×4): 2 via NASAL
  Filled 2013-05-17: qty 16

## 2013-05-17 MED ORDER — HYDROCHLOROTHIAZIDE 25 MG PO TABS
25.0000 mg | ORAL_TABLET | Freq: Every day | ORAL | Status: DC
  Administered 2013-05-17 – 2013-05-21 (×5): 25 mg via ORAL
  Filled 2013-05-17 (×5): qty 1

## 2013-05-17 MED ORDER — ALBUTEROL SULFATE HFA 108 (90 BASE) MCG/ACT IN AERS: 2.0000 | INHALATION_SPRAY | Freq: Four times a day (QID) | RESPIRATORY_TRACT | Status: DC | PRN

## 2013-05-17 MED ORDER — FUROSEMIDE 40 MG PO TABS
40.0000 mg | ORAL_TABLET | Freq: Every day | ORAL | Status: DC
  Administered 2013-05-17 – 2013-05-21 (×5): 40 mg via ORAL
  Filled 2013-05-17 (×5): qty 1

## 2013-05-17 MED ORDER — HYDROMORPHONE HCL PF 1 MG/ML IJ SOLN
1.0000 mg | INTRAMUSCULAR | Status: DC | PRN
  Administered 2013-05-17 – 2013-05-19 (×5): 1 mg via INTRAVENOUS
  Filled 2013-05-17 (×5): qty 1

## 2013-05-17 NOTE — ED Notes (Signed)
Pt reports for the past 2 days she noticed bright red blood in her stool. States that when she wipes she notices blood on the paper. Also reports a non-productive cough for the past couple of weeks. States that she also has a burning sensation in her abd.

## 2013-05-17 NOTE — ED Provider Notes (Signed)
CSN: [id number]     Arrival date & time 05/17/13  1014 History   First MD Initiated Contact with Patient 05/17/13 1112     Chief Complaint  Patient presents with  . Rectal Bleeding   (Consider location/radiation/quality/duration/timing/severity/associated sxs/prior Treatment) HPI Comments: Patient is a 52 year old female with history of cirrhosis of the liver, asthma, hypertension, angina, osteoarthritis of both knees, exertional dyspnea, borderline diabetes, headache who presents today with one week of abdominal pain and bright red blood per red down in 2 weeks of nonproductive cough. He pain in her abdomen as a burning sensation over the right side of her body. This pain has been gradually worsening. She has nausea without vomiting. She also reports 2 weeks of feeling short of breath with non productive cough. "They tell me my oxygen level is fine, but I need oxygen!". Her family member reports they "must have spent over $200 on cough medicine" and none of it has provided any relief. No fevers, chills, vomiting. She is seen by Dr. Mikel Cella at [facility].   Patient is a 52 y.o. female presenting with hematochezia. The history is provided by the patient. No language interpreter was used.  Rectal Bleeding Associated symptoms: abdominal pain   Associated symptoms: no fever and no vomiting     Past Medical History  Diagnosis Date  . Asthma   . Hypertension   . Angina   . Osteoarthritis of both knees   . Exertional dyspnea 12/01/11  . Borderline diabetes   . Headache(784.0) 12/02/11    "sometimes when I wake up in am; real bad"  . Arthritis   . Chronic lower back pain   . Tachycardia    Past Surgical History  Procedure Laterality Date  . Joint replacement    . Total knee arthroplasty  03/02/2011    left  . Tonsillectomy      "I was a little girl"  . Tubal ligation  1982   Family History  Problem Relation Age of Onset  . Diabetes Mother   . Diabetes Father   . Diabetes  Sister   . Diabetes Brother    History  Substance Use Topics  . Smoking status: Never Smoker   . Smokeless tobacco: Never Used     Comment: 12/02/11 "around second hand smoke"  . Alcohol Use: Yes     Comment: 3-5 40oz. beers per day   OB History   Grav Para Term Preterm Abortions TAB SAB Ect Mult Living                 Review of Systems  Constitutional: Negative for fever and chills.  Respiratory: Positive for cough and shortness of breath.   Gastrointestinal: Positive for nausea, abdominal pain, blood in stool and hematochezia. Negative for vomiting.  All other systems reviewed and are negative.    Allergies  Review of patient's allergies indicates no known allergies.  Home Medications   Current Outpatient Rx  Name  Route  Sig  Dispense  Refill  . albuterol (PROVENTIL HFA;VENTOLIN HFA) 108 (90 BASE) MCG/ACT inhaler   Inhalation   Inhale 2 puffs into the lungs every 6 (six) hours as needed for wheezing.         . calcium carbonate (OS-CAL - DOSED IN MG OF ELEMENTAL CALCIUM) 1250 MG tablet   Oral   Take 1 tablet by mouth daily.         . cetirizine (ZYRTEC) 10 MG tablet   Oral   Take 10  mg by mouth daily.         . fluticasone (FLONASE) 50 MCG/ACT nasal spray   Nasal   Place 2 sprays into the nose daily.         . furosemide (LASIX) 40 MG tablet   Oral   Take 1 tablet (40 mg total) by mouth daily.   30 tablet   0   . nortriptyline (PAMELOR) 25 MG capsule   Oral   Take 50 mg by mouth at bedtime.         . Oxycodone HCl 10 MG TABS   Oral   Take 10 mg by mouth 3 (three) times daily as needed.         . promethazine-phenylephrine (PROMETHAZINE VC PLAIN) 6.25-5 MG/5ML SYRP   Oral   Take 5 mLs by mouth every 4 (four) hours as needed.   280 mL   0   . spironolactone (ALDACTONE) 100 MG tablet   Oral   Take 1 tablet (100 mg total) by mouth daily.   30 tablet   0   . venlafaxine (EFFEXOR) 37.5 MG tablet   Oral   Take 1 tablet (37.5 mg total)  by mouth 2 (two) times daily.   30 tablet   2    BP 137/70  Pulse 98  Temp(Src) 98.3 F (36.8 C) (Oral)  Resp 18  Ht 5\' 8"  (1.727 m)  Wt 325 lb (147.419 kg)  BMI 49.43 kg/m2  SpO2 98% Physical Exam  Nursing note and vitals reviewed. Constitutional: She is oriented to person, place, and time. She appears well-developed and well-nourished. No distress.  Morbidly obese  HENT:  Head: Normocephalic and atraumatic.  Right Ear: External ear normal.  Left Ear: External ear normal.  Nose: Nose normal.  Mouth/Throat: Oropharynx is clear and moist.  Eyes: Conjunctivae and EOM are normal. Pupils are equal, round, and reactive to light. Scleral icterus (mild) is present.  Neck: Normal range of motion.  Cardiovascular: Normal rate, regular rhythm and normal heart sounds.   Pulmonary/Chest: Effort normal and breath sounds normal. No stridor. No respiratory distress. She has no wheezes. She has no rales.  Abdominal: Soft. She exhibits no distension. There is tenderness in the right upper quadrant and right lower quadrant. There is no rigidity and no guarding.  Genitourinary: Rectal exam shows tenderness. Rectal exam shows anal tone normal. Guaiac positive stool.  Pt is very tender around rectum. No gross blood visualized.   Musculoskeletal: Normal range of motion.  Neurological: She is alert and oriented to person, place, and time. She has normal strength.  Skin: Skin is warm and dry. She is not diaphoretic. No erythema.  Psychiatric: She has a normal mood and affect. Her behavior is normal.    ED Course  Procedures (including critical care time) Labs Review Labs Reviewed  CBC - Abnormal; Notable for the following:    RBC 3.11 (*)    Hemoglobin 9.1 (*)    HCT 26.6 (*)    Platelets 106 (*)    All other components within normal limits  COMPREHENSIVE METABOLIC PANEL - Abnormal; Notable for the following:    Potassium 3.3 (*)    Glucose, Bld 115 (*)    BUN <3 (*)    Total Protein 8.5  (*)    Albumin 1.9 (*)    AST 83 (*)    Alkaline Phosphatase 164 (*)    Total Bilirubin 4.4 (*)    All other components within normal limits  CBC -  Abnormal; Notable for the following:    RBC 3.10 (*)    Hemoglobin 9.3 (*)    HCT 26.4 (*)    RDW 15.7 (*)    Platelets 101 (*)    All other components within normal limits  PROTIME-INR - Abnormal; Notable for the following:    Prothrombin Time 23.6 (*)    INR 2.18 (*)    All other components within normal limits  OCCULT BLOOD, POC DEVICE - Abnormal; Notable for the following:    Fecal Occult Bld POSITIVE (*)    All other components within normal limits  ETHANOL  LIPASE, BLOOD  CREATININE, SERUM  COMPREHENSIVE METABOLIC PANEL  CBC   Imaging Review Dg Chest 2 View  05/17/2013   CLINICAL DATA:  Rectal bleeding. Right abdominal pain.  EXAM: CHEST  2 VIEW  COMPARISON:  Chest radiograph 04/07/2013; chest CT 04/07/2013.  FINDINGS: Stable enlarged cardiac and mediastinal contours. Persistent elevation of the right hemidiaphragm. Left-greater-than-right basilar atelectasis. No large consolidative pulmonary opacity. No pleural effusion or pneumothorax.  IMPRESSION: 1. Cardiomegaly 2. Elevation of the right hemidiaphragm. Left basilar atelectasis.   Electronically Signed   By: Annia Belt M.D.   On: 05/17/2013 12:37   US Abdomen Complete  05/17/2013   CLINICAL DATA:  Right upper quadrant pain.  EXAM: ULTRASOUND ABDOMEN COMPLETE  COMPARISON:  CT earlier today.  FINDINGS: Gallbladder  Multiple gallstones within the gallbladder. Layering sludge also noted. The largest gallstone measures 2.6 cm. Gallbladder wall is borderline thickened at 3-4 mm. Negative sonographic Murphy's. No pericholecystic fluid.  Common bile duct  Diameter: Normal caliber, 5 mm.  Liver  Increased echotexture throughout the liver compatible with fatty infiltration. No focal abnormality.  IVC  Not visualized due to overlying bowel gas.  Pancreas  Not visualized due to overlying  bowel gas.  Spleen  Size and appearance within normal limits.  Right Kidney  Length: 10.8 cm. Echogenicity within normal limits. No mass or hydronephrosis visualized.  Left Kidney  Length: 11.0 cm. Echogenicity within normal limits. No mass or hydronephrosis visualized.  Abdominal aorta  No aneurysm visualized.  IMPRESSION: Cholelithiasis. Borderline gallbladder wall thickening, likely related to chronic cholecystitis. No definite sonographic evidence of acute cholecystitis.  Fatty liver.   Electronically Signed   By: Charlett Nose M.D.   On: 05/17/2013 17:15   Ct Abdomen Pelvis W Contrast  05/17/2013   CLINICAL DATA:  Bright red blood in stool. Burning in the abdomen. Nonproductive cough.  EXAM: CT ABDOMEN AND PELVIS WITH CONTRAST  TECHNIQUE: Multidetector CT imaging of the abdomen and pelvis was performed using the standard protocol following bolus administration of intravenous contrast.  CONTRAST:  OMNIPAQUE IOHEXOL 300 MG/ML  SOLN  COMPARISON:  04/19/2013  FINDINGS: Motion artifact severely limits the study.  There is consolidation in the lateral basal segment of the left lower lobe. Subsegmental atelectasis at the right base.  The liver has a cirrhotic appearance. There is a small amount of free fluid surrounding the liver. There is a persistent ill-defined mass measuring 1.6 cm in the medial segment of the left lobe of the liver on image 17 of series 2.  Calculus in the right kidney on image 24 is stable.  Gallstones are noted. There is gallbladder wall thickening.  Spleen and pancreas are grossly within normal limits.  There is stranding along the right pericolic gutter as well as an ill-defined appearance of the wall of the ascending colon. The appendix has a normal appearance.  No disproportionate  dilatation of small bowel to suggest obstruction.  Bladder, uterus, and adnexa are grossly within normal limits.  There is a small amount of free fluid layering in the pelvis.  Stranding throughout the  subcutaneous fat of the abdomen and back is noted compatible with diffuse spotty edema.  IMPRESSION: Gallstones. There is evidence of gallbladder wall thickening and inflammatory change. Acute cholecystitis is not excluded.  Cirrhotic liver.  Persistent small mass in the medial segment of the left lobe of the liver. Small hepatoma is not excluded.  Free fluid around the liver, in the right pericolic gutter, and in the pelvis.  There is inflammatory change along the ascending colon and possible inflammatory change of the wall of the ascending colon. No evidence of acute appendicitis. None inflammatory process is not excluded.  Left lower lobe consolidation in the lungs.   Electronically Signed   By: Maryclare Bean M.D.   On: 05/17/2013 15:42    EKG Interpretation     Ventricular Rate:    PR Interval:    QRS Duration:   QT Interval:    QTC Calculation:   R Axis:     Text Interpretation:              MDM   1. Rectal bleeding    Patient presents with rectal bleeding and abdominal pain. Hemoccult positive. CT scan and [place] of abd show cholelithiasis without acute cholecystitis, cirrhotic liver, persistent small mass in the medial segment of the left lobe of the liver, and inflammatory change along the ascending colon and possible inflammatory change of the wall of the ascending colon. Due to many co morbidities patient was admitted to family practice for further evaluation. Dr. Gwendolyn Grant evaluated this patient and agrees with plan. Vital signs stable. The patient appears reasonably stabilized for admission considering the current resources, flow, and capabilities available in the ED at this time, and I doubt any other [facility] requiring further screening and/or treatment in the ED prior to admission.     Mora Bellman, PA-C 05/17/13 2141

## 2013-05-17 NOTE — Progress Notes (Signed)
  Pt admitted to the unit. Pt is stable, alert and oriented per baseline. Oriented to room, staff, and call bell. Educated to call for any assistance. Bed in lowest position, call bell within reach- will continue to monitor. 

## 2013-05-17 NOTE — H&P (Signed)
Family Medicine Teaching [facility] Admission History and Physical Service Pager: [phone]  Patient name: Colleen Olson Medical record number: [id number] Date of birth: 1961/04/25 Age: 52 y.o. Gender: female  Primary Care Provider: Rodman Pickle, MD Consultants: General Surgery Code Status: Partial Code. DNI  Chief Complaint: bloody stools  Assessment and Plan: Colleen Olson is a 52 y.o. female presenting with acute abdominal pain, bloody stools. PMH is significant for Asthma, HTN, Borderline DM, HA, Arthritis, Chornic back pain, ETOH abuse, Hepatic cirrhosis and hepatitis C  # Abdominal pain: Localized to the RUQ w/ CT scan w/ cholelithiasis concerning for possible cholecystitis. Murphy's sign positive. No WBC and afebrile but in significant pain. No other acute process noted. Not likely pancreatitis - Admit to Med Surge - IV Dialudid 1mg  Q3 PRN - IVF of NS 127ml/hr - Consult to Gen surge - want to make aware of pt in case becomes surgical case. - CBC to monitor for WBC - NPO  - Lipase  # Cirrhosis: H/o Hep C and ETOH abuse. Last ETOH drink ~ 6 wks ago per pt. Scleral icterus is worse per pt. Diffuse mild itching likely from elevated bili which is chronic symptom for pt. No sign of overt worsening. Reports compliance on lasix and spiro. Dysphagia concerning for varices but acute onset makes less likely. Bruising likely from thrombocytopenia from poor hepatic fxn. Of note albumin 1.9, Alk PHos 164, T-Bili 4.4. - Consider benefit of Propranolol for likely varices and HTN control - ETOH level - cont home Spiro 100 / Lasix 40 - CIWA  # Res: h/o asthma. Intermittent SOB per pt when abdominal pain flairs. No SOB otherwise is less concerning for acute respiratory process. Resp exam benign and CXR w/ possible atelectasis. Likely poor effort due to pain on deep inspiration in RUQ of abd. No O2 requirement - O2 PRN - Cont Albuterol Q4 PRN - Cont home flonase and claritin for upper  airway congestion - Tessalon perles PRN cough  # MSK: H/o chronic back pain on Oxy 10mg  TID - decrease oxy to 5mg  Q8 PRN for breakthrough - No tylenol due to cirrhosis  # Endocrine: h/o borderline DM. No previous A1c and not on any medications for hyperglycemia - A1c  #CV: H/o HTN and pt is hypertensive in ED. Likely from inability to take medications and pain while in ED. No sign of CHF (last Echo 11/2011 w/ EF 65% and no diastolic dysfunction) - Continue home   # Heme: Anemia (at baseline) and thrombocytopenia. All likely of chronic disease and from hepatic cirrhosis. Ne need for transfusion.  - CBC in am - coags  # Bloody stool: x1 episode today w/ well formed stool and only small amount of blood on toilet paper and red water in toilet. No diverticulosis on CT but cannot r/o. FOBT + in ED.  Likely from internal hemorrhoids given lack of change in stool habit/nature, low blood volume, and only one occurance. Hgb stable at baseline (between 9-10). Pt not on any anticoaglation. Last colonoscopy in 2012. Report unavailable. Rectal exam w/o gross blood, small external skin tags, and w/o fissure. - CBC in AM - Protonix - Clarify last colonoscopy w/ pt  # Psych: Depression well controlled on home medications - Continue Effexor  # FEN/GI: NPO and IVF as above - Senna BID and Miralax daily - Zofran PRN  Prophylaxis: Hep [place] TID  Disposition: pending improvement  History of Present Illness: Colleen Olson is a 52 y.o. female presenting with one day h/o  bloody BMs. Did not see the color of the stool. Noted blood on the toilet paper and in water of toilet. No hematochezia, BMs formed. On stool today.   Also w/ RUQ abdominal pain. Constant w/ intermittent flaring. Squeezing pain. Onset 2 nights ago. Getting worse. Pt has RUQ pain chronically but this is significantly worse per pt. No alleviating factors. No worse w/ meals. Decreased PO due to dysphagia to solids only. Emesis x1 last night.  Denies fevers.   Last ETOH use on 04/07/13   Review Of Systems: Per HPI with the following additions: none Otherwise 12 point review of systems was performed and was unremarkable.  Patient Active Problem List   Diagnosis Date Noted  . Frequent falls 05/06/2013  . Depression 05/06/2013  . Hepatitis C 04/17/2013  . Dyspnea 04/08/2013  . Hypokalemia 04/08/2013  . Elevated liver enzymes 04/07/2013  . Elevated alkaline phosphatase level 04/07/2013  . Hyponatremia 04/07/2013  . Alcohol dependence 04/07/2013  . Elevated bilirubin 04/07/2013  . Edema of both legs 04/07/2013  . Sinusitis, chronic 02/28/2013  . Tachycardia 12/03/2011  . Hot flashes, menopausal 10/14/2010  . Morbid obesity 07/22/2010  . ALLERGIC RHINITIS 07/22/2010  . DEGENERATIVE JOINT DISEASE, BOTH KNEES, SEVERE 03/05/2010  . Essential hypertension, benign 08/31/2009  . OSTEOPENIA 08/24/2009  . ASTHMA, INTERMITTENT 08/17/2009  . BACK PAIN, CHRONIC 08/17/2009   Past Medical History: Past Medical History  Diagnosis Date  . Asthma   . Hypertension   . Angina   . Osteoarthritis of both knees   . Exertional dyspnea 12/01/11  . Borderline diabetes   . Headache(784.0) 12/02/11    "sometimes when I wake up in am; real bad"  . Arthritis   . Chronic lower back pain   . Tachycardia    Past Surgical History: Past Surgical History  Procedure Laterality Date  . Joint replacement    . Total knee arthroplasty  03/02/2011    left  . Tonsillectomy      "I was a little girl"  . Tubal ligation  1982   Social History: History  Substance Use Topics  . Smoking status: Never Smoker   . Smokeless tobacco: Never Used     Comment: 12/02/11 "around second hand smoke"  . Alcohol Use: Yes     Comment: 3-5 40oz. beers per day   Additional social history: none Please also refer to relevant sections of EMR.  Family History: Family History  Problem Relation Age of Onset  . Diabetes Mother   . Diabetes Father   . Diabetes Sister    . Diabetes Brother    Allergies and Medications: No Known Allergies No current facility-administered medications on file prior to encounter.   Current Outpatient Prescriptions on File Prior to Encounter  Medication Sig Dispense Refill  . albuterol (PROVENTIL HFA;VENTOLIN HFA) 108 (90 BASE) MCG/ACT inhaler Inhale 2 puffs into the lungs every 6 (six) hours as needed for wheezing.      . calcium carbonate (OS-CAL - DOSED IN MG OF ELEMENTAL CALCIUM) 1250 MG tablet Take 1 tablet by mouth daily.      . cetirizine (ZYRTEC) 10 MG tablet Take 10 mg by mouth daily.      . fluticasone (FLONASE) 50 MCG/ACT nasal spray Place 2 sprays into the nose daily.      . furosemide (LASIX) 40 MG tablet Take 1 tablet (40 mg total) by mouth daily.  30 tablet  0  . Oxycodone HCl 10 MG TABS Take 10 mg by mouth 3 (  three) times daily as needed.      . promethazine-phenylephrine (PROMETHAZINE VC PLAIN) 6.25-5 MG/5ML SYRP Take 5 mLs by mouth every 4 (four) hours as needed.  280 mL  0  . spironolactone (ALDACTONE) 100 MG tablet Take 1 tablet (100 mg total) by mouth daily.  30 tablet  0  . venlafaxine (EFFEXOR) 37.5 MG tablet Take 1 tablet (37.5 mg total) by mouth 2 (two) times daily.  30 tablet  2    Objective: BP 124/73  Pulse 99  Temp(Src) 98.6 F (37 C) (Oral)  Resp 17  Ht 5\' 8"  (1.727 m)  Wt 325 lb (147.419 kg)  BMI 49.43 kg/m2  SpO2 99% Exam: General: mod intermittent distress, obese HEENT: dry mm, EOMI Cardiovascular: RRR, no m/r/g Respiratory: CTAB, nml effort Abdomen: + Murphys sign w/ diffuse RUQ pain, other quadrants w/o pain and hypoactive BS Extremities: trace LE edema, pulses 2+ Skin: intact few ecchymosis primarily on R thigh Neuro: CN 2-12 grossly intact  Labs and Imaging: Results for orders placed during the hospital encounter of 05/17/13 (from the past 24 hour(s))  CBC     Status: Abnormal   Collection Time    05/17/13 10:23 AM      Result Value Range   WBC 6.5  4.0 - 10.5 K/uL   RBC  3.11 (*) 3.87 - 5.11 MIL/uL   Hemoglobin 9.1 (*) 12.0 - 15.0 g/dL   HCT 29.5 (*) 62.1 - 30.8 %   MCV 85.5  78.0 - 100.0 fL   MCH 29.3  26.0 - 34.0 pg   MCHC 34.2  30.0 - 36.0 g/dL   RDW 65.7  84.6 - 96.2 %   Platelets 106 (*) 150 - 400 K/uL  COMPREHENSIVE METABOLIC PANEL     Status: Abnormal   Collection Time    05/17/13 10:23 AM      Result Value Range   Sodium 136  135 - 145 mEq/L   Potassium 3.3 (*) 3.5 - 5.1 mEq/L   Chloride 102  96 - 112 mEq/L   CO2 25  19 - 32 mEq/L   Glucose, Bld 115 (*) 70 - 99 mg/dL   BUN <3 (*) 6 - 23 mg/dL   Creatinine, Ser 9.52  0.50 - 1.10 mg/dL   Calcium 8.5  8.4 - 84.1 mg/dL   Total Protein 8.5 (*) 6.0 - 8.3 g/dL   Albumin 1.9 (*) 3.5 - 5.2 g/dL   AST 83 (*) 0 - 37 U/L   ALT 32  0 - 35 U/L   Alkaline Phosphatase 164 (*) 39 - 117 U/L   Total Bilirubin 4.4 (*) 0.3 - 1.2 mg/dL   GFR calc non Af Amer >90  >90 mL/min   GFR calc Af Amer >90  >90 mL/min  OCCULT BLOOD, POC DEVICE     Status: Abnormal   Collection Time    05/17/13  1:14 PM      Result Value Range   Fecal Occult Bld POSITIVE (*) NEGATIVE   Dg Chest 2 View  05/17/2013   CLINICAL DATA:  Rectal bleeding. Right abdominal pain.  EXAM: CHEST  2 VIEW  COMPARISON:  Chest radiograph 04/07/2013; chest CT 04/07/2013.  FINDINGS: Stable enlarged cardiac and mediastinal contours. Persistent elevation of the right hemidiaphragm. Left-greater-than-right basilar atelectasis. No large consolidative pulmonary opacity. No pleural effusion or pneumothorax.  IMPRESSION: 1. Cardiomegaly 2. Elevation of the right hemidiaphragm. Left basilar atelectasis.   Electronically Signed   By: Francis Gaines.D.  On: 05/17/2013 12:37   US Abdomen Complete  05/17/2013   CLINICAL DATA:  Right upper quadrant pain.  EXAM: ULTRASOUND ABDOMEN COMPLETE  COMPARISON:  CT earlier today.  FINDINGS: Gallbladder  Multiple gallstones within the gallbladder. Layering sludge also noted. The largest gallstone measures 2.6 cm. Gallbladder  wall is borderline thickened at 3-4 mm. Negative sonographic Murphy's. No pericholecystic fluid.  Common bile duct  Diameter: Normal caliber, 5 mm.  Liver  Increased echotexture throughout the liver compatible with fatty infiltration. No focal abnormality.  IVC  Not visualized due to overlying bowel gas.  Pancreas  Not visualized due to overlying bowel gas.  Spleen  Size and appearance within normal limits.  Right Kidney  Length: 10.8 cm. Echogenicity within normal limits. No mass or hydronephrosis visualized.  Left Kidney  Length: 11.0 cm. Echogenicity within normal limits. No mass or hydronephrosis visualized.  Abdominal aorta  No aneurysm visualized.  IMPRESSION: Cholelithiasis. Borderline gallbladder wall thickening, likely related to chronic cholecystitis. No definite sonographic evidence of acute cholecystitis.  Fatty liver.   Electronically Signed   By: Charlett Nose M.D.   On: 05/17/2013 17:15   Ct Abdomen Pelvis W Contrast  05/17/2013   CLINICAL DATA:  Bright red blood in stool. Burning in the abdomen. Nonproductive cough.  EXAM: CT ABDOMEN AND PELVIS WITH CONTRAST  TECHNIQUE: Multidetector CT imaging of the abdomen and pelvis was performed using the standard protocol following bolus administration of intravenous contrast.  CONTRAST:  OMNIPAQUE IOHEXOL 300 MG/ML  SOLN  COMPARISON:  04/19/2013  FINDINGS: Motion artifact severely limits the study.  There is consolidation in the lateral basal segment of the left lower lobe. Subsegmental atelectasis at the right base.  The liver has a cirrhotic appearance. There is a small amount of free fluid surrounding the liver. There is a persistent ill-defined mass measuring 1.6 cm in the medial segment of the left lobe of the liver on image 17 of series 2.  Calculus in the right kidney on image 24 is stable.  Gallstones are noted. There is gallbladder wall thickening.  Spleen and pancreas are grossly within normal limits.  There is stranding along the right  pericolic gutter as well as an ill-defined appearance of the wall of the ascending colon. The appendix has a normal appearance.  No disproportionate dilatation of small bowel to suggest obstruction.  Bladder, uterus, and adnexa are grossly within normal limits.  There is a small amount of free fluid layering in the pelvis.  Stranding throughout the subcutaneous fat of the abdomen and back is noted compatible with diffuse spotty edema.  IMPRESSION: Gallstones. There is evidence of gallbladder wall thickening and inflammatory change. Acute cholecystitis is not excluded.  Cirrhotic liver.  Persistent small mass in the medial segment of the left lobe of the liver. Small hepatoma is not excluded.  Free fluid around the liver, in the right pericolic gutter, and in the pelvis.  There is inflammatory change along the ascending colon and possible inflammatory change of the wall of the ascending colon. No evidence of acute appendicitis. None inflammatory process is not excluded.  Left lower lobe consolidation in the lungs.   Electronically Signed   By: Maryclare Bean M.D.   On: 05/17/2013 15:42     Ozella Rocks, MD 05/17/2013, 6:39 PM PGY-3, [place] FPTS Intern pager: [phone], text pages welcome

## 2013-05-17 NOTE — ED Notes (Signed)
Pt presents for evaluation of RLQ pain for the past 5 days- states she noticed bright red blood in her stool at that point as well.  Today when wiping after a bowel movement there was an increased amount of bright red blood in the toilet- pt called PCP who instructed pt to come to ED.  Pt also complaining of productive cough for the past 2 days- yellow production- tried at home cough medication.

## 2013-05-17 NOTE — ED Notes (Signed)
CT paged. 

## 2013-05-18 DIAGNOSIS — K625 Hemorrhage of anus and rectum: Secondary | ICD-10-CM

## 2013-05-18 DIAGNOSIS — R1011 Right upper quadrant pain: Secondary | ICD-10-CM

## 2013-05-18 DIAGNOSIS — K8020 Calculus of gallbladder without cholecystitis without obstruction: Secondary | ICD-10-CM

## 2013-05-18 DIAGNOSIS — R109 Unspecified abdominal pain: Secondary | ICD-10-CM

## 2013-05-18 LAB — COMPREHENSIVE METABOLIC PANEL
AST: 94 U/L — ABNORMAL HIGH (ref 0–37)
Albumin: 1.9 g/dL — ABNORMAL LOW (ref 3.5–5.2)
Chloride: 106 mEq/L (ref 96–112)
Creatinine, Ser: 0.74 mg/dL (ref 0.50–1.10)
GFR calc Af Amer: >90 mL/min (ref >90)
Potassium: 4.6 mEq/L (ref 3.5–5.1)
Total Bilirubin: 4.6 mg/dL — ABNORMAL HIGH (ref 0.3–1.2)
Total Protein: 8.5 g/dL — ABNORMAL HIGH (ref 6.0–8.3)

## 2013-05-18 LAB — CBC
HCT: 26.4 % — ABNORMAL LOW (ref 36.0–46.0)
Hemoglobin: 8.9 g/dL — ABNORMAL LOW (ref 12.0–15.0)
MCH: 29.4 pg (ref 26.0–34.0)
MCV: 87.1 fL (ref 78.0–100.0)
Platelets: 96 10*3/uL — ABNORMAL LOW (ref 150–400)
RBC: 3.03 MIL/uL — ABNORMAL LOW (ref 3.87–5.11)
RDW: 15.6 % — ABNORMAL HIGH (ref 11.5–15.5)

## 2013-05-18 MED ORDER — PHYTONADIONE 5 MG PO TABS
2.5000 mg | ORAL_TABLET | Freq: Once | ORAL | Status: AC
  Administered 2013-05-18: 2.5 mg via ORAL
  Filled 2013-05-18 (×2): qty 1

## 2013-05-18 NOTE — H&P (Signed)
Family Medicine Teaching Service Attending Note  I interviewed and examined patient Colleen Olson and reviewed their tests and x-rays.  I discussed with Dr. Konrad Dolores and reviewed their note for today.  I agree with their assessment and plan.     Additionally  Continued RUQ pain worse with movement and cough No further bleeding  Persistent RUQ pain with gallstones but no definite inflamation.  No signs of obstruction by labs Case is complicated by ascites and liver failure No evidence of SBP Consult surgery for opinion on possible cholecystitis and other possible diagnosises

## 2013-05-18 NOTE — Consult Note (Signed)
Seen, agree with above.   Pt with childs C cirrhosis.  Operative mortality >80%.  Will get HIDA scan.  Would recommend treating with antibiotics.  If cholecystitis, would discuss perc chole tube with IR.  They may be unwilling to take on risk.

## 2013-05-18 NOTE — Consult Note (Signed)
Daphnee Preiss October 20, 1960  [id number].    Requesting MD: Dr. Mikel Cella Dr. Deirdre Priest Chief Complaint/Reason for Consult: RUQ abdominal pain HPI:  52 y.o. female presenting with RUQ abdominal pain for 3 days. Constant w/ intermittent flaring and has progressively worsened.  Pt has RUQ pain chronically but this is significantly worse per pt.  She says she was recently diagnosed with liver cirrhosis. Normally when she has abdominal pain she "drinks a beer and it goes away".  Also notes bloody stools.  No association to food.  Decreased PO due to dysphagia to solids only. Emesis x1 on night of admission. Denies fevers. States last ETOH use on 04/07/13.  No fever/chills.  Drinks 3-5 40oz beers per day prior to 04/07/13.     ROS: All systems reviewed and otherwise negative except for as above  Family History  Problem Relation Age of Onset  . Diabetes Mother   . Diabetes Father   . Diabetes Sister   . Diabetes Brother     Past Medical History  Diagnosis Date  . Asthma   . Hypertension   . Angina   . Osteoarthritis of both knees   . Exertional dyspnea 12/01/11  . Borderline diabetes   . Headache(784.0) 12/02/11    "sometimes when I wake up in am; real bad"  . Arthritis   . Chronic lower back pain   . Tachycardia     Past Surgical History  Procedure Laterality Date  . Joint replacement    . Total knee arthroplasty  03/02/2011    left  . Tonsillectomy      "I was a little girl"  . Tubal ligation  1982    Social History:  reports that she has never smoked. She has never used smokeless tobacco. She reports that she drinks alcohol. She reports that she does not use illicit drugs.  Allergies: No Known Allergies  Medications Prior to Admission  Medication Sig Dispense Refill  . albuterol (PROVENTIL HFA;VENTOLIN HFA) 108 (90 BASE) MCG/ACT inhaler Inhale 2 puffs into the lungs every 6 (six) hours as needed for wheezing.      . calcium carbonate (OS-CAL - DOSED IN MG OF ELEMENTAL CALCIUM)  1250 MG tablet Take 1 tablet by mouth daily.      . cetirizine (ZYRTEC) 10 MG tablet Take 10 mg by mouth daily.      . fluticasone (FLONASE) 50 MCG/ACT nasal spray Place 2 sprays into the nose daily.      . furosemide (LASIX) 40 MG tablet Take 1 tablet (40 mg total) by mouth daily.  30 tablet  0  . hydrochlorothiazide (HYDRODIURIL) 25 MG tablet Take 25 mg by mouth daily.      . Oxycodone HCl 10 MG TABS Take 10 mg by mouth 3 (three) times daily as needed.      . pentoxifylline (TRENTAL) 400 MG CR tablet Take 400 mg by mouth 3 (three) times daily with meals.      . promethazine-phenylephrine (PROMETHAZINE VC PLAIN) 6.25-5 MG/5ML SYRP Take 5 mLs by mouth every 4 (four) hours as needed.  280 mL  0  . spironolactone (ALDACTONE) 100 MG tablet Take 1 tablet (100 mg total) by mouth daily.  30 tablet  0  . venlafaxine (EFFEXOR) 37.5 MG tablet Take 1 tablet (37.5 mg total) by mouth 2 (two) times daily.  30 tablet  2    Blood pressure 134/84, pulse 106, temperature 98.8 F (37.1 C), temperature source Oral, resp. rate 18, height 5\' 8"  (1.727 m),  weight 319 lb (144.697 kg), SpO2 95.00%. Physical Exam: General: pleasant, WD/WN AA female who is laying in bed in some mild distress HEENT: head is normocephalic, atraumatic.  Sclera injected.  PERRL.  Ears and nose without any masses or lesions.  Mouth is pink and moist Heart: regular, rate, and rhythm.  No obvious murmurs, gallops, or rubs noted.  Palpable pedal pulses bilaterally Lungs: CTAB, no wheezes, rhonchi, or rales noted.  Respiratory effort nonlabored Abd: obese, soft, distended with ascites, moderate tenderness in the RUQ, +BS, no masses or hernias palpated MS: moves all 4 extremities symmetrically, Gross CSM intact, LE with b/l dependent edema Skin: warm and dry with no masses, lesions, or rashes, yellowish tint to skin Psych: A&Ox3 with an appropriate affect.  Results for orders placed during the hospital encounter of 05/17/13 (from the past 48  hour(s))  CBC     Status: Abnormal   Collection Time    05/17/13 10:23 AM      Result Value Range   WBC 6.5  4.0 - 10.5 K/uL   RBC 3.11 (*) 3.87 - 5.11 MIL/uL   Hemoglobin 9.1 (*) 12.0 - 15.0 g/dL   HCT 47.8 (*) 29.5 - 62.1 %   MCV 85.5  78.0 - 100.0 fL   MCH 29.3  26.0 - 34.0 pg   MCHC 34.2  30.0 - 36.0 g/dL   RDW 30.8  65.7 - 84.6 %   Platelets 106 (*) 150 - 400 K/uL   Comment: PLATELET COUNT CONFIRMED BY SMEAR  COMPREHENSIVE METABOLIC PANEL     Status: Abnormal   Collection Time    05/17/13 10:23 AM      Result Value Range   Sodium 136  135 - 145 mEq/L   Potassium 3.3 (*) 3.5 - 5.1 mEq/L   Chloride 102  96 - 112 mEq/L   CO2 25  19 - 32 mEq/L   Glucose, Bld 115 (*) 70 - 99 mg/dL   BUN <3 (*) 6 - 23 mg/dL   Comment: REPEATED TO VERIFY   Creatinine, Ser 0.74  0.50 - 1.10 mg/dL   Calcium 8.5  8.4 - 96.2 mg/dL   Total Protein 8.5 (*) 6.0 - 8.3 g/dL   Albumin 1.9 (*) 3.5 - 5.2 g/dL   AST 83 (*) 0 - 37 U/L   ALT 32  0 - 35 U/L   Alkaline Phosphatase 164 (*) 39 - 117 U/L   Total Bilirubin 4.4 (*) 0.3 - 1.2 mg/dL   GFR calc non Af Amer >90  >90 mL/min   GFR calc Af Amer >90  >90 mL/min   Comment: (NOTE)     The eGFR has been calculated using the CKD EPI equation.     This calculation has not been validated in all clinical situations.     eGFR's persistently <90 mL/min signify possible Chronic Kidney     Disease.  OCCULT BLOOD, POC DEVICE     Status: Abnormal   Collection Time    05/17/13  1:14 PM      Result Value Range   Fecal Occult Bld POSITIVE (*) NEGATIVE  ETHANOL     Status: None   Collection Time    05/17/13  9:01 PM      Result Value Range   Alcohol, Ethyl (B) <11  0 - 11 mg/dL   Comment:            LOWEST DETECTABLE LIMIT FOR     SERUM ALCOHOL IS 11 mg/dL  FOR MEDICAL PURPOSES ONLY  LIPASE, BLOOD     Status: None   Collection Time    05/17/13  9:01 PM      Result Value Range   Lipase 15  11 - 59 U/L  CBC     Status: Abnormal   Collection Time     05/17/13  9:01 PM      Result Value Range   WBC 9.1  4.0 - 10.5 K/uL   RBC 3.10 (*) 3.87 - 5.11 MIL/uL   Hemoglobin 9.3 (*) 12.0 - 15.0 g/dL   HCT 16.1 (*) 09.6 - 04.5 %   MCV 85.2  78.0 - 100.0 fL   MCH 30.0  26.0 - 34.0 pg   MCHC 35.2  30.0 - 36.0 g/dL   RDW 40.9 (*) 81.1 - 91.4 %   Platelets 101 (*) 150 - 400 K/uL   Comment: CONSISTENT WITH PREVIOUS RESULT  CREATININE, SERUM     Status: None   Collection Time    05/17/13  9:01 PM      Result Value Range   Creatinine, Ser 0.70  0.50 - 1.10 mg/dL   GFR calc non Af Amer >90  >90 mL/min   GFR calc Af Amer >90  >90 mL/min   Comment: (NOTE)     The eGFR has been calculated using the CKD EPI equation.     This calculation has not been validated in all clinical situations.     eGFR's persistently <90 mL/min signify possible Chronic Kidney     Disease.  PROTIME-INR     Status: Abnormal   Collection Time    05/17/13  9:01 PM      Result Value Range   Prothrombin Time 23.6 (*) 11.6 - 15.2 seconds   INR 2.18 (*) 0.00 - 1.49  COMPREHENSIVE METABOLIC PANEL     Status: Abnormal   Collection Time    05/18/13  6:09 AM      Result Value Range   Sodium 137  135 - 145 mEq/L   Potassium 4.6  3.5 - 5.1 mEq/L   Comment: DELTA CHECK NOTED   Chloride 106  96 - 112 mEq/L   CO2 25  19 - 32 mEq/L   Glucose, Bld 89  70 - 99 mg/dL   BUN 3 (*) 6 - 23 mg/dL   Creatinine, Ser 7.82  0.50 - 1.10 mg/dL   Calcium 8.2 (*) 8.4 - 10.5 mg/dL   Total Protein 8.5 (*) 6.0 - 8.3 g/dL   Albumin 1.9 (*) 3.5 - 5.2 g/dL   AST 94 (*) 0 - 37 U/L   ALT 31  0 - 35 U/L   Alkaline Phosphatase 162 (*) 39 - 117 U/L   Total Bilirubin 4.6 (*) 0.3 - 1.2 mg/dL   GFR calc non Af Amer >90  >90 mL/min   GFR calc Af Amer >90  >90 mL/min   Comment: (NOTE)     The eGFR has been calculated using the CKD EPI equation.     This calculation has not been validated in all clinical situations.     eGFR's persistently <90 mL/min signify possible Chronic Kidney     Disease.  CBC      Status: Abnormal   Collection Time    05/18/13  6:09 AM      Result Value Range   WBC 7.8  4.0 - 10.5 K/uL   RBC 3.03 (*) 3.87 - 5.11 MIL/uL   Hemoglobin 8.9 (*) 12.0 - 15.0  g/dL   HCT 78.2 (*) 95.6 - 21.3 %   MCV 87.1  78.0 - 100.0 fL   MCH 29.4  26.0 - 34.0 pg   MCHC 33.7  30.0 - 36.0 g/dL   RDW 08.6 (*) 57.8 - 46.9 %   Platelets 96 (*) 150 - 400 K/uL   Comment: CONSISTENT WITH PREVIOUS RESULT  GLUCOSE, CAPILLARY     Status: None   Collection Time    05/18/13 11:21 AM      Result Value Range   Glucose-Capillary 88  70 - 99 mg/dL   Dg Chest 2 View  62/95/2841   CLINICAL DATA:  Rectal bleeding. Right abdominal pain.  EXAM: CHEST  2 VIEW  COMPARISON:  Chest radiograph 04/07/2013; chest CT 04/07/2013.  FINDINGS: Stable enlarged cardiac and mediastinal contours. Persistent elevation of the right hemidiaphragm. Left-greater-than-right basilar atelectasis. No large consolidative pulmonary opacity. No pleural effusion or pneumothorax.  IMPRESSION: 1. Cardiomegaly 2. Elevation of the right hemidiaphragm. Left basilar atelectasis.   Electronically Signed   By: Annia Belt M.D.   On: 05/17/2013 12:37   US Abdomen Complete  05/17/2013   CLINICAL DATA:  Right upper quadrant pain.  EXAM: ULTRASOUND ABDOMEN COMPLETE  COMPARISON:  CT earlier today.  FINDINGS: Gallbladder  Multiple gallstones within the gallbladder. Layering sludge also noted. The largest gallstone measures 2.6 cm. Gallbladder wall is borderline thickened at 3-4 mm. Negative sonographic Murphy's. No pericholecystic fluid.  Common bile duct  Diameter: Normal caliber, 5 mm.  Liver  Increased echotexture throughout the liver compatible with fatty infiltration. No focal abnormality.  IVC  Not visualized due to overlying bowel gas.  Pancreas  Not visualized due to overlying bowel gas.  Spleen  Size and appearance within normal limits.  Right Kidney  Length: 10.8 cm. Echogenicity within normal limits. No mass or hydronephrosis visualized.  Left  Kidney  Length: 11.0 cm. Echogenicity within normal limits. No mass or hydronephrosis visualized.  Abdominal aorta  No aneurysm visualized.  IMPRESSION: Cholelithiasis. Borderline gallbladder wall thickening, likely related to chronic cholecystitis. No definite sonographic evidence of acute cholecystitis.  Fatty liver.   Electronically Signed   By: Charlett Nose M.D.   On: 05/17/2013 17:15   Ct Abdomen Pelvis W Contrast  05/17/2013   CLINICAL DATA:  Bright red blood in stool. Burning in the abdomen. Nonproductive cough.  EXAM: CT ABDOMEN AND PELVIS WITH CONTRAST  TECHNIQUE: Multidetector CT imaging of the abdomen and pelvis was performed using the standard protocol following bolus administration of intravenous contrast.  CONTRAST:  OMNIPAQUE IOHEXOL 300 MG/ML  SOLN  COMPARISON:  04/19/2013  FINDINGS: Motion artifact severely limits the study.  There is consolidation in the lateral basal segment of the left lower lobe. Subsegmental atelectasis at the right base.  The liver has a cirrhotic appearance. There is a small amount of free fluid surrounding the liver. There is a persistent ill-defined mass measuring 1.6 cm in the medial segment of the left lobe of the liver on image 17 of series 2.  Calculus in the right kidney on image 24 is stable.  Gallstones are noted. There is gallbladder wall thickening.  Spleen and pancreas are grossly within normal limits.  There is stranding along the right pericolic gutter as well as an ill-defined appearance of the wall of the ascending colon. The appendix has a normal appearance.  No disproportionate dilatation of small bowel to suggest obstruction.  Bladder, uterus, and adnexa are grossly within normal limits.  There is  a small amount of free fluid layering in the pelvis.  Stranding throughout the subcutaneous fat of the abdomen and back is noted compatible with diffuse spotty edema.  IMPRESSION: Gallstones. There is evidence of gallbladder wall thickening and  inflammatory change. Acute cholecystitis is not excluded.  Cirrhotic liver.  Persistent small mass in the medial segment of the left lobe of the liver. Small hepatoma is not excluded.  Free fluid around the liver, in the right pericolic gutter, and in the pelvis.  There is inflammatory change along the ascending colon and possible inflammatory change of the wall of the ascending colon. No evidence of acute appendicitis. None inflammatory process is not excluded.  Left lower lobe consolidation in the lungs.   Electronically Signed   By: Maryclare Bean M.D.   On: 05/17/2013 15:42       Assessment/Plan RUQ Abdominal pain Cholelithiasis, ?cholecystits vs ascites and liver disease H/o Hepatitis C with Cirrhosis Childs 3  ETOH abuse Hyperbilirubinemia Rectal bleeding  Plan: 1.  The patient is a very poor surgical candidate with a risk likely much >50% for complications and/or death  2.  Would obtain a HIDA scan to determine if true cystic duct obstruction.  If positive would recommend perc chole tube, but very likely to be solely from ascites/cirrhosis of liver and not true gallbladder disease 3.  Would not recommend surgical intervention in this patient at this time 4.  Will follow up with HIDA scan results, NPO AND NO PAIN MEDS 6HR PRIOR TO HIDA SCAN   DORT, Starling Jessie 05/18/2013, 3:06 PM Pager: [phone]

## 2013-05-18 NOTE — ED Provider Notes (Signed)
Medical screening examination/treatment/procedure(s) were conducted as a shared visit with non-physician practitioner(s) and myself.  I personally evaluated the patient during the encounter  Patient neurologic on. On my exam, patient with no focal belly pain. She does have some history of liver disease. She has for hemoglobin and is in the 9 range. Medicine admitting. She is in no acute distress is not having any vital sign abnormalities.  Dagmar Hait, MD 05/18/13 [phone]

## 2013-05-18 NOTE — Progress Notes (Signed)
Family Medicine Teaching Service Daily Progress Note Intern Pager: [phone]  Patient name: Colleen Olson Medical record number: [id number] Date of birth: [birth year] Age: 52 y.o. Gender: female  Primary Care Provider: Rodman Pickle, MD Consultants: None Code Status: FULL  Colleen Olson Overview and Major Events to Date:  05/17/13 - Admitted for rectal bleeding and abd pain  Assessment and Plan: Colleen Olson is a 52 y.o. female presenting with acute RUQ abdominal pain and bloody stools. PMH is significant for Asthma, HTN, Borderline DM, HA, Arthritis, Chornic back pain, ETOH abuse, Hepatic cirrhosis and non-active hepatitis C   # Abdominal pain: Localized to the RUQ w/ CT scan w/ cholelithiasis concerning for possible cholecystitis. Murphy's sign positive. No WBC and afebrile but in significant pain. No other acute process noted. Not likely pancreatitis with normal lipase. Abd [place] suggestive of chronic cholecystitis. Alk phos still elevated. - IV Dialudid 1mg  Q3 PRN. Patient has chronic narcotic use, would prefer to transition to PO medication as soon as tolerated. - Con't IVF at NS 144ml/hr  - Consider surgery consult - CBC to monitor for WBC (Still normal today) - NPO, consider advancing if not a surgical case  # Cirrhosis: H/o Hep C (although not active) and ETOH abuse. Last ETOH drink ~ 6 wks ago per Colleen Olson. Scleral icterus is worse per Colleen Olson. Diffuse mild itching likely from elevated bili which is chronic symptom for Colleen Olson. No sign of overt worsening. Reports compliance on lasix and spiro. Dysphagia concerning for varices but acute onset makes less likely. Bruising likely from thrombocytopenia from poor hepatic fxn. Of note albumin 1.9, Alk PHos 164, T-Bili 4.4.  - Consider benefit of Propranolol for likely varices and HTN control  - cont home Spiro 100 / Lasix 40  - CIWA   # Res: h/o asthma. Intermittent SOB per Colleen Olson when abdominal pain flairs. Also has chronic cough. No SOB otherwise is less  concerning for acute respiratory process. Resp exam benign and CXR w/ possible atelectasis. Likely poor effort due to pain on deep inspiration in RUQ of abd. No O2 requirement  - CT Abd read as LLL consolidation.  - Cough resolved at this time, per patient - O2 PRN (currently wearing for symptoms) - Cont Albuterol Q4 PRN  - Cont home flonase and claritin for upper airway congestion  - Tessalon perles PRN cough   # MSK: H/o chronic back pain on Oxy 10mg  TID  - decrease oxy to 5mg  Q8 PRN for breakthrough  - No tylenol due to cirrhosis   # Endocrine: h/o borderline DM. No previous A1c and not on any medications for hyperglycemia  - A1c order pending  #CV: H/o HTN, BP improved - Continue home regimen  # Heme: Anemia (at baseline) and thrombocytopenia. All likely of chronic disease and from hepatic cirrhosis.  No need for transfusion.  - CBC in am  - INR elevated to 2.18. Vit K x1 given reported rectal bleeding yesterday.  # Bloody stool: x1 episode yesterdayw/ well formed stool and only small amount of blood on toilet paper and red water in toilet. No diverticulosis on CT but cannot r/o. FOBT + in ED. Likely from internal hemorrhoids given lack of change in stool habit/nature, low blood volume, and only one occurance. Hgb stable at baseline (between 9-10). Colleen Olson not on any anticoaglation. Last colonoscopy in 2012. Report unavailable. Rectal exam w/o gross blood, small external skin tags, and w/o fissure.  - CBC in AM  - Protonix  - Clarify last colonoscopy w/ Colleen Olson   #  Psych: Depression well controlled on home medications  - Continue Effexor   FEN/GI: Senna BID and Miralax daily. NPO for now. PPx: Heparin  Disposition: Pending overall clinical improvement  Subjective: Colleen Olson states she is still having RUQ pain ,worse with deep inspiration. Overall, she states she feels a little better. No specific concerns other than the pain and feeling thirsty.   Objective: Temp:  [97.6 F (36.4 C)-98.6  F (37 C)] 97.6 F (36.4 C) (10/18 0616) Pulse Rate:  [98-103] 99 (10/18 0616) Resp:  [17-24] 18 (10/18 0616) BP: (112-148)/(61-91) 139/85 mmHg (10/18 0616) SpO2:  [94 %-99 %] 95 % (10/18 0616) Weight:  [319 lb (144.697 kg)-325 lb (147.419 kg)] 319 lb (144.697 kg) (10/17 2033)  Physical Exam: General: In bed, NAD. Awake, talking.  Cardiovascular: RRR Respiratory: Decreased effort secondary to abd pain. Anterior and lateral ascultation clear without wheeze or crackles  Abdomen: Obese, ascites of lower abdomen/pelvis. TTP entire right side, most prominent RUQ near ribs. Unable to appreciate any hepatomegaly secondary to body habitus Extremities: 1+ edema, tender to palpation Neuro: Mental status waxes/wanes  Laboratory:  Recent Labs Lab 05/17/13 1023 05/17/13 2101 05/18/13 0609  WBC 6.5 9.1 7.8  HGB 9.1* 9.3* 8.9*  HCT 26.6* 26.4* 26.4*  PLT 106* 101* 96*    Recent Labs Lab 05/17/13 1023 05/17/13 2101 05/18/13 0609  NA 136  --  137  K 3.3*  --  4.6  CL 102  --  106  CO2 25  --  25  BUN <3*  --  3*  CREATININE 0.74 0.70 0.74  CALCIUM 8.5  --  8.2*  PROT 8.5*  --  8.5*  BILITOT 4.4*  --  4.6*  ALKPHOS 164*  --  162*  ALT 32  --  31  AST 83*  --  94*  GLUCOSE 115*  --  89   Lipase 15  Imaging/Diagnostic Tests: Dg Chest 2 View  05/17/2013   CLINICAL DATA:  Rectal bleeding. Right abdominal pain.  EXAM: CHEST  2 VIEW  COMPARISON:  Chest radiograph 04/07/2013; chest CT 04/07/2013.  FINDINGS: Stable enlarged cardiac and mediastinal contours. Persistent elevation of the right hemidiaphragm. Left-greater-than-right basilar atelectasis. No large consolidative pulmonary opacity. No pleural effusion or pneumothorax.  IMPRESSION: 1. Cardiomegaly 2. Elevation of the right hemidiaphragm. Left basilar atelectasis.   Electronically Signed   By: Annia Belt M.D.   On: 05/17/2013 12:37   US Abdomen Complete  05/17/2013   CLINICAL DATA:  Right upper quadrant pain.  EXAM: ULTRASOUND  ABDOMEN COMPLETE  COMPARISON:  CT earlier today.  FINDINGS: Gallbladder  Multiple gallstones within the gallbladder. Layering sludge also noted. The largest gallstone measures 2.6 cm. Gallbladder wall is borderline thickened at 3-4 mm. Negative sonographic Murphy's. No pericholecystic fluid.  Common bile duct  Diameter: Normal caliber, 5 mm.  Liver  Increased echotexture throughout the liver compatible with fatty infiltration. No focal abnormality.  IVC  Not visualized due to overlying bowel gas.  Pancreas  Not visualized due to overlying bowel gas.  Spleen  Size and appearance within normal limits.  Right Kidney  Length: 10.8 cm. Echogenicity within normal limits. No mass or hydronephrosis visualized.  Left Kidney  Length: 11.0 cm. Echogenicity within normal limits. No mass or hydronephrosis visualized.  Abdominal aorta  No aneurysm visualized.  IMPRESSION: Cholelithiasis. Borderline gallbladder wall thickening, likely related to chronic cholecystitis. No definite sonographic evidence of acute cholecystitis.  Fatty liver.   Electronically Signed   By: Charlett Nose  M.D.   On: 05/17/2013 17:15   Ct Abdomen Pelvis W Contrast  05/17/2013   CLINICAL DATA:  Bright red blood in stool. Burning in the abdomen. Nonproductive cough.  EXAM: CT ABDOMEN AND PELVIS WITH CONTRAST  TECHNIQUE: Multidetector CT imaging of the abdomen and pelvis was performed using the standard protocol following bolus administration of intravenous contrast.  CONTRAST:  OMNIPAQUE IOHEXOL 300 MG/ML  SOLN  COMPARISON:  04/19/2013  FINDINGS: Motion artifact severely limits the study.  There is consolidation in the lateral basal segment of the left lower lobe. Subsegmental atelectasis at the right base.  The liver has a cirrhotic appearance. There is a small amount of free fluid surrounding the liver. There is a persistent ill-defined mass measuring 1.6 cm in the medial segment of the left lobe of the liver on image 17 of series 2.  Calculus in  the right kidney on image 24 is stable.  Gallstones are noted. There is gallbladder wall thickening.  Spleen and pancreas are grossly within normal limits.  There is stranding along the right pericolic gutter as well as an ill-defined appearance of the wall of the ascending colon. The appendix has a normal appearance.  No disproportionate dilatation of small bowel to suggest obstruction.  Bladder, uterus, and adnexa are grossly within normal limits.  There is a small amount of free fluid layering in the pelvis.  Stranding throughout the subcutaneous fat of the abdomen and back is noted compatible with diffuse spotty edema.  IMPRESSION: Gallstones. There is evidence of gallbladder wall thickening and inflammatory change. Acute cholecystitis is not excluded.  Cirrhotic liver.  Persistent small mass in the medial segment of the left lobe of the liver. Small hepatoma is not excluded.  Free fluid around the liver, in the right pericolic gutter, and in the pelvis.  There is inflammatory change along the ascending colon and possible inflammatory change of the wall of the ascending colon. No evidence of acute appendicitis. None inflammatory process is not excluded.  Left lower lobe consolidation in the lungs.   Electronically Signed   By: Maryclare Bean M.D.   On: 05/17/2013 15:42    Demari Kropp Nydia Bouton, MD 05/18/2013, 9:24 AM PGY-3, [facility] FPTS Intern pager: [phone], text pages welcome

## 2013-05-18 NOTE — Progress Notes (Signed)
Patient stated during admission assessment that she would like staff to "do whatever is necessary to bring me back if something happens". RN clarified code status with patient, and patient stated she would like to be a full code. MD text paged to notify.

## 2013-05-19 ENCOUNTER — Inpatient Hospital Stay (HOSPITAL_COMMUNITY): Payer: Medicare Other

## 2013-05-19 LAB — COMPREHENSIVE METABOLIC PANEL
ALT: 29 U/L (ref 0–35)
BUN: 5 mg/dL — ABNORMAL LOW (ref 6–23)
CO2: 26 mEq/L (ref 19–32)
Calcium: 8.4 mg/dL (ref 8.4–10.5)
Chloride: 102 mEq/L (ref 96–112)
Creatinine, Ser: 0.75 mg/dL (ref 0.50–1.10)
GFR calc Af Amer: >90 mL/min (ref >90)
GFR calc non Af Amer: >90 mL/min (ref >90)
Glucose, Bld: 85 mg/dL (ref 70–99)
Potassium: 3.8 mEq/L (ref 3.5–5.1)
Total Bilirubin: 4.7 mg/dL — ABNORMAL HIGH (ref 0.3–1.2)

## 2013-05-19 LAB — CBC
HCT: 25.5 % — ABNORMAL LOW (ref 36.0–46.0)
Hemoglobin: 8.7 g/dL — ABNORMAL LOW (ref 12.0–15.0)
MCH: 29.9 pg (ref 26.0–34.0)
MCV: 87.6 fL (ref 78.0–100.0)
RBC: 2.91 MIL/uL — ABNORMAL LOW (ref 3.87–5.11)
RDW: 15.7 % — ABNORMAL HIGH (ref 11.5–15.5)
WBC: 7.4 10*3/uL (ref 4.0–10.5)

## 2013-05-19 LAB — AMMONIA: Ammonia: 80 umol/L — ABNORMAL HIGH (ref 11–60)

## 2013-05-19 LAB — GLUCOSE, CAPILLARY: Glucose-Capillary: 80 mg/dL (ref 70–99)

## 2013-05-19 LAB — PROTIME-INR: Prothrombin Time: 22.5 seconds — ABNORMAL HIGH (ref 11.6–15.2)

## 2013-05-19 LAB — HEMOGLOBIN A1C: Mean Plasma Glucose: 91 mg/dL (ref <117)

## 2013-05-19 MED ORDER — PIPERACILLIN-TAZOBACTAM 3.375 G IVPB
3.3750 g | Freq: Three times a day (TID) | INTRAVENOUS | Status: DC
  Administered 2013-05-19 – 2013-05-20 (×3): 3.375 g via INTRAVENOUS
  Filled 2013-05-19 (×4): qty 50

## 2013-05-19 MED ORDER — TECHNETIUM TC 99M MEBROFENIN IV KIT
5.0000 | PACK | Freq: Once | INTRAVENOUS | Status: AC | PRN
  Administered 2013-05-19: 11:00:00 5 via INTRAVENOUS

## 2013-05-19 MED ORDER — PROPRANOLOL HCL ER 60 MG PO CP24
60.0000 mg | ORAL_CAPSULE | Freq: Every day | ORAL | Status: DC
  Administered 2013-05-19 – 2013-05-21 (×3): 60 mg via ORAL
  Filled 2013-05-19 (×3): qty 1

## 2013-05-19 MED ORDER — PHYTONADIONE 5 MG PO TABS
2.5000 mg | ORAL_TABLET | Freq: Once | ORAL | Status: AC
  Administered 2013-05-19: 14:00:00 2.5 mg via ORAL
  Filled 2013-05-19: qty 1

## 2013-05-19 MED ORDER — WHITE PETROLATUM GEL
Status: AC
  Administered 2013-05-19: 0.2
  Filled 2013-05-19: qty 5

## 2013-05-19 MED ORDER — OXYCODONE HCL 5 MG PO TABS
5.0000 mg | ORAL_TABLET | Freq: Three times a day (TID) | ORAL | Status: DC | PRN
  Administered 2013-05-19: 5 mg via ORAL
  Administered 2013-05-20 – 2013-05-21 (×2): 10 mg via ORAL
  Filled 2013-05-19: qty 1
  Filled 2013-05-19 (×2): qty 2

## 2013-05-19 NOTE — Progress Notes (Signed)
Family Medicine Teaching Service Attending Note  I interviewed and examined patient Colleen Olson and reviewed their tests and x-rays.  I discussed with Dr. Mikel Cella and reviewed their note for today.  I agree with their assessment and plan.     Additionally  Appreciate Surgery evaluation For HIDA scan today and on imperic antibiotics  Watch for encephalopathy - if no bowel movement and if ok with surgery would start lactulose - agree with check ammonia

## 2013-05-19 NOTE — Progress Notes (Signed)
Family Medicine Teaching Service Daily Progress Note Intern Pager: [phone]  Patient name: Colleen Olson Medical record number: [id number] Date of birth: 03-22-1961 Age: 52 y.o. Gender: female  Primary Care Provider: Rodman Pickle, MD Consultants: Surgery Code Status: FULL  Pt Overview and Major Events to Date:  05/17/13 - Admitted for rectal bleeding and abd pain 05/18/13 - Surgery consulted, recommend HIDA scan  Assessment and Plan: Colleen Olson is a 52 y.o. female presenting with acute RUQ abdominal pain and bloody stools. PMH is significant for Asthma, HTN, Borderline DM, HA, Arthritis, Chornic back pain, ETOH abuse, Hepatic cirrhosis and non-active hepatitis C   # Abdominal pain: Localized to the RUQ w/ CT scan w/ cholelithiasis concerning for possible cholecystitis. Murphy's sign positive. No WBC and afebrile but in significant pain. No other acute process noted. Not likely pancreatitis with normal lipase. Abd [place] suggestive of chronic cholecystitis. Alk phos still elevated. - IV Dialudid 1mg  Q3 PRN. Patient has chronic narcotic use, would prefer to transition to PO medication as soon as tolerated. - Con't IVF at NS 173ml/hr  - Consider surgery consult - CBC to monitor for WBC (Still normal today) - NPO for HIDA this morning. Advance diet after scan - Appreciate surgery's recommendations. Will await pharmacy consult for antibiotic dosing for presumed cholecystitis.   # Cirrhosis: H/o Hep C (although not active) and ETOH abuse. Last ETOH drink ~ 6 wks ago per pt. Scleral icterus is worse per pt. Diffuse mild itching likely from elevated bili which is chronic symptom for pt. No sign of overt worsening. Reports compliance on lasix and spiro. Dysphagia concerning for varices but acute onset makes less likely. Bruising likely from thrombocytopenia from poor hepatic fxn. Of note albumin 1.9, Alk PHos 164, T-Bili 4.4.  - Will start Propranolol for possible varices and HTN control  -  Cont home Spiro 100 / Lasix 40  - Will get ammonia level for newly progressive asterixis. Consider lactulose this afternoon, but must weigh the fact that she already has abd pain and blood stools. If she is dosed with lactulose, these may get worse.   # Res: h/o asthma. Intermittent SOB per pt when abdominal pain flairs. Also has chronic cough. No SOB otherwise is less concerning for acute respiratory process. Resp exam benign and CXR w/ possible atelectasis. Likely poor effort due to pain on deep inspiration in RUQ of abd. No O2 requirement  - CT Abd read as LLL consolidation, although I am unable to appreciate that on CXR or CT.  - Cough resolved at this time, per patient - O2 PRN (currently wearing for symptoms) - Cont Albuterol Q4 PRN  - Cont home flonase and claritin for upper airway congestion  - Tessalon perles PRN cough   # MSK: H/o chronic back pain on Oxy 10mg  TID as outpatient  - Continue oxy to 5mg  Q8 PRN for breakthrough (hold for HIDA)  - No tylenol due to cirrhosis   # Endocrine: h/o borderline DM. No previous A1c and not on any medications for hyperglycemia  - A1c pending  #CV: H/o HTN, BP improved - Continue home regimen - Add Propranolol today  # Heme: Anemia (at baseline) and thrombocytopenia (also at baseline). All likely of chronic disease and from hepatic cirrhosis. No need for transfusion.  - CBC in am  - INR elevated to 2.05 s/p Vit K 2.5mg  x1 yesterday. Will redose again once today.  # Bloody stool: x1 episode at admission with well formed stool and only small amount  of blood on toilet paper and red water in toilet. No diverticulosis on CT but cannot r/o. FOBT + in ED. Likely from internal hemorrhoids given lack of change in stool habit/nature, low blood volume, and only one occurance. Hgb stable at baseline (between 9-10). Pt not on any anticoaglation. Last colonoscopy in 2012. Report unavailable. Rectal exam w/o gross blood, small external skin tags, and w/o  fissure.  - CBC in AM  - Protonix    # Psych: Depression well controlled on home medications  - Continue Effexor   FEN/GI: Senna BID and Miralax daily. NPO for now, but advance diet after HIDA PPx: Heparin  Disposition: Pending overall clinical improvement  Subjective: Continues to complain of RUQ pain. She also is complaining of "dropping things" and her hands shaking, which is new for her. No cough at this time. She states she feels hungry.   Objective: Temp:  [98.3 F (36.8 C)-98.8 F (37.1 C)] 98.3 F (36.8 C) (10/19 0628) Pulse Rate:  [100-106] 100 (10/19 0628) Resp:  [18-20] 20 (10/19 0628) BP: (133-148)/(67-84) 148/74 mmHg (10/19 0628) SpO2:  [95 %-96 %] 95 % (10/19 0628) Weight:  [311 lb 8 oz (141.295 kg)] 311 lb 8 oz (141.295 kg) (10/19 1610)  Physical Exam: General: In bed, NAD. Awake, talking.  HEENT: Scleral icterus, MMM. [place] in place Cardiovascular: RRR Respiratory: Decreased effort secondary to abd pain. Anterior and lateral ascultation clear without wheeze  Abdomen: Obese, ascites of lower abdomen which is stable. TTP entire right side, most prominent RUQ. Unable to appreciate any hepatomegaly secondary to body habitus Extremities: 1+ edema R>L, tender to palpation. Obvious jerking of hands with extension of arms. Neuro: Mental status waxes/wanes, right now she is awake, alert and oriented.  Laboratory:  Recent Labs Lab 05/17/13 2101 05/18/13 0609 05/19/13 0500  WBC 9.1 7.8 7.4  HGB 9.3* 8.9* 8.7*  HCT 26.4* 26.4* 25.5*  PLT 101* 96* 103*    Recent Labs Lab 05/17/13 1023 05/17/13 2101 05/18/13 0609 05/19/13 0500  NA 136  --  137 137  K 3.3*  --  4.6 3.8  CL 102  --  106 102  CO2 25  --  25 26  BUN <3*  --  3* 5*  CREATININE 0.74 0.70 0.74 0.75  CALCIUM 8.5  --  8.2* 8.4  PROT 8.5*  --  8.5* 8.2  BILITOT 4.4*  --  4.6* 4.7*  ALKPHOS 164*  --  162* 146*  ALT 32  --  31 29  AST 83*  --  94* 79*  GLUCOSE 115*  --  89 85   Lipase  15  Imaging/Diagnostic Tests: Dg Chest 2 View  05/17/2013   CLINICAL DATA:  Rectal bleeding. Right abdominal pain.  EXAM: CHEST  2 VIEW  COMPARISON:  Chest radiograph 04/07/2013; chest CT 04/07/2013.  FINDINGS: Stable enlarged cardiac and mediastinal contours. Persistent elevation of the right hemidiaphragm. Left-greater-than-right basilar atelectasis. No large consolidative pulmonary opacity. No pleural effusion or pneumothorax.  IMPRESSION: 1. Cardiomegaly 2. Elevation of the right hemidiaphragm. Left basilar atelectasis.   Electronically Signed   By: Annia Belt M.D.   On: 05/17/2013 12:37   US Abdomen Complete  05/17/2013   CLINICAL DATA:  Right upper quadrant pain.  EXAM: ULTRASOUND ABDOMEN COMPLETE  COMPARISON:  CT earlier today.  FINDINGS: Gallbladder  Multiple gallstones within the gallbladder. Layering sludge also noted. The largest gallstone measures 2.6 cm. Gallbladder wall is borderline thickened at 3-4 mm. Negative sonographic Murphy's. No pericholecystic  fluid.  Common bile duct  Diameter: Normal caliber, 5 mm.  Liver  Increased echotexture throughout the liver compatible with fatty infiltration. No focal abnormality.  IVC  Not visualized due to overlying bowel gas.  Pancreas  Not visualized due to overlying bowel gas.  Spleen  Size and appearance within normal limits.  Right Kidney  Length: 10.8 cm. Echogenicity within normal limits. No mass or hydronephrosis visualized.  Left Kidney  Length: 11.0 cm. Echogenicity within normal limits. No mass or hydronephrosis visualized.  Abdominal aorta  No aneurysm visualized.  IMPRESSION: Cholelithiasis. Borderline gallbladder wall thickening, likely related to chronic cholecystitis. No definite sonographic evidence of acute cholecystitis.  Fatty liver.   Electronically Signed   By: Charlett Nose M.D.   On: 05/17/2013 17:15   Ct Abdomen Pelvis W Contrast  05/17/2013   CLINICAL DATA:  Bright red blood in stool. Burning in the abdomen. Nonproductive  cough.  EXAM: CT ABDOMEN AND PELVIS WITH CONTRAST  TECHNIQUE: Multidetector CT imaging of the abdomen and pelvis was performed using the standard protocol following bolus administration of intravenous contrast.  CONTRAST:  OMNIPAQUE IOHEXOL 300 MG/ML  SOLN  COMPARISON:  04/19/2013  FINDINGS: Motion artifact severely limits the study.  There is consolidation in the lateral basal segment of the left lower lobe. Subsegmental atelectasis at the right base.  The liver has a cirrhotic appearance. There is a small amount of free fluid surrounding the liver. There is a persistent ill-defined mass measuring 1.6 cm in the medial segment of the left lobe of the liver on image 17 of series 2.  Calculus in the right kidney on image 24 is stable.  Gallstones are noted. There is gallbladder wall thickening.  Spleen and pancreas are grossly within normal limits.  There is stranding along the right pericolic gutter as well as an ill-defined appearance of the wall of the ascending colon. The appendix has a normal appearance.  No disproportionate dilatation of small bowel to suggest obstruction.  Bladder, uterus, and adnexa are grossly within normal limits.  There is a small amount of free fluid layering in the pelvis.  Stranding throughout the subcutaneous fat of the abdomen and back is noted compatible with diffuse spotty edema.  IMPRESSION: Gallstones. There is evidence of gallbladder wall thickening and inflammatory change. Acute cholecystitis is not excluded.  Cirrhotic liver.  Persistent small mass in the medial segment of the left lobe of the liver. Small hepatoma is not excluded.  Free fluid around the liver, in the right pericolic gutter, and in the pelvis.  There is inflammatory change along the ascending colon and possible inflammatory change of the wall of the ascending colon. No evidence of acute appendicitis. None inflammatory process is not excluded.  Left lower lobe consolidation in the lungs.   Electronically  Signed   By: Maryclare Bean M.D.   On: 05/17/2013 15:42    Lemya Greenwell Nydia Bouton, MD 05/19/2013, 7:22 AM PGY-3, [facility] Family Medicine FPTS Intern pager: [phone], text pages welcome

## 2013-05-19 NOTE — Progress Notes (Signed)
Subjective: Pt still c/o pain in RUQ, denies distension.  No N/V.  Pending HIDA scan.  Objective: Vital signs in last 24 hours: Temp:  [98.3 F (36.8 C)-98.8 F (37.1 C)] 98.3 F (36.8 C) (10/19 0628) Pulse Rate:  [100-106] 100 (10/19 0628) Resp:  [18-20] 20 (10/19 0628) BP: (133-148)/(67-84) 148/74 mmHg (10/19 0628) SpO2:  [95 %-96 %] 95 % (10/19 0628) Weight:  [311 lb 8 oz (141.295 kg)] 311 lb 8 oz (141.295 kg) (10/19 0628) Last BM Date: 06/16/13  Intake/Output from previous day: 10/18 0701 - 10/19 0700 In: 3120 [P.O.:120; I.V.:3000] Out: 3200 [Urine:3200] Intake/Output this shift:    PE: Gen:  Alert, NAD, pleasant Abd: largely obese, soft, moderate tenderness in the RUQ, ND, +BS  Lab Results:   Recent Labs  05/18/13 0609 05/19/13 0500  WBC 7.8 7.4  HGB 8.9* 8.7*  HCT 26.4* 25.5*  PLT 96* 103*   BMET  Recent Labs  05/18/13 0609 05/19/13 0500  NA 137 137  K 4.6 3.8  CL 106 102  CO2 25 26  GLUCOSE 89 85  BUN 3* 5*  CREATININE 0.74 0.75  CALCIUM 8.2* 8.4   PT/INR  Recent Labs  05/17/13 2101 05/19/13 0500  LABPROT 23.6* 22.5*  INR 2.18* 2.05*   CMP     Component Value Date/Time   NA 137 05/19/2013 0500   K 3.8 05/19/2013 0500   CL 102 05/19/2013 0500   CO2 26 05/19/2013 0500   GLUCOSE 85 05/19/2013 0500   BUN 5* 05/19/2013 0500   CREATININE 0.75 05/19/2013 0500   CREATININE 0.75 04/17/2013 0955   CALCIUM 8.4 05/19/2013 0500   PROT 8.2 05/19/2013 0500   ALBUMIN 1.9* 05/19/2013 0500   AST 79* 05/19/2013 0500   ALT 29 05/19/2013 0500   ALKPHOS 146* 05/19/2013 0500   BILITOT 4.7* 05/19/2013 0500   GFRNONAA >90 05/19/2013 0500   GFRAA >90 05/19/2013 0500   Lipase     Component Value Date/Time   LIPASE 15 05/17/2013 2101       Studies/Results: Dg Chest 2 View  05/17/2013   CLINICAL DATA:  Rectal bleeding. Right abdominal pain.  EXAM: CHEST  2 VIEW  COMPARISON:  Chest radiograph 04/07/2013; chest CT 04/07/2013.  FINDINGS: Stable  enlarged cardiac and mediastinal contours. Persistent elevation of the right hemidiaphragm. Left-greater-than-right basilar atelectasis. No large consolidative pulmonary opacity. No pleural effusion or pneumothorax.  IMPRESSION: 1. Cardiomegaly 2. Elevation of the right hemidiaphragm. Left basilar atelectasis.   Electronically Signed   By: Annia Belt M.D.   On: 05/17/2013 12:37   US Abdomen Complete  05/17/2013   CLINICAL DATA:  Right upper quadrant pain.  EXAM: ULTRASOUND ABDOMEN COMPLETE  COMPARISON:  CT earlier today.  FINDINGS: Gallbladder  Multiple gallstones within the gallbladder. Layering sludge also noted. The largest gallstone measures 2.6 cm. Gallbladder wall is borderline thickened at 3-4 mm. Negative sonographic Murphy's. No pericholecystic fluid.  Common bile duct  Diameter: Normal caliber, 5 mm.  Liver  Increased echotexture throughout the liver compatible with fatty infiltration. No focal abnormality.  IVC  Not visualized due to overlying bowel gas.  Pancreas  Not visualized due to overlying bowel gas.  Spleen  Size and appearance within normal limits.  Right Kidney  Length: 10.8 cm. Echogenicity within normal limits. No mass or hydronephrosis visualized.  Left Kidney  Length: 11.0 cm. Echogenicity within normal limits. No mass or hydronephrosis visualized.  Abdominal aorta  No aneurysm visualized.  IMPRESSION: Cholelithiasis. Borderline gallbladder wall thickening,  likely related to chronic cholecystitis. No definite sonographic evidence of acute cholecystitis.  Fatty liver.   Electronically Signed   By: Charlett Nose M.D.   On: 05/17/2013 17:15   Ct Abdomen Pelvis W Contrast  05/17/2013   CLINICAL DATA:  Bright red blood in stool. Burning in the abdomen. Nonproductive cough.  EXAM: CT ABDOMEN AND PELVIS WITH CONTRAST  TECHNIQUE: Multidetector CT imaging of the abdomen and pelvis was performed using the standard protocol following bolus administration of intravenous contrast.  CONTRAST:   OMNIPAQUE IOHEXOL 300 MG/ML  SOLN  COMPARISON:  04/19/2013  FINDINGS: Motion artifact severely limits the study.  There is consolidation in the lateral basal segment of the left lower lobe. Subsegmental atelectasis at the right base.  The liver has a cirrhotic appearance. There is a small amount of free fluid surrounding the liver. There is a persistent ill-defined mass measuring 1.6 cm in the medial segment of the left lobe of the liver on image 17 of series 2.  Calculus in the right kidney on image 24 is stable.  Gallstones are noted. There is gallbladder wall thickening.  Spleen and pancreas are grossly within normal limits.  There is stranding along the right pericolic gutter as well as an ill-defined appearance of the wall of the ascending colon. The appendix has a normal appearance.  No disproportionate dilatation of small bowel to suggest obstruction.  Bladder, uterus, and adnexa are grossly within normal limits.  There is a small amount of free fluid layering in the pelvis.  Stranding throughout the subcutaneous fat of the abdomen and back is noted compatible with diffuse spotty edema.  IMPRESSION: Gallstones. There is evidence of gallbladder wall thickening and inflammatory change. Acute cholecystitis is not excluded.  Cirrhotic liver.  Persistent small mass in the medial segment of the left lobe of the liver. Small hepatoma is not excluded.  Free fluid around the liver, in the right pericolic gutter, and in the pelvis.  There is inflammatory change along the ascending colon and possible inflammatory change of the wall of the ascending colon. No evidence of acute appendicitis. None inflammatory process is not excluded.  Left lower lobe consolidation in the lungs.   Electronically Signed   By: Maryclare Bean M.D.   On: 05/17/2013 15:42    Anti-infectives: Anti-infectives   None       Assessment/Plan RUQ Abdominal pain  Cholelithiasis, ?cholecystits vs ascites and liver disease  H/o Hepatitis C  with Cirrhosis Childs 3  ETOH abuse  Hyperbilirubinemia  Rectal bleeding   Plan:  1. The patient is a very poor surgical candidate with a risk likely much >50% for complications and/or death  2. Would obtain a HIDA scan to determine if true cystic duct obstruction. If positive would recommend perc chole tube, but very likely to be solely from ascites/cirrhosis of liver and not true gallbladder disease  3. Would not recommend surgical intervention in this patient at this time  4. Will follow up with HIDA scan results, NPO AND NO PAIN MEDS 6HR PRIOR TO HIDA SCAN 5. Antibiotics (Unasyn, cipro, or zosyn?), will ask pharmacy to help with dosing/medication given significant cirrhosis    LOS: 2 days    DORT, Vendetta Pittinger 05/19/2013, 7:34 AM Pager: [phone]

## 2013-05-19 NOTE — Progress Notes (Signed)
ANTIBIOTIC CONSULT NOTE - INITIAL  Pharmacy Consult for Zosyn Indication: Possible Cholecystitis  No Known Allergies  Patient Measurements: Height: 5\' 8"  (172.7 cm) Weight: 311 lb 8 oz (141.295 kg) IBW/kg (Calculated) : 63.9   Vital Signs: Temp: 98.3 F (36.8 C) (10/19 0628) Temp src: Oral (10/19 0628) BP: 148/74 mmHg (10/19 0628) Pulse Rate: 100 (10/19 0628) Intake/Output from previous day: 10/18 0701 - 10/19 0700 In: 3120 [P.O.:120; I.V.:3000] Out: 3200 [Urine:3200] Intake/Output from this shift:    Labs:  Recent Labs  05/17/13 2101 05/18/13 0609 05/19/13 0500  WBC 9.1 7.8 7.4  HGB 9.3* 8.9* 8.7*  PLT 101* 96* 103*  CREATININE 0.70 0.74 0.75   Estimated Creatinine Clearance: 120.4 ml/min (by C-G formula based on Cr of 0.75). No results found for this basename: VANCOTROUGH, VANCOPEAK, VANCORANDOM, GENTTROUGH, GENTPEAK, GENTRANDOM, TOBRATROUGH, TOBRAPEAK, TOBRARND, AMIKACINPEAK, AMIKACINTROU, AMIKACIN,  in the last 72 hours   Microbiology: No results found for this or any previous visit (from the past 720 hour(s)).  Medical History: Past Medical History  Diagnosis Date  . Asthma   . Hypertension   . Angina   . Osteoarthritis of both knees   . Exertional dyspnea 12/01/11  . Borderline diabetes   . Headache(784.0) 12/02/11    "sometimes when I wake up in am; real bad"  . Arthritis   . Chronic lower back pain   . Tachycardia     Medications:  Scheduled:  . fluticasone  2 spray Each Nare Daily  . furosemide  40 mg Oral Daily  . heparin  5,000 Units Subcutaneous Q8H  . hydrochlorothiazide  25 mg Oral Daily  . loratadine  10 mg Oral Daily  . polyethylene glycol  17 g Oral Daily  . senna  1 tablet Oral BID  . spironolactone  100 mg Oral Daily  . venlafaxine  37.5 mg Oral BID   Assessment: 52 y.o. female presented with RUQ abdominal pain for 3 days and noted bloody stools. Pt has RUQ pain chronically but this is significantly worse. Pt was recently  diagnosed with liver cirrhosis. Normally when she has abdominal pain she "drinks a beer and it goes away". States last ETOH use on 04/07/13, and drinks 3-5 40oz beers per day prior to 04/07/13.    Pt is started on empiric ABX coverage for possible cholecystitis per pharmacy. Pt is currently afebrile and WBC wnl.   Goal of Therapy:  Resolution of possible infection/cholecystitis  Plan:  1. Start Zosyn 3.375 mg IV Q8h 2. Monitor renal fxn, WBC, and temp  Anabel Bene 05/19/2013,9:46 AM

## 2013-05-19 NOTE — Progress Notes (Signed)
I have seen and examined the patient and agree with the assessment and plans.  Ossie Yebra A. Arla Boutwell  MD, FACS  

## 2013-05-20 DIAGNOSIS — B1920 Unspecified viral hepatitis C without hepatic coma: Secondary | ICD-10-CM

## 2013-05-20 DIAGNOSIS — K7030 Alcoholic cirrhosis of liver without ascites: Secondary | ICD-10-CM

## 2013-05-20 DIAGNOSIS — I10 Essential (primary) hypertension: Secondary | ICD-10-CM

## 2013-05-20 DIAGNOSIS — R0609 Other forms of dyspnea: Secondary | ICD-10-CM

## 2013-05-20 DIAGNOSIS — R748 Abnormal levels of other serum enzymes: Secondary | ICD-10-CM

## 2013-05-20 DIAGNOSIS — E876 Hypokalemia: Secondary | ICD-10-CM

## 2013-05-20 DIAGNOSIS — Z9181 History of falling: Secondary | ICD-10-CM

## 2013-05-20 DIAGNOSIS — F1020 Alcohol dependence, uncomplicated: Secondary | ICD-10-CM

## 2013-05-20 DIAGNOSIS — E871 Hypo-osmolality and hyponatremia: Secondary | ICD-10-CM

## 2013-05-20 DIAGNOSIS — R17 Unspecified jaundice: Secondary | ICD-10-CM

## 2013-05-20 DIAGNOSIS — R609 Edema, unspecified: Secondary | ICD-10-CM

## 2013-05-20 LAB — PROTIME-INR
INR: 2.09 — ABNORMAL HIGH (ref 0.00–1.49)
Prothrombin Time: 22.8 seconds — ABNORMAL HIGH (ref 11.6–15.2)

## 2013-05-20 LAB — CBC
HCT: 26.5 % — ABNORMAL LOW (ref 36.0–46.0)
MCH: 29.7 pg (ref 26.0–34.0)
MCHC: 33.6 g/dL (ref 30.0–36.0)
MCV: 88.3 fL (ref 78.0–100.0)
Platelets: 96 10*3/uL — ABNORMAL LOW (ref 150–400)
RBC: 3 MIL/uL — ABNORMAL LOW (ref 3.87–5.11)
RDW: 15.8 % — ABNORMAL HIGH (ref 11.5–15.5)

## 2013-05-20 LAB — COMPREHENSIVE METABOLIC PANEL
Albumin: 1.8 g/dL — ABNORMAL LOW (ref 3.5–5.2)
BUN: 6 mg/dL (ref 6–23)
Calcium: 8.5 mg/dL (ref 8.4–10.5)
Chloride: 97 mEq/L (ref 96–112)
Creatinine, Ser: 0.75 mg/dL (ref 0.50–1.10)
Total Bilirubin: 4.5 mg/dL — ABNORMAL HIGH (ref 0.3–1.2)
Total Protein: 8.3 g/dL (ref 6.0–8.3)

## 2013-05-20 MED ORDER — FUROSEMIDE 10 MG/ML IJ SOLN
80.0000 mg | Freq: Once | INTRAMUSCULAR | Status: AC
  Administered 2013-05-20: 18:00:00 80 mg via INTRAVENOUS
  Filled 2013-05-20: qty 8

## 2013-05-20 MED ORDER — LACTULOSE 10 GM/15ML PO SOLN
20.0000 g | Freq: Two times a day (BID) | ORAL | Status: DC
  Administered 2013-05-20: 20 g via ORAL
  Filled 2013-05-20 (×4): qty 30

## 2013-05-20 NOTE — Progress Notes (Signed)
Patient ID: Colleen Olson, female   DOB: Nov 03, 1960, 52 y.o.   MRN: [id number]    Subjective: Pt is sedated from meds.  Says her abdomen hurts some.  No other c/o  Objective: Vital signs in last 24 hours: Temp:  [97.6 F (36.4 C)-98.5 F (36.9 C)] 97.8 F (36.6 C) (10/20 0542) Pulse Rate:  [70-101] 70 (10/20 0542) Resp:  [18-20] 18 (10/20 0542) BP: (111-125)/(64-73) 111/64 mmHg (10/20 0542) SpO2:  [95 %-98 %] 98 % (10/20 0542) Last BM Date: 05/19/13 (scant)  Intake/Output from previous day: 10/19 0701 - 10/20 0700 In: 390 [P.O.:240; IV Piggyback:150] Out: 450 [Urine:450] Intake/Output this shift:    PE: Abd: soft, mildly tender in the epigastrium, +BS, obese Heart: regular Lungs: CTAB  Lab Results:   Recent Labs  05/19/13 0500 05/20/13 0500  WBC 7.4 7.1  HGB 8.7* 8.9*  HCT 25.5* 26.5*  PLT 103* 96*   BMET  Recent Labs  05/19/13 0500 05/20/13 0500  NA 137 131*  K 3.8 3.4*  CL 102 97  CO2 26 29  GLUCOSE 85 85  BUN 5* 6  CREATININE 0.75 0.75  CALCIUM 8.4 8.5   PT/INR  Recent Labs  05/19/13 0500 05/20/13 0500  LABPROT 22.5* 22.8*  INR 2.05* 2.09*   CMP     Component Value Date/Time   NA 131* 05/20/2013 0500   K 3.4* 05/20/2013 0500   CL 97 05/20/2013 0500   CO2 29 05/20/2013 0500   GLUCOSE 85 05/20/2013 0500   BUN 6 05/20/2013 0500   CREATININE 0.75 05/20/2013 0500   CREATININE 0.75 04/17/2013 0955   CALCIUM 8.5 05/20/2013 0500   PROT 8.3 05/20/2013 0500   ALBUMIN 1.8* 05/20/2013 0500   AST 78* 05/20/2013 0500   ALT 28 05/20/2013 0500   ALKPHOS 143* 05/20/2013 0500   BILITOT 4.5* 05/20/2013 0500   GFRNONAA >90 05/20/2013 0500   GFRAA >90 05/20/2013 0500   Lipase     Component Value Date/Time   LIPASE 15 05/17/2013 2101       Studies/Results: Nm Hepatobiliary  05/19/2013   CLINICAL DATA:  Right upper quadrant abdominal pain. Chololithiasis.  EXAM: NUCLEAR MEDICINE HEPATOBILIARY IMAGING  TECHNIQUE: Sequential images of the  abdomen were obtained out to 60 minutes following intravenous administration of radiopharmaceutical.  COMPARISON:  Ultrasound 05/17/2013.  RADIOPHARMACEUTICALS:  5.17mCi Tc-68m Choletec  FINDINGS: There is symmetric uptake in the liver and prompt excretion into the biliary tree. Gallbladder is visualized at 25 min. Activity is seen in the small bowel at 100 min.  IMPRESSION: Normal biliary patency study.   Electronically Signed   By: Loralie Champagne M.D.   On: 05/19/2013 12:26    Anti-infectives: Anti-infectives   Start     Dose/Rate Route Frequency Ordered Stop   05/19/13 1400  piperacillin-tazobactam (ZOSYN) IVPB 3.375 g  Status:  Discontinued     3.375 g 12.5 mL/hr over 240 Minutes Intravenous 3 times per day 05/19/13 1025 05/20/13 0744       Assessment/Plan  1. Child's C cirrhotic 2. Abdominal pain 3. Cholelithiasis Patient Active Problem List   Diagnosis Date Noted  . Frequent falls 05/06/2013  . Depression 05/06/2013  . Hepatitis C 04/17/2013  . Dyspnea 04/08/2013  . Hypokalemia 04/08/2013  . Elevated liver enzymes 04/07/2013  . Elevated alkaline phosphatase level 04/07/2013  . Hyponatremia 04/07/2013  . Alcohol dependence 04/07/2013  . Elevated bilirubin 04/07/2013  . Edema of both legs 04/07/2013  . Sinusitis, chronic 02/28/2013  . Tachycardia 12/03/2011  .  Hot flashes, menopausal 10/14/2010  . Morbid obesity 07/22/2010  . ALLERGIC RHINITIS 07/22/2010  . DEGENERATIVE JOINT DISEASE, BOTH KNEES, SEVERE 03/05/2010  . Essential hypertension, benign 08/31/2009  . OSTEOPENIA 08/24/2009  . ASTHMA, INTERMITTENT 08/17/2009  . BACK PAIN, CHRONIC 08/17/2009   Plan: 1. Tthe patient's HIDA scan shows a normal gallbladder, without evidence of cholecystitis.  No surgical intervention or drainage is warranted.  I have stopped her zosyn as she has no fever, WBC, or evidence of cholecystitis. 2. Suspect pain is secondary to cirrhosis, defer further pain control to primary team.  Will  sign off.   LOS: 3 days    Dennison Mcdaid E 05/20/2013, 8:03 AM Pager: [phone]

## 2013-05-20 NOTE — Progress Notes (Signed)
Pt. BP this am is 107/64, HR 73, scheduled to receive Hydrochlorothiazide 25 mg & Propranolol 60 mg.  Paged on call MD at [phone].  Return call received from Dr. Jarvis Newcomer & informed of pt. BP this am.  Stated was ok to given scheduled medications as ordered.  Will continue to monitor.  Forbes Cellar, RN

## 2013-05-20 NOTE — Evaluation (Signed)
Physical Therapy Evaluation Patient Details Name: Colleen Olson MRN: [id number] DOB: 09/22/1960 Today's Date: 05/20/2013 Time: 4098-1191 PT Time Calculation (min): 18 min  PT Assessment / Plan / Recommendation History of Present Illness  Pt is a 52 yo female admitted with abdominal pain and long history of alcohol abuse with cirrhosis of the liver.  Pt also with non active Hep C and chronic back pain.  Pt in for abdominal pain and rectal bleeding.   Clinical Impression  Pt required max encouragement to mobilize.  Pt very vocal in voicing her pain.  Needs skilled PT to maximize I and safety so pt can eventually return to her daughter's home. At current functional level will need ST-SNF prior to return home.    PT Assessment  Patient needs continued PT services    Follow Up Recommendations  SNF    Does the patient have the potential to tolerate intense rehabilitation      Barriers to Discharge        Equipment Recommendations  None recommended by PT    Recommendations for Other Services     Frequency Min 3X/week    Precautions / Restrictions Precautions Precautions: Fall Restrictions Weight Bearing Restrictions: No   Pertinent Vitals/Pain SaO2 75% on RA after amb. O2 replaces and SaO2 up to 85%.  Nursing aware.      Mobility  Bed Mobility Bed Mobility: Sit to Supine;Scooting to HOB Sit to Supine: 4: Min assist;HOB flat Scooting to HOB: 5: Set up Details for Bed Mobility Assistance: Assist to bring legs up into bed. Transfers Transfers: Sit to Stand;Stand to Sit Sit to Stand: 1: +2 Total assist;With upper extremity assist;With armrests;From chair/3-in-1 Sit to Stand: Patient Percentage: 80% Stand to Sit: With upper extremity assist;4: Min assist;To bed Details for Transfer Assistance: pt required cues for hand placement and safety. Pt mildly impulsive during transfers just focused on returning bed ASAP. Ambulation/Gait Ambulation/Gait Assistance: 1: +2 Total  assist Ambulation/Gait: Patient Percentage: 80% Ambulation Distance (Feet): 10 Feet Assistive device: Rolling walker Ambulation/Gait Assistance Details: Knees "giving way" x 2 during amb. Gait Pattern: Step-to pattern;Decreased step length - right;Decreased step length - left;Shuffle Gait velocity: decr    Exercises     PT Diagnosis: Difficulty walking;Generalized weakness;Acute pain  PT Problem List: Decreased strength;Decreased activity tolerance;Decreased mobility;Decreased knowledge of use of DME PT Treatment Interventions: DME instruction;Gait training;Functional mobility training;Therapeutic activities;Therapeutic exercise;Patient/family education     PT Goals(Current goals can be found in the care plan section) Acute Rehab PT Goals Patient Stated Goal: to get rid of this pain. PT Goal Formulation: With patient Time For Goal Achievement: 05/27/13 Potential to Achieve Goals: Good  Visit Information  Last PT Received On: 05/20/13 Assistance Needed: +1 PT/OT Co-Evaluation/Treatment: Yes History of Present Illness: Pt is a 52 yo female admitted with abdominal pain and long history of alcohol abuse with cirrhosis of the liver.  Pt also with non active Hep C and chronic back pain.  Pt in for abdominal pain and rectal bleeding.        Prior Functioning  Home Living Family/patient expects to be discharged to:: Private residence Living Arrangements: Children;Other relatives Available Help at Discharge: Family;Available 24 hours/day Type of Home: House Home Access: Stairs to enter [employer] of Steps: 2 Entrance Stairs-Rails: None Home Layout: Two level Alternate Level Stairs-Number of Steps: 12 Alternate Level Stairs-Rails: Left Home Equipment: Walker - 2 wheels;Cane - single point;Bedside commode;Shower seat Prior Function Level of Independence: Independent with assistive device(s) Comments: states she  uses cane or walker to get around  house Communication Communication: No difficulties Dominant Hand: Left    Cognition  Cognition Arousal/Alertness: Awake/alert Behavior During Therapy: Restless Overall Cognitive Status: Within Functional Limits for tasks assessed    Extremity/Trunk Assessment Upper Extremity Assessment Upper Extremity Assessment: Defer to OT evaluation Lower Extremity Assessment Lower Extremity Assessment: Generalized weakness   Balance Balance Balance Assessed: Yes Static Standing Balance Static Standing - Balance Support: Bilateral upper extremity supported Static Standing - Level of Assistance: 4: Min assist  End of Session PT - End of Session Activity Tolerance: Patient limited by pain Patient left: in bed;with call bell/phone within reach;with bed alarm set Nurse Communication: Mobility status  GP     [facility] 05/20/2013, 12:39 PM  [facility] PT [phone]

## 2013-05-20 NOTE — Progress Notes (Signed)
Utilization review completed.  

## 2013-05-20 NOTE — Plan of Care (Signed)
Problem: Phase I Progression Outcomes Goal: OOB as tolerated unless otherwise ordered Outcome: Completed/Met Date Met:  05/20/13 OOB to chair this am, PT/OT eval orders

## 2013-05-20 NOTE — Progress Notes (Signed)
FMTS Attending Daily Note:  Renold Don MD  [phone] pager  Family Practice pager:  [phone] I have seen and examined this patient and have reviewed their chart. I have discussed this patient with the resident. I agree with the resident's findings, assessment and care plan.  Additionally:  Still somewhat altered.  Agree with Lactulose as likely hepatic encephalopathy. Grossly edematous (+4 to knees) on examination.  Stopped IVF, extra dose of Lasix today as likely iatrogenic fluid overload on top of ESLD.  Continue other diuretics. Trend Hgb.  No gross bleeding noted, FOB positive.    Tobey Grim, MD 05/20/2013 4:12 PM

## 2013-05-20 NOTE — Evaluation (Signed)
Occupational Therapy Evaluation Patient Details Name: Colleen Olson MRN: [id number] DOB: [birth year] Today's Date: 05/20/2013 Time: 4098-1191 OT Time Calculation (min): 18 min  OT Assessment / Plan / Recommendation History of present illness Pt is a 52 yo female admitted with abdominal pain and long history of alcohol abuse with cirrhosis of the liver.  Pt also with non active Hep C and chronic back pain.  Pt in for abdominal pain and rectal bleeding.    Clinical Impression   Pt admitted with above diagnosis and has the deficits listed below. Pt would benefit from further OT to attempt to increase pt's I with all her adls and attempt to engage pt in more of her own self care so she can eventually return home with her family.    OT Assessment  Patient needs continued OT Services    Follow Up Recommendations  SNF;Supervision/Assistance - 24 hour    Barriers to Discharge Inaccessible home environment Pt's bedroom on second floor with 12 steps up.  Rec pt see if she has option to stay on main floor.  Equipment Recommendations  None recommended by OT    Recommendations for Other Services    Frequency  Min 2X/week    Precautions / Restrictions Precautions Precautions: Fall Restrictions Weight Bearing Restrictions: No   Pertinent Vitals/Pain O2 was off when therapy entered room.  Pt returned to bed and O2 sat was 75%.  2L of O2 replaced and pt returned to 85%.  Nursing notified.  Pt rated pain in abdomen as 10/10.    ADL  Eating/Feeding: Simulated;Set up;Other (comment) (reports some trouble with hands shaking.  need to fully eval) Where Assessed - Eating/Feeding: Chair Grooming: Simulated;Set up Where Assessed - Grooming: Unsupported sitting Upper Body Bathing: Simulated;Minimal assistance Where Assessed - Upper Body Bathing: Unsupported sitting Lower Body Bathing: Performed;Maximal assistance Where Assessed - Lower Body Bathing: Supported sit to stand Upper Body Dressing:  Simulated;Minimal assistance Where Assessed - Upper Body Dressing: Supported sitting Lower Body Dressing: Performed;Maximal assistance Where Assessed - Lower Body Dressing: Supported sit to Pharmacist, hospital: Performed;Moderate assistance Toilet Transfer Method: Stand pivot Toilet Transfer Equipment: Bedside commode Toileting - Clothing Manipulation and Hygiene: Simulated;Minimal assistance Where Assessed - Engineer, mining and Hygiene: Standing Equipment Used: Rolling walker Transfers/Ambulation Related to ADLs: Pt walked around bed in room to get back to her bed.  Pt encouraged to walk further but chose to return to bed instead due to pain. ADL Comments: pt did not even want to attempt LE adls due to pain.  UE adls are only limted by general weakness and poor endurance. Pt may need AE to address LE adls.      OT Diagnosis: Generalized weakness;Acute pain  OT Problem List: Decreased strength;Decreased activity tolerance;Impaired balance (sitting and/or standing);Decreased safety awareness;Decreased knowledge of use of DME or AE;Pain OT Treatment Interventions: Self-care/ADL training;Therapeutic activities   OT Goals(Current goals can be found in the care plan section) Acute Rehab OT Goals Patient Stated Goal: to get rid of this pain. OT Goal Formulation: With patient Time For Goal Achievement: 06/03/13 Potential to Achieve Goals: Fair ADL Goals Pt Will Perform Eating: with set-up;sitting Pt Will Perform Grooming: with set-up;standing Pt Will Perform Lower Body Bathing: with supervision;with adaptive equipment;sit to/from stand Pt Will Perform Lower Body Dressing: with supervision;with adaptive equipment;sit to/from stand Pt Will Perform Tub/Shower Transfer: with supervision;tub bench;rolling walker;ambulating Additional ADL Goal #1: Pt will complete all toileting with 3:1 over commode with S.  Visit Information  Last OT  Received On: 05/20/13 Assistance Needed:  +1 PT/OT Co-Evaluation/Treatment: Yes History of Present Illness: Pt is a 52 yo female admitted with abdominal pain and long history of alcohol abuse with cirrhosis of the liver.  Pt also with non active Hep C and chronic back pain.  Pt in for abdominal pain and rectal bleeding.        Prior Functioning     Home Living Family/patient expects to be discharged to:: Private residence Living Arrangements: Children;Other relatives Available Help at Discharge: Family;Available 24 hours/day Type of Home: House Home Access: Stairs to enter [employer] of Steps: 2 Entrance Stairs-Rails: None Home Layout: Two level Alternate Level Stairs-Number of Steps: 12 Alternate Level Stairs-Rails: Left Home Equipment: Walker - 2 wheels;Cane - single point;Bedside commode;Shower seat Prior Function Level of Independence: Independent with assistive device(s) Comments: states she uses cane or walker to get around house Communication Communication: No difficulties Dominant Hand: Left         Vision/Perception Vision - History Baseline Vision: No visual deficits Patient Visual Report: No change from baseline Vision - Assessment Vision Assessment: Vision not tested   Cognition  Cognition Arousal/Alertness: Awake/alert Behavior During Therapy: Restless Overall Cognitive Status: Within Functional Limits for tasks assessed    Extremity/Trunk Assessment Upper Extremity Assessment Upper Extremity Assessment: Defer to OT evaluation Lower Extremity Assessment Lower Extremity Assessment: Generalized weakness     Mobility Bed Mobility Bed Mobility: Sit to Supine;Scooting to HOB Sit to Supine: 4: Min assist;HOB flat Scooting to HOB: 5: Set up Details for Bed Mobility Assistance: Assist to bring legs up into bed. Transfers Transfers: Sit to Stand;Stand to Sit Sit to Stand: 3: Mod assist;From chair/3-in-1;Without upper extremity assist Stand to Sit: 4: Min assist;To bed;With  armrests;With upper extremity assist Details for Transfer Assistance: pt required cues for hand placement and safety. Pt mildly impulsive during transfers just focused on returning bed ASAP.     Exercise     Balance     End of Session OT - End of Session Equipment Utilized During Treatment: Rolling walker Activity Tolerance: Patient limited by fatigue;Patient limited by pain Patient left: in bed;with call bell/phone within reach Nurse Communication: Mobility status;Other (comment) (need for SNF)  GO     Hope Budds 05/20/2013, 12:34 PM [phone]

## 2013-05-20 NOTE — Progress Notes (Addendum)
Family Medicine Teaching Service Daily Progress Note Intern Pager: [phone]  Patient name: Colleen Olson Medical record number: [id number] Date of birth: [birth year] Age: 52 y.o. Gender: female  Primary Care Provider: Rodman Pickle, MD Consultants: Surgery (signed off 10/20) Code Status: Partial Code. DNI  Pt Overview and Major Events to Date:  05/17/13 - Admitted for rectal bleeding and abd pain  05/18/13 - Surgery consulted, recommend HIDA scan 05/19/13 - HIDA with normal gallbladder and no signs of cholecystitis.  Assessment and Plan: Colleen Olson is a 52 y.o. female presenting with acute RUQ abdominal pain and bloody stools. PMH is significant for Asthma, HTN, Borderline DM, HA, Arthritis, Chornic back pain, ETOH abuse, Hepatic cirrhosis and non-active hepatitis C   # Abdominal pain: Localized to the RUQ w/ CT scan w/ cholelithiasis concerning for possible cholecystitis. Murphy's sign positive. No WBC and afebrile but in significant pain. No other acute process noted. Not likely pancreatitis with normal lipase. Abd [place] suggestive of chronic cholecystitis. HIDA scan with normal gallbladder and no cholecystitis.   - PO Oxycodone IR 5-10mg  q8h prn - D/c IVF as patient tolerating PO  - Appreciate surgery's recommendations. No surgical need at this time.  No cholecystitis, abx d/c'd. - PT/OT to eval today.  # Cirrhosis: H/o Hep C (although not active) and ETOH abuse. Last ETOH drink ~ 6 wks ago per pt. Scleral icterus is worse per pt. Diffuse mild itching likely from elevated bili which is chronic symptom for pt. No sign of overt worsening. Reports compliance on lasix and spiro. Dysphagia concerning for varices but acute onset makes less likely. Bruising likely from thrombocytopenia from poor hepatic fxn.  MELD score: 20 (25% 90 day mortality), Child-pugh score: 11 (Child class C, Life expectancy 1-3 years). New-onset, progressive asterixis. AMS 10/20.  Ammonia level 80 - Continue  Propranolol for possible varices and HTN control  - Continue home Spiro 100 / Lasix 40  - Start lactulose.   # Res: h/o asthma. Intermittent SOB per pt when abdominal pain flairs. Also has chronic cough. No SOB otherwise is less concerning for acute respiratory process. Resp exam benign and CXR w/ possible atelectasis. Likely poor effort due to pain on deep inspiration in RUQ of abd. No O2 requirement. CT Abd read as LLL consolidation, although unable to appreciate that on CXR or CT. Cough resolved at this time, per patient  - O2 PRN (currently stable on RA)  - Cont Albuterol Q4 PRN  - Cont home flonase and claritin for upper airway congestion  - Tessalon perles PRN cough   # MSK: H/o chronic back pain on Oxy 10mg  TID as outpatient  - Continue oxy 5-10mg  Q8 PRN for pain - No tylenol due to cirrhosis   # Endocrine: h/o borderline DM. No previous A1c and not on any medications for hyperglycemia. A1c 4.8 (10/19)  #CV: H/o HTN, BP improved. Started Propranolol 10/19 - Continue home HCTZ 25mg  qday  - Cont Propranolol  # Heme: Anemia (at baseline) and thrombocytopenia (also at baseline). All likely of chronic disease and from hepatic cirrhosis. No need for transfusion. INR elevated to 2.09 s/p Vit K 2.5mg  x2 last.  - CBC in am  # Bloody stool: x1 episode at admission with well formed stool and only small amount of blood on toilet paper and red water in toilet. No diverticulosis on CT but cannot r/o. FOBT + in ED. Likely from internal hemorrhoids given lack of change in stool habit/nature, low blood volume, and only one occurance.  Hgb stable at baseline (between 9-10). Pt not on any anticoaglation. Last colonoscopy in 2012. Report unavailable. Rectal exam w/o gross blood, small external skin tags, and w/o fissure.  - Hgb stable at baseline.  - Protonix   # Psych: Depression well controlled on home medications  - Continue Effexor   FEN/GI: Senna BID and Miralax daily. Regular diet  PPx: SCDs,  holding heparin for thrombocytopenia, PPI  Disposition: Pending overall clinical improvement  Subjective: Patient reports worsening asterixis.  She reports that she snuck out of the hospital last night to go to the club with her brother and got very drunk.  She says that she was confused O/N, but now she is clear that she definitely went to the club.  Objective: Temp:  [97.6 F (36.4 C)-98.5 F (36.9 C)] 97.8 F (36.6 C) (10/20 0542) Pulse Rate:  [70-101] 70 (10/20 0542) Resp:  [18-20] 18 (10/20 0542) BP: (111-125)/(64-73) 111/64 mmHg (10/20 0542) SpO2:  [95 %-98 %] 98 % (10/20 0542)  Physical Exam: General: In chair, NAD. Awake, talking.  HEENT: Scleral icterus, MMM. Shorter in place  Cardiovascular: RRR no m/r/g. Respiratory: Decreased effort secondary to abd pain. Anterior and lateral ascultation clear without wheeze.  Abdomen: Obese, ascites of lower abdomen which is stable. Mildly TTP entire right side, most prominent RUQ. Unable to appreciate any hepatomegaly secondary to body habitus  Extremities: 1+ edema R>L, tender to palpation. Obvious jerking of hands with extension of arms.  Neuro: Awake, alert and oriented x4, but talks about sneaking out of hospital to go to the club last night.   Laboratory:  Recent Labs Lab 05/18/13 0609 05/19/13 0500 05/20/13 0500  WBC 7.8 7.4 7.1  HGB 8.9* 8.7* 8.9*  HCT 26.4* 25.5* 26.5*  PLT 96* 103* 96*    Recent Labs Lab 05/18/13 0609 05/19/13 0500 05/20/13 0500  NA 137 137 131*  K 4.6 3.8 3.4*  CL 106 102 97  CO2 25 26 29   BUN 3* 5* 6  CREATININE 0.74 0.75 0.75  CALCIUM 8.2* 8.4 8.5  PROT 8.5* 8.2 8.3  BILITOT 4.6* 4.7* 4.5*  ALKPHOS 162* 146* 143*  ALT 31 29 28   AST 94* 79* 78*  GLUCOSE 89 85 85   Lipase (10/17) : 15 INR (10/20) : 2.09 Ammonia (10/19): 80 Albumin (10/20) : 1.8  Imaging/Diagnostic Tests: CXR (10/17) : 1. Cardiomegaly 2. Elevation of the right hemidiaphragm. Left basilar atelectasis.    Abdominal US  (10/17) : Cholelithiasis. Borderline gallbladder wall thickening, likely related to chronic cholecystitis. No definite sonographic evidence of acute cholecystitis. Fatty liver.    CT Abdomen Pelvis W Contrast (10/17) : Gallstones. There is evidence of gallbladder wall thickening and inflammatory change. Acute cholecystitis is not excluded. Cirrhotic liver. Persistent small mass in the medial segment of the left lobe of the liver. Small hepatoma is not excluded. Free fluid around the liver, in the right pericolic gutter, and in the pelvis. There is inflammatory change along the ascending colon and possible inflammatory change of the wall of the ascending colon. No evidence of acute appendicitis. None inflammatory process is not excluded. Left lower lobe consolidation in the lungs.    HIDA Scan (10/19) :  Normal biliary patency study.   Shirlee Latch, Med Student 05/20/2013, 8:22 AM [facility] FPTS Intern pager: [phone], text pages welcome   ------------------------------------------------------------ RESIDENT ADDENDUM  Pt seen and discussed w/ MS4 Bacigalupo. Addendums made to the note above where necessary and PE below.   BP  101/86  Pulse 69  Temp(Src) 98 F (36.7 C) (Oral)  Resp 18  Ht 5\' 8"  (1.727 m)  Wt 311 lb 8 oz (141.295 kg)  BMI 47.37 kg/m2  SpO2 98% Gen: NAD, obese HEENT: MMM, EOMI CV: RRR, no m/r/g Res: CTAB, nml effort  Pt much improved overall, but some concern for AMS secondary to hyperammonemia. Starting lactulose. Also pt has had little movement since admission and is encouraged to get OOB. PT/OT ordered to help with ambulation. Likely DC in am  Of Note pt reiterated that she wants to be DNI  Shelly Flatten, MD Family Medicine PGY-3 05/20/2013, 2:24 PM

## 2013-05-20 NOTE — Progress Notes (Signed)
Per patient request, all four side rails are up on patient's bed.  Will continue to monitor patient.

## 2013-05-20 NOTE — Clinical Social Work Psychosocial (Addendum)
Clinical Social Work Department BRIEF PSYCHOSOCIAL ASSESSMENT 05/20/2013  Patient:  Colleen Olson, Colleen Olson     Account Number:  [id number]     Admit date:  05/17/2013  Clinical Social Worker:  Lavell Luster  Date/Time:  05/20/2013 03:35 PM  Referred by:  Physician  Date Referred:  05/20/2013 Referred for  SNF Placement   Other Referral:   Interview type:  Patient Other interview type:    PSYCHOSOCIAL DATA Living Status:  FAMILY Admitted from facility:   Level of care:   Primary support name:  Jola Babinski [phone] Primary support relationship to patient:  CHILD, ADULT Degree of support available:   Support is strong. Patient lives with daughter and son in-law, and grandchildren. Other Contact: Jake Shark (son in-law) [phone].    CURRENT CONCERNS Current Concerns  Post-Acute Placement   Other Concerns:    SOCIAL WORK ASSESSMENT / PLAN CSW met with patient and spoke with daughter over phone at bedside to discuss the recommendation for SNF placement and the SNF placement process. Patient and daughter are agreeable to SNF placement. Patient wants SNF in [facility]. Patient is from home with Daughter and son in-law and plans to return there once her rehab stay in complete.   Assessment/plan status:  Psychosocial Support/Ongoing Assessment of Needs Other assessment/ plan:   Complete FL2, Fax out, PASSR   Information/referral to community resources:   SNF list and CSW contact information left with patient.    PATIENT'S/FAMILY'S RESPONSE TO PLAN OF CARE: Patiet and daughter pleasant and appreciative of CSW contact. Both are agreeable to SNF search and placement at this time. Patient and daughter await bed offers.     Roddie Mc, [place], [place], [phone]

## 2013-05-21 DIAGNOSIS — K7460 Unspecified cirrhosis of liver: Secondary | ICD-10-CM

## 2013-05-21 DIAGNOSIS — D638 Anemia in other chronic diseases classified elsewhere: Secondary | ICD-10-CM

## 2013-05-21 DIAGNOSIS — G8929 Other chronic pain: Secondary | ICD-10-CM

## 2013-05-21 DIAGNOSIS — K625 Hemorrhage of anus and rectum: Secondary | ICD-10-CM

## 2013-05-21 DIAGNOSIS — D696 Thrombocytopenia, unspecified: Secondary | ICD-10-CM

## 2013-05-21 LAB — CBC
MCH: 29.2 pg (ref 26.0–34.0)
MCHC: 33.8 g/dL (ref 30.0–36.0)
Platelets: 97 10*3/uL — ABNORMAL LOW (ref 150–400)
RBC: 3.59 MIL/uL — ABNORMAL LOW (ref 3.87–5.11)
WBC: 6.8 10*3/uL (ref 4.0–10.5)

## 2013-05-21 MED ORDER — OXYCODONE HCL 5 MG PO TABS: 5.0000 mg | ORAL_TABLET | Freq: Three times a day (TID) | ORAL | Status: DC | PRN

## 2013-05-21 MED ORDER — LACTULOSE 10 GM/15ML PO SOLN: 20.0000 g | Freq: Two times a day (BID) | ORAL | Status: DC

## 2013-05-21 MED ORDER — POLYETHYLENE GLYCOL 3350 17 G PO PACK: 17.0000 g | PACK | Freq: Every day | ORAL | Status: DC

## 2013-05-21 MED ORDER — SENNA 8.6 MG PO TABS: 1.0000 | ORAL_TABLET | Freq: Two times a day (BID) | ORAL | Status: DC

## 2013-05-21 MED ORDER — PROPRANOLOL HCL ER 60 MG PO CP24: 60.0000 mg | ORAL_CAPSULE | Freq: Every day | ORAL | Status: DC

## 2013-05-21 NOTE — Progress Notes (Signed)
Chaplain visited with pt who requested assistance with Advance Directive-HCPOA.  Chaplain offered support through his visit and conversation, explained sections of Part A of Advance Directive and left the document with her.  Chaplain requested pt to review AD and said he will return later to answer questions pt may have.     05/21/13 1000  Clinical Encounter Type  Visited With Patient  Visit Type Initial  Referral From Patient  Spiritual Encounters  Spiritual Needs Emotional    Rulon Abide

## 2013-05-21 NOTE — Progress Notes (Signed)
Colleen Olson discharged Skilled nursing facility per MD order.  Report called to receiving LPN, Geraldo Docker at [facility].     Medication List         albuterol 108 (90 BASE) MCG/ACT inhaler  Commonly known as:  PROVENTIL HFA;VENTOLIN HFA  Inhale 2 puffs into the lungs every 6 (six) hours as needed for wheezing.     calcium carbonate 1250 MG tablet  Commonly known as:  OS-CAL - dosed in mg of elemental calcium  Take 1 tablet by mouth daily.     cetirizine 10 MG tablet  Commonly known as:  ZYRTEC  Take 10 mg by mouth daily.     fluticasone 50 MCG/ACT nasal spray  Commonly known as:  FLONASE  Place 2 sprays into the nose daily.     furosemide 40 MG tablet  Commonly known as:  LASIX  Take 1 tablet (40 mg total) by mouth daily.     hydrochlorothiazide 25 MG tablet  Commonly known as:  HYDRODIURIL  Take 25 mg by mouth daily.     lactulose 10 GM/15ML solution  Commonly known as:  CHRONULAC  Take 30 mLs (20 g total) by mouth 2 (two) times daily.     oxyCODONE 5 MG immediate release tablet  Commonly known as:  Oxy IR/ROXICODONE  Take 1-2 tablets (5-10 mg total) by mouth every 8 (eight) hours as needed.     pentoxifylline 400 MG CR tablet  Commonly known as:  TRENTAL  Take 400 mg by mouth 3 (three) times daily with meals.     polyethylene glycol packet  Commonly known as:  MIRALAX / GLYCOLAX  Take 17 g by mouth daily.     promethazine-phenylephrine 6.25-5 MG/5ML Syrp  Commonly known as:  PROMETHAZINE VC PLAIN  Take 5 mLs by mouth every 4 (four) hours as needed.     propranolol ER 60 MG 24 hr capsule  Commonly known as:  INDERAL LA  Take 1 capsule (60 mg total) by mouth daily.     senna 8.6 MG Tabs tablet  Commonly known as:  SENOKOT  Take 1 tablet (8.6 mg total) by mouth 2 (two) times daily.     spironolactone 100 MG tablet  Commonly known as:  ALDACTONE  Take 1 tablet (100 mg total) by mouth daily.     venlafaxine 37.5 MG tablet  Commonly  known as:  EFFEXOR  Take 1 tablet (37.5 mg total) by mouth 2 (two) times daily.        Patients skin is clean, dry and intact, no evidence of skin break down. IV site discontinued and catheter remains intact. Site without signs and symptoms of complications. Dressing and pressure applied.  Patient will be transported on a stretcher by non emergent EMS,  no distress noted upon discharge.  Colleen Olson, Colleen Olson 05/21/2013 6:06 PM

## 2013-05-21 NOTE — Discharge Summary (Signed)
Family Medicine Teaching [facility] Discharge Summary  Patient name: Colleen Olson Medical record number: [id number] Date of birth: 11-Dec-1960 Age: 52 y.o. 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 female presenting with acute RUQ abdominal pain and bloody stools. PMH is significant for Asthma, HTN, Borderline DM, HA, Arthritis, Chornic back pain, ETOH abuse, Hepatic cirrhosis and non-active hepatitis C   # Abdominal pain:  - Patient presented with acute-onset right-sided abdominal pain that was most prominent in RUQ.  CT scan on admission showed cholelithiasis concerning for possible cholecystitis. Murphy's sign was positive. Patient did not have leukocytosis and was afebrile but was in significant pain. Lipase was normal on admission, making pancreatitis any unlikely cause of abd pain.  Abd [place] was suggestive of chronic cholecystitis. Surgery was consulted and ordered a HIDA scan to investigate for cholecystitis.  HIDA scan with normal gallbladder and no cholecystitis. The patient's pain was controlled with PO Oxycodone IR 5-10mg  q8h prn, which was also given for chronic back pain.  The patient was tolerating regular diet and her abdominal pain was improved before discharge.   - Patient also reported BRBPR x1 episode at admission with well formed stool and only small amount of blood on toilet paper and red water in toilet. No  diverticulosis seen on CT at time of admission. FOBT + in ED. This was thought to be likely from internal hemorrhoids given lack of change in stool habit/nature, low blood volume, and only one occurrence. Hemoglobin remained stable at baseline (between 9-10) throughout admission. Patient is not on any anticoaglation. Her last colonoscopy was in 2012, but the report was unavailable. On admission, rectal exam was w/o gross blood, small external skin tags, and w/o fissure. She was started on PPI.  She had no further episodes of BRBPR.  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  Her scleral icterus was worse on admission than previosuly, per pt. This improved during admission.  Chronic diffuse mild itching, likely from elevated bili, was unchanged. Patient reported compliance on lasix and spironolactone. Her ascites was stable from previously, and patient showed no signs of SBP. On admission, MELD score: 20 (25% 90 day mortality), Child-pugh score: 11 (Child class C, Life expectancy 1-3 years). Patient also had new-onset, progressive asterixis on admission. Ammonia level was 80 on 10/19.  Lactulose was initially held due to potential for worsening abdominal pain or blood in stool.  Patient was found to have AMS on 10/20, so she was started on Lactulose.  Her mental status improved after starting lactulose and was back to baseline before discharge other than asterixis. Patient was continued on home Spironolactone 100mg  daily and Lasix 40mg  daily throughout admission.  She was started on Propranolol on 10/19 for concern for possible varices, as the patient reported acute-onset dysphagia and bloody stool x1 on admission. Continued lactulose upon discharge.  # H/o Asthma. Patient reported intermittent SOB when abdominal pain flared on admission.  She also has a chronic cough.  Pt was afebrile, her respiratory exam was  benign and CXR showed only possible atelectasis.  She was likely giving poor respiratory effort due to pain on deep inspiration in RUQ of abd.  CT abdomen was read as showing a LLL consolidation, but we were unable to appreciate that on CXR or CT.  Patient was continued on home albuterol q4 prn, flonase, and claritin.   She did require 2L via [employer], which was new. Upon walking with Physical Therapy 10/20 on room air, saturation decreased to 75% so 2L re-placed and sat improved. Likely due to poor effort in obese patient with no wheezes on exam and CXR findings only of atelectasis. This will be weaned as tolerated at SNF.    # Chronic back pain: Patient takes Oxycodone 10mg  TID as outpatient.  Pain was managed with oxycodone 5-10mg  Q8 PRN while admitted.  Patient was given no tylenol due to cirrhosis.   # Borderline DM: No previous A1c in the system on admission.  Patient takes no medications for hyperglycemia. A1c was 4.8 on 10/19.   # HTN: Patient was continued on home HCTZ 25mg  daily.  She was started on Propranolol on 10/19, for possible varices, as well. Her BP tolerated the addition of this medication.  # Anemia:  Patient was anemic throughout admission, and hemoglobin was at baseline of 9-10.  This is likely of chronic disease, as a result of hepatic cirrhosis.  There was no indication for a transfusion. Follow up with colonoscopy as outpatient, as pt reported small amounts of blood on her toilet paper (likely due to internal hemorrhoids, per above), and last reported colonoscopy 2012 not found in system.  #Thrombocytopenia: Patient was thrombocytopenic throughout admission.  Platelet count was at baseline ~95.  INR was elevated to 2.09 during admission and was given Vit K 2.5mg  x2 on 10/18 and 10/19.  All likely of chronic disease and from hepatic cirrhosis.   # Depression: Well controlled on home medications.  Home Effexor continued throughout admission.   Issues for Follow Up:  1. O2  requirement.  Wean O2 as tolerated. 2. F/u asterixis and continued need for lactulose. 3. Consider outpatient colonoscopy, as records unavailable for 2012 colonoscopy. F/u hemoglobin.   Significant Procedures: None  Significant Labs and Imaging:   Recent Labs Lab 05/19/13 0500 05/20/13 0500 05/21/13 1033  WBC 7.4 7.1 6.8  HGB 8.7* 8.9* 10.5*  HCT 25.5* 26.5* 31.1*  PLT 103* 96* 97*    Recent Labs Lab 05/17/13 1023 05/17/13 2101 05/18/13 0609 05/19/13 0500 05/20/13 0500  NA 136  --  137 137 131*  K 3.3*  --  4.6 3.8 3.4*  CL 102  --  106 102 97  CO2 25  --  25 26 29   GLUCOSE 115*  --  89 85 85  BUN <3*  --  3* 5* 6  CREATININE 0.74 0.70 0.74 0.75 0.75  CALCIUM 8.5  --  8.2* 8.4 8.5  ALKPHOS 164*  --  162* 146* 143*  AST 83*  --  94* 79* 78*  ALT 32  --  31 29 28   ALBUMIN 1.9*  --  1.9* 1.9* 1.8*    Lipase (10/17) : 15  INR (10/20) : 2.09  Ammonia (10/19): 80  Albumin (10/20) : 1.8  CXR (10/17) : 1. Cardiomegaly 2. Elevation of the right hemidiaphragm. Left basilar atelectasis.   Abdominal US (10/17) : Cholelithiasis. Borderline gallbladder wall thickening, likely related to chronic  cholecystitis. No definite sonographic evidence of acute cholecystitis. Fatty liver.   CT Abdomen Pelvis W Contrast (10/17) : Gallstones. There is evidence of gallbladder wall thickening and inflammatory change. Acute cholecystitis is not excluded. Cirrhotic liver. Persistent small mass in the medial segment of the left lobe of the liver. Small hepatoma is not excluded. Free fluid around the liver, in the right pericolic gutter, and in the pelvis. There is inflammatory change along the ascending colon and possible inflammatory change of the wall of the ascending colon. No evidence of acute appendicitis. None inflammatory process is not excluded. Left lower lobe consolidation in the lungs.   HIDA Scan (10/19) : Normal biliary patency study.  Results/Tests Pending at Time of Discharge:  None  Discharge Medications:    Medication List         albuterol 108 (90 BASE) MCG/ACT inhaler  Commonly known as:  PROVENTIL HFA;VENTOLIN HFA  Inhale 2 puffs into the lungs every 6 (six) hours as needed for wheezing.     calcium carbonate 1250 MG tablet  Commonly known as:  OS-CAL - dosed in mg of elemental calcium  Take 1 tablet by mouth daily.     cetirizine 10 MG tablet  Commonly known as:  ZYRTEC  Take 10 mg by mouth daily.     fluticasone 50 MCG/ACT nasal spray  Commonly known as:  FLONASE  Place 2 sprays into the nose daily.     furosemide 40 MG tablet  Commonly known as:  LASIX  Take 1 tablet (40 mg total) by mouth daily.     hydrochlorothiazide 25 MG tablet  Commonly known as:  HYDRODIURIL  Take 25 mg by mouth daily.     lactulose 10 GM/15ML solution  Commonly known as:  CHRONULAC  Take 30 mLs (20 g total) by mouth 2 (two) times daily.     oxyCODONE 5 MG immediate release tablet  Commonly known as:  Oxy IR/ROXICODONE  Take 1-2 tablets (5-10 mg total) by mouth every 8 (eight) hours as needed.     pentoxifylline 400 MG CR tablet  Commonly known as:  TRENTAL  Take 400 mg by mouth 3 (three) times daily with meals.     polyethylene glycol packet  Commonly known as:  MIRALAX / GLYCOLAX  Take 17 g by mouth daily.     promethazine-phenylephrine 6.25-5 MG/5ML Syrp  Commonly known as:  PROMETHAZINE VC PLAIN  Take 5 mLs by mouth every 4 (four) hours as needed.     propranolol ER 60 MG 24 hr capsule  Commonly known as:  INDERAL LA  Take 1 capsule (60 mg total) by mouth daily.     senna 8.6 MG Tabs tablet  Commonly known as:  SENOKOT  Take 1 tablet (8.6 mg total) by mouth 2 (two) times daily.     spironolactone 100 MG tablet  Commonly known as:  ALDACTONE  Take 1 tablet (100 mg total) by mouth daily.     venlafaxine 37.5 MG tablet  Commonly known as:  EFFEXOR  Take 1 tablet (37.5 mg total) by mouth 2 (two) times daily.        Discharge Instructions:  Please refer to Patient Instructions section of EMR for full details.  Patient was counseled important signs and symptoms that should prompt return to medical care, changes in medications, dietary instructions, activity restrictions, and follow up appointments.   Follow-Up Appointments: D/c to SNF for temporary stay  D/c Summary started by [facility]  [place] Silos  Benjamin Stain, MD 05/21/2013, 3:46 PM [facility] Family Medicine Intern Pager [phone]

## 2013-05-21 NOTE — Progress Notes (Signed)
Chaplain followed up by visiting pt who had requested assistance with an Advance Directive.  On this 2nd visit, pt told Chaplain that she would like for her daughter to review the AD.   Chaplain will follow up if necessary.   05/21/13 1500  Clinical Encounter Type  Visited With Patient  Visit Type Follow-up;Spiritual support  Spiritual Encounters  Spiritual Needs Other (Comment) (Advance Directive request)    Rulon Abide

## 2013-05-21 NOTE — Plan of Care (Signed)
Problem: Phase I Progression Outcomes Goal: Initial discharge plan identified Outcome: Completed/Met Date Met:  05/21/13 Needs SNF for rehab at discharge  Problem: Phase II Progression Outcomes Goal: Progress activity as tolerated unless otherwise ordered Outcome: Completed/Met Date Met:  05/21/13 OOB to chair today

## 2013-05-21 NOTE — Progress Notes (Addendum)
Family Medicine Teaching Service Daily Progress Note Intern Pager: [phone]  Patient name: Colleen Olson Medical record number: [id number] Date of birth: Mar 10, 1961 Age: 52 y.o. Gender: female  Primary Care Provider: Rodman Pickle, MD Consultants: Surgery (signed off 10/20) Code Status: Partial Code. DNI  Pt Overview and Major Events to Date:  05/17/13 - Admitted for rectal bleeding and abd pain  05/18/13 - Surgery consulted, recommend HIDA scan  05/19/13 - HIDA with normal gallbladder and no signs of cholecystitis.  Assessment and Plan: Colleen Olson is a 52 y.o. female presenting with acute RUQ abdominal pain and bloody stools. PMH is significant for Asthma, HTN, Borderline DM, HA, Arthritis, Chornic back pain, ETOH abuse, Hepatic cirrhosis and non-active hepatitis C   # Abdominal pain: Improving. Localized to the RUQ w/ CT scan w/ cholelithiasis and possible cholecystitis. Murphy's sign positive. No WBC and afebrile but in significant pain. No other acute process noted. Not likely pancreatitis with normal lipase. Abd [place] suggestive of chronic cholecystitis. HIDA scan with normal gallbladder and no cholecystitis.  - PO Oxycodone IR 5-10mg  q8h prn  - Patient tolerating PO  - Appreciate surgery's recommendations. No surgical need at this time. No cholecystitis, abx d/c'd.  - PT/OT rec SNF.   # Cirrhosis: H/o Hep C (although not active) and ETOH abuse (Last drink ~ 6 wks ago per pt). Scleral icterus is worse, per pt. Diffuse mild itching likely from elevated bili which is chronic symptom for pt. No sign of overt worsening. Reports compliance on lasix and spiro. Dysphagia concerning for varices but acute onset makes less likely. Bruising likely from thrombocytopenia from poor hepatic fxn. MELD score: 20 (25% 90 day mortality), Child-pugh score: 11 (Child class C, Life expectancy 1-3 years). New-onset, progressive asterixis. AMS on 10/20, less confused 10/21. Ammonia level 80 (10/19)  -  Continue Propranolol for possible varices and HTN control  - Continue home Spiro 100 / Lasix 40  - Continue lactulose.   # Res: h/o asthma. Intermittent SOB per pt when abdominal pain flairs. Also has chronic cough. No SOB otherwise is less concerning for acute respiratory process. Resp exam benign and CXR w/ possible atelectasis. Likely poor effort due to pain on deep inspiration in RUQ of abd. No O2 requirement. CT Abd read as LLL consolidation, although unable to appreciate that on CXR or CT. Cough resolved at this time, per patient  - O2 PRN (currently stable on RA)  - Cont Albuterol Q4 PRN  - Cont home flonase and claritin for upper airway congestion  - Tessalon perles PRN cough   # MSK: H/o chronic back pain on Oxy 10mg  TID as outpatient  - Continue oxy 5-10mg  Q8 PRN for pain  - No tylenol due to cirrhosis   # Endocrine: h/o borderline DM. No previous A1c and not on any medications for hyperglycemia. A1c 4.8 (10/19)   #CV: H/o HTN, Currently normotensive. Started Propranolol 10/19  - Continue home HCTZ 25mg  qday  - Cont Propranolol   # Heme: Anemia (at baseline) and thrombocytopenia (also at baseline). All likely of chronic disease and from hepatic cirrhosis. No need for transfusion. INR elevated to 2.09 (10/20) s/p Vit K 2.5mg  x2 10/18 and 10/19.   # Bloody stool: x1 episode at admission with well formed stool and only small amount of blood on toilet paper and red water in toilet. No diverticulosis on CT but cannot r/o. FOBT + in ED. Likely from internal hemorrhoids given lack of change in stool habit/nature, low blood volume,  and only one occurrence. Hgb stable at baseline (between 9-10). Pt not on any anticoaglation. Last colonoscopy in 2012. Report unavailable. Rectal exam w/o gross blood, small external skin tags, and w/o fissure.   - Hgb stable at baseline.  - Continue Protonix   # Psych: Depression well controlled on home medications  - Continue home Effexor   FEN/GI: Senna  BID and Miralax daily. Regular diet  PPx: SCDs, holding heparin for thrombocytopenia, PPI   Disposition: PT and OT rec SNF.  C/s SW 10/21 for SNF placement.   Subjective: Patient reports that she slept well and does not feel confused.  She is wanting to be placed in a SNF to get well before going home.  She reports 1 BM in last 24 hours.  Objective: Temp:  [98 F (36.7 C)-98.5 F (36.9 C)] 98.5 F (36.9 C) (10/21 0518) Pulse Rate:  [67-80] 67 (10/21 0518) Resp:  [18] 18 (10/21 0518) BP: (100-110)/(64-86) 100/68 mmHg (10/21 0518) SpO2:  [75 %-98 %] 96 % (10/21 0518)  Physical Exam: General: In chair, NAD. Awake, talking.  HEENT: Scleral icterus, MMM.   Cardiovascular: RRR no m/r/g.  Respiratory: Decreased effort. CTAB, no w/r/c.  Abdomen: Obese, ascites of lower abdomen which is stable. Mildly TTP in RUQ.  Extremities: 1+ edema R>L, tender to palpation. Obvious jerking of hands with extension of arms.  Neuro: Awake, alert and oriented x4.   Laboratory:  Recent Labs Lab 05/18/13 0609 05/19/13 0500 05/20/13 0500  WBC 7.8 7.4 7.1  HGB 8.9* 8.7* 8.9*  HCT 26.4* 25.5* 26.5*  PLT 96* 103* 96*    Recent Labs Lab 05/18/13 0609 05/19/13 0500 05/20/13 0500  NA 137 137 131*  K 4.6 3.8 3.4*  CL 106 102 97  CO2 25 26 29   BUN 3* 5* 6  CREATININE 0.74 0.75 0.75  CALCIUM 8.2* 8.4 8.5  PROT 8.5* 8.2 8.3  BILITOT 4.6* 4.7* 4.5*  ALKPHOS 162* 146* 143*  ALT 31 29 28   AST 94* 79* 78*  GLUCOSE 89 85 85    Lipase (10/17) : 15  INR (10/20) : 2.09  Ammonia (10/19): 80  Albumin (10/20) : 1.8   Imaging/Diagnostic Tests: CXR (10/17) : 1. Cardiomegaly 2. Elevation of the right hemidiaphragm. Left basilar atelectasis.   Abdominal US (10/17) : Cholelithiasis. Borderline gallbladder wall thickening, likely related to chronic cholecystitis. No definite sonographic evidence of acute cholecystitis. Fatty liver.   CT Abdomen Pelvis W Contrast (10/17) : Gallstones. There is  evidence of gallbladder wall thickening and inflammatory change. Acute cholecystitis is not excluded. Cirrhotic liver. Persistent small mass in the medial segment of the left lobe of the liver. Small hepatoma is not excluded. Free fluid around the liver, in the right pericolic gutter, and in the pelvis. There is inflammatory change along the ascending colon and possible inflammatory change of the wall of the ascending colon. No evidence of acute appendicitis. None inflammatory process is not excluded. Left lower lobe consolidation in the lungs.   HIDA Scan (10/19) : Normal biliary patency study.   Shirlee Latch, Med Student 05/21/2013, 8:03 AM [facility] FPTS Intern pager: [phone], text pages welcome  I have seen and examined patient with [facility] and agree with assessment and plan outlined above. S: Patient's right-sided abdominal pain is present but improved, reports chest pain and SOB related to her pain. Thinks she needs to have a BM. Needs to void.   O: BP 110/57  Pulse 69  Temp(Src) 98.3  F (36.8 C) (Oral)  Resp 20  Ht 5\' 8"  (1.727 m)  Wt 311 lb 8 oz (141.295 kg)  BMI 47.37 kg/m2  SpO2 96% NAD, pleasant CV: RRR, distant, chest tender to palpation PULM: Normal effort, CTAB anteriorly ABD: Obese, moderate right-sided and epigastric tenderness, NABS HEENT: Scleral mild icterus NEURO: Asterixis present, severe. Mildly slurred speech, A+O x 3 (except does not know it's the 20th) but question cognitive deficit (?chronic vs acute), EOMI  A/P: 52 y.o. female with EtOH abuse hx found to have ammonemia/hepatic encephalopathy with asterixis and jaundice, also with abdominal pain.  - With chest pain reported today, though likely from epigastric abdominal pain, RRR on exam.  - 2L O2 requirement - currently on room air. Will perform ambulatory pulse oximetry. - Abdominal pain - tolerating PO today. - With asterixis, pt on lactulose until improved  mentation/resolution of asterixis. Continue spironolactone, lasix, and propranolol - If fever or leukocytosis, consider SBP though no significant ascites / difficult to discern with obesity - Anemia - chronic, Hgb improved to 10.5 today, reports small amount of rectal bleeding, possible internal hemorrhoids with only skin tags and no blood seen on DRE yesterday. Consider outpatient colonoscopy (2012 per pt, though report unavailable) - Thrombocytopenia - at baseline - Dispo pending improvement in mentation and SW consulted for SNF placement short-term until patient can return home with daughter.  Leona Singleton, MD 05/21/2013 2:39 PM PGY-2 [facility] [place] [facility] FPTS Intern pager: [phone], text pages welcome

## 2013-05-21 NOTE — Clinical Social Work Placement (Signed)
Clinical Social Work Department CLINICAL SOCIAL WORK PLACEMENT NOTE 05/21/2013  Patient:  Colleen Olson, Colleen Olson  Account Number:  [id number] Admit date:  05/17/2013  Clinical Social Worker:  Cherre Blanc, [place]  Date/time:  05/21/2013 04:39 PM  Clinical Social Work is seeking post-discharge placement for this patient at the following level of care:   SKILLED NURSING   (*CSW will update this form in Epic as items are completed)   05/20/2013  Patient/family provided with [name] Department of Clinical Social Work's list of facilities offering this level of care within the geographic area requested by the patient (or if unable, by the patient's family).  05/20/2013  Patient/family informed of their freedom to choose among providers that offer the needed level of care, that participate in Medicare, Medicaid or managed care program needed by the patient, have an available bed and are willing to accept the patient.  05/20/2013  Patient/family informed of [facility]' ownership interest in [facility], as well as of the fact that they are under no obligation to receive care at this facility.  PASARR submitted to EDS on 05/21/2013 PASARR number received from EDS on   FL2 transmitted to all facilities in geographic area requested by pt/family on  05/21/2013 FL2 transmitted to all facilities within larger geographic area on   Patient informed that his/her managed care company has contracts with or will negotiate with  certain facilities, including the following:     Patient/family informed of bed offers received:  05/21/2013 Patient chooses bed at [facility] [place] Physician recommends and patient chooses bed at    Patient to be transferred to [facility] on  05/21/2013 Patient to be transferred to facility by Ambulance  The following physician request were entered in Epic:   Additional Comments: Per MD patient ready for DC 05/21/13.  Patient DC to [facility] 05/21/13. Facility, RN, patient, and daughter notified of patient's DC. RN givien number for report. Ambulance transport requested for 4:45PM. Daughter will help patient complete paperwork at facility. CSW signing off.   Roddie Mc, [employer], [place], [phone]

## 2013-05-21 NOTE — Progress Notes (Signed)
FMTS Attending Daily Note:  Renold Don MD  [phone] pager  Family Practice pager:  [phone] I have seen and examined this patient and have reviewed their chart. I have discussed this patient with the resident. I agree with the resident's findings, assessment and care plan.  Additionally:  Improving.  Pushing diuresis. Treating HCAP.  Dispo is SNF placement.    Tobey Grim, MD 05/21/2013 3:26 PM

## 2013-05-22 ENCOUNTER — Non-Acute Institutional Stay (SKILLED_NURSING_FACILITY): Payer: Medicare Other | Admitting: Internal Medicine

## 2013-05-22 DIAGNOSIS — M549 Dorsalgia, unspecified: Secondary | ICD-10-CM

## 2013-05-22 DIAGNOSIS — F3289 Other specified depressive episodes: Secondary | ICD-10-CM

## 2013-05-22 DIAGNOSIS — F329 Major depressive disorder, single episode, unspecified: Secondary | ICD-10-CM

## 2013-05-22 DIAGNOSIS — B182 Chronic viral hepatitis C: Secondary | ICD-10-CM

## 2013-05-22 DIAGNOSIS — D696 Thrombocytopenia, unspecified: Secondary | ICD-10-CM

## 2013-05-22 DIAGNOSIS — F32A Depression, unspecified: Secondary | ICD-10-CM

## 2013-05-22 DIAGNOSIS — R1011 Right upper quadrant pain: Secondary | ICD-10-CM

## 2013-05-22 DIAGNOSIS — F1020 Alcohol dependence, uncomplicated: Secondary | ICD-10-CM

## 2013-05-22 DIAGNOSIS — D638 Anemia in other chronic diseases classified elsewhere: Secondary | ICD-10-CM

## 2013-05-22 DIAGNOSIS — G8929 Other chronic pain: Secondary | ICD-10-CM

## 2013-05-22 DIAGNOSIS — I10 Essential (primary) hypertension: Secondary | ICD-10-CM

## 2013-05-22 DIAGNOSIS — J45909 Unspecified asthma, uncomplicated: Secondary | ICD-10-CM

## 2013-05-22 DIAGNOSIS — R296 Repeated falls: Secondary | ICD-10-CM

## 2013-05-22 DIAGNOSIS — K625 Hemorrhage of anus and rectum: Secondary | ICD-10-CM

## 2013-05-22 DIAGNOSIS — Z9181 History of falling: Secondary | ICD-10-CM

## 2013-05-22 DIAGNOSIS — K7460 Unspecified cirrhosis of liver: Secondary | ICD-10-CM

## 2013-05-22 NOTE — Care Management Note (Signed)
    Page 1 of 1   05/22/2013     8:52:24 AM   CARE MANAGEMENT NOTE 05/22/2013  Patient:  Colleen Olson, Colleen Olson   Account Number:  [id number]  Date Initiated:  05/22/2013  Documentation initiated by:  Letha Cape  Subjective/Objective Assessment:   admit     Action/Plan:   Anticipated DC Date:  05/21/2013   Anticipated DC Plan:  HOME/SELF CARE      DC Planning Services  CM consult      Choice offered to / List presented to:             Status of service:  Completed, signed off Medicare Important Message given?   (If response is "NO", the following Medicare IM given date fields will be blank) Date Medicare IM given:   Date Additional Medicare IM given:    Discharge Disposition:  HOME/SELF CARE  Per UR Regulation:  Reviewed for med. necessity/level of care/duration of stay  If discussed at Long Length of Stay Meetings, dates discussed:    Comments:

## 2013-05-22 NOTE — Progress Notes (Signed)
Patient ID: Colleen Olson, female   DOB: 1961/06/08, 52 y.o.   MRN: [id number] Provider:  Gwenith Spitz. Renato Gails, D.O., C.M.D. Location:  [facility]  PCP: Rodman Pickle, MD  Code Status: full code   No Known Allergies  Chief Complaint  Patient presents with  . Hospitalization Follow-up    new admission s/p hospitalization with acute abdominal pain and bloody stools--has cirrhosis secondary to hep C and alcohol abuse    HPI: 52 y.o. female with h/o cirrhosis secondary to hepatitis C and alcohol abuse, asthma, OA knees, chronic low back pain, cholelithiasis.  She had rectal bleeding during her hospitalization and was started on PPI.  She requires f/u with GI as outpatient for cscope and EGD.  She is here for short term rehab.  ROS: Review of Systems  Constitutional: Positive for malaise/fatigue. Negative for fever and chills.  HENT: Negative for congestion.   Eyes: Negative for blurred vision.  Respiratory: Negative for shortness of breath.   Cardiovascular: Negative for chest pain.  Gastrointestinal: Positive for heartburn, abdominal pain, blood in stool and melena. Negative for nausea and vomiting.  Genitourinary: Negative for dysuria.  Musculoskeletal: Positive for back pain and myalgias.  Skin: Negative for rash.  Neurological: Positive for weakness. Negative for loss of consciousness.  Psychiatric/Behavioral: Positive for memory loss.     Past Medical History  Diagnosis Date  . Asthma   . Hypertension   . Angina   . Osteoarthritis of both knees   . Exertional dyspnea 12/01/11  . Borderline diabetes   . Headache(784.0) 12/02/11    "sometimes when I wake up in am; real bad"  . Arthritis   . Chronic lower back pain   . Tachycardia    Past Surgical History  Procedure Laterality Date  . Joint replacement    . Total knee arthroplasty  03/02/2011    left  . Tonsillectomy      "I was a little girl"  . Tubal ligation  1982   Social History:   reports that she  has never smoked. She has never used smokeless tobacco. She reports that she drinks alcohol. She reports that she does not use illicit drugs.  Family History  Problem Relation Age of Onset  . Diabetes Mother   . Diabetes Father   . Diabetes Sister   . Diabetes Brother     Medications: Patient's Medications  New Prescriptions   No medications on file  Previous Medications   ALBUTEROL (PROVENTIL HFA;VENTOLIN HFA) 108 (90 BASE) MCG/ACT INHALER    Inhale 2 puffs into the lungs every 6 (six) hours as needed for wheezing.   CALCIUM CARBONATE (OS-CAL - DOSED IN MG OF ELEMENTAL CALCIUM) 1250 MG TABLET    Take 1 tablet by mouth daily.   CETIRIZINE (ZYRTEC) 10 MG TABLET    Take 10 mg by mouth daily.   FLUTICASONE (FLONASE) 50 MCG/ACT NASAL SPRAY    Place 2 sprays into the nose daily.   FUROSEMIDE (LASIX) 40 MG TABLET    Take 1 tablet (40 mg total) by mouth daily.   HYDROCHLOROTHIAZIDE (HYDRODIURIL) 25 MG TABLET    Take 25 mg by mouth daily.   LACTULOSE (CHRONULAC) 10 GM/15ML SOLUTION    Take 30 mLs (20 g total) by mouth 2 (two) times daily.   OXYCODONE (OXY IR/ROXICODONE) 5 MG IMMEDIATE RELEASE TABLET    Take 1-2 tablets (5-10 mg total) by mouth every 8 (eight) hours as needed.   PENTOXIFYLLINE (TRENTAL) 400 MG CR TABLET  Take 400 mg by mouth 3 (three) times daily with meals.   POLYETHYLENE GLYCOL (MIRALAX / GLYCOLAX) PACKET    Take 17 g by mouth daily.   PROMETHAZINE-PHENYLEPHRINE (PROMETHAZINE VC PLAIN) 6.25-5 MG/5ML SYRP    Take 5 mLs by mouth every 4 (four) hours as needed.   PROPRANOLOL ER (INDERAL LA) 60 MG 24 HR CAPSULE    Take 1 capsule (60 mg total) by mouth daily.   SENNA (SENOKOT) 8.6 MG TABS TABLET    Take 1 tablet (8.6 mg total) by mouth 2 (two) times daily.   SPIRONOLACTONE (ALDACTONE) 100 MG TABLET    Take 1 tablet (100 mg total) by mouth daily.   VENLAFAXINE (EFFEXOR) 37.5 MG TABLET    Take 1 tablet (37.5 mg total) by mouth 2 (two) times daily.  Modified Medications   No  medications on file  Discontinued Medications   No medications on file     Physical Exam: There were no vitals filed for this visit. Physical Exam  Constitutional: She appears well-developed and well-nourished. No distress.  Obese black female  HENT:  Head: Normocephalic and atraumatic.  Right Ear: External ear normal.  Left Ear: External ear normal.  Nose: Nose normal.  Mouth/Throat: Oropharynx is clear and moist. No oropharyngeal exudate.  Eyes: Conjunctivae and EOM are normal. Pupils are equal, round, and reactive to light.  Neck: Normal range of motion. Neck supple.  Cardiovascular: Normal rate, regular rhythm, normal heart sounds and intact distal pulses.   Pulmonary/Chest: Effort normal and breath sounds normal. No respiratory distress.  Abdominal: Soft. Bowel sounds are normal. She exhibits no distension. There is tenderness.  Musculoskeletal: Normal range of motion. She exhibits edema and tenderness.  Low back tenderness  Neurological: She is alert. No cranial nerve deficit.  Oriented to person and place, confused during conversation, irritable  Skin: Skin is warm and dry.     Labs reviewed: Basic Metabolic Panel:  Recent Labs  47/82/95 1542 04/07/13 2025  05/18/13 0609 05/19/13 0500 05/20/13 0500  NA 129*  --   < > 137 137 131*  K 3.2*  --   < > 4.6 3.8 3.4*  CL 95*  --   < > 106 102 97  CO2 22  --   < > 25 26 29   GLUCOSE 100*  --   < > 89 85 85  BUN 4*  --   < > 3* 5* 6  CREATININE 0.66  --   < > 0.74 0.75 0.75  CALCIUM 8.9  --   < > 8.2* 8.4 8.5  MG  --  1.9  --   --   --   --   PHOS  --  3.0  --   --   --   --   < > = values in this interval not displayed. Liver Function Tests:  Recent Labs  05/18/13 0609 05/19/13 0500 05/20/13 0500  AST 94* 79* 78*  ALT 31 29 28   ALKPHOS 162* 146* 143*  BILITOT 4.6* 4.7* 4.5*  PROT 8.5* 8.2 8.3  ALBUMIN 1.9* 1.9* 1.8*    Recent Labs  05/17/13 2101  LIPASE 15    Recent Labs  05/19/13 1400  AMMONIA  80*   CBC:  Recent Labs  02/28/13 1441 04/07/13 1542  05/19/13 0500 05/20/13 0500 05/21/13 1033  WBC 7.2 6.6  < > 7.4 7.1 6.8  NEUTROABS  --  3.5  --   --   --   --  HGB 12.2 10.2*  < > 8.7* 8.9* 10.5*  HCT 36.5 28.9*  < > 25.5* 26.5* 31.1*  MCV 90.1 87.6  < > 87.6 88.3 86.6  PLT 138* 79*  < > 103* 96* 97*  < > = values in this interval not displayed. Cardiac Enzymes:  Recent Labs  04/07/13 2025 04/08/13 0142 04/08/13 0810  TROPONINI <0.30 <0.30 <0.30  CBG:  Recent Labs  05/19/13 0749 05/19/13 1614 05/19/13 2149  GLUCAP 80 149* 96    Imaging and Procedures: 04/17/13:  CT abd/pelvis:  Nodular hepatic contour suggestive of cirrhosis. Body wall edema  and mild gallbladder wall thickening may be associated with  underlying level with disease and/or hypoproteinemia, but are  otherwise nonspecific.  Hyperdensity within the gallbladder may represent vicarious  excretion of contrast from previous contrast enhanced exam  04/07/2013 or coexisting sludge. Gallbladder wall calcification is  considered less likely given the configuration.  Ill-defined 1 cm hypodense lesions in the medial segment left  hepatic lobe and posterior segment right hepatic lobe as above. In  the setting of probable cirrhosis, underlying hepatoma is not  excluded. As an outpatient, non emergent liver mass protocol  abdominal MRI is recommended for further evaluation and  characterization. 05/17/13 CXR:  1. Cardiomegaly  2. Elevation of the right hemidiaphragm. Left basilar atelectasis  05/17/13:  CT abd/pelvis:  Gallstones. There is evidence of gallbladder wall thickening and  inflammatory change. Acute cholecystitis is not excluded.  Cirrhotic liver.  Persistent small mass in the medial segment of the left lobe of the  liver. Small hepatoma is not excluded.  Free fluid around the liver, in the right pericolic gutter, and in  the pelvis.  There is inflammatory change along the ascending colon  and possible  inflammatory change of the wall of the ascending colon. No evidence  of acute appendicitis. None inflammatory process is not excluded.  Left lower lobe consolidation in the lungs.  05/17/13:  Complete abdominal US:  Cholelithiasis. Borderline gallbladder wall thickening, likely  related to chronic cholecystitis. No definite sonographic evidence  of acute cholecystitis.  Fatty liver.  05/18/13:  Nuclear medicine hepatobiliary scan:  Normal biliary patency study.  Assessment/Plan 1. Abdominal pain, chronic, right upper quadrant -? Due to hepatitis at this point vs. Diverticular bleeding--has not been a consistent complaint -advised staff to continue to monitor abdominal pain and notify me if persists -needs outpt GI follow up  2. Alcohol dependence -in remission while in facility -is eager to discharge home  3. Bright red blood per rectum -f/u cbc, bmp, will need GI follow up as outpatient after completes rehab here (needs EGD and cscope) b/c she was just started on PPI, but this was not investigated further -likely has portal htn with esophageal varices   4. BACK PAIN, CHRONIC -longstanding, continue current pain regimen, monitor for sedation  5. Anemia of chronic disease -f/u cbc  6. Depression -persists, should be seen by [facility] when they are here  7. Frequent falls -at home -likely due to encephalopathy, low back pain, alcohol abuse  8. Morbid obesity -interferes with ability to exercise and contributes to her pain -to work with PT, OT while here  9. 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 Cirrhosis of liver due to hepatitis C -seems advanced based on her encephalopathy recurring -will recheck cbc, bmp, liver panel and ammonia next draw  11. Essential hypertension, benign -at goal with current therapy, continue current meds  12. ASTHMA, INTERMITTENT -  lungs clear at present, but requiring oxygen therapy, cont to  monitor   Family/ staff Communication: discussed with nursing staff  Labs/tests ordered:  Cbc, bmp, liver panel, ammonia next draw, f/u with GI for cscope, probably EGD, as well given cirrhosis

## 2013-05-29 ENCOUNTER — Encounter: Payer: Self-pay | Admitting: Family Medicine

## 2013-05-29 ENCOUNTER — Ambulatory Visit (INDEPENDENT_AMBULATORY_CARE_PROVIDER_SITE_OTHER): Payer: Medicare Other | Admitting: Family Medicine

## 2013-05-29 ENCOUNTER — Other Ambulatory Visit: Payer: Self-pay | Admitting: *Deleted

## 2013-05-29 ENCOUNTER — Telehealth: Payer: Self-pay

## 2013-05-29 VITALS — BP 114/71 | HR 73 | Temp 97.8°F | Wt 278.0 lb

## 2013-05-29 DIAGNOSIS — K7460 Unspecified cirrhosis of liver: Secondary | ICD-10-CM

## 2013-05-29 DIAGNOSIS — Z593 Problems related to living in residential institution: Secondary | ICD-10-CM

## 2013-05-29 DIAGNOSIS — Y9210 Unspecified residential institution as the place of occurrence of the external cause: Secondary | ICD-10-CM

## 2013-05-29 MED ORDER — OXYCODONE HCL 5 MG PO TABS: 5.0000 mg | ORAL_TABLET | Freq: Three times a day (TID) | ORAL | Status: DC | PRN

## 2013-05-29 NOTE — Progress Notes (Signed)
Patient ID: Colleen Olson, female   DOB: 1960/12/04, 52 y.o.   MRN: [id number]  [name] Colleen Olson M. Drea Jurewicz, MD Phone: [phone]   Subjective: HPI: Patient is a 52 y.o. female presenting to clinic today for hospital follow up appointment. Concerns today include leaving SNF.  Patient admitted to the hospital with edema, worsening jaundice, new O2 requirement and overall decline of her liver function. She has been doing well since discharge. She has diuresed 30+lbs. She is wearing O2 daily for comfort. She was started on Propranolol for varices which is also going well. At discharge, she went to [facility] which daughter is not pleased with. She feels like she is not being well cared for and she is not participating in activities like she should. Daughter comes to get her daily and makes her walk, go to the toilet, etc. Daughter plans on taking her home next week. Will need PT/OT/home health RN at discharge which the social worker is working on this. Daughter is primary care giver, needs note for work. Concerned that her medications will need to be filled at discharge, which I would assume the SNF will do but she can let me know if they are not able to.  History Reviewed: Non smoker. Health Maintenance: UTD  ROS: Please see HPI above.  Objective: Office vital signs reviewed. BP 114/71  Pulse 73  Temp(Src) 97.8 F (36.6 C) (Oral)  Wt 278 lb (126.1 kg)  BMI 42.28 kg/m2  Physical Examination:  General: Awake, alert. NAD. More alert/oriented than prior visits HEENT: Atraumatic, normocephalic. [place] in place Pulm: CTAB, no wheezes Cardio: RRR, no murmurs appreciated Abdomen: Obese, +BS, soft, nontender, nondistended. Edema has resolved. Extremities: Trace edema without tenderness Neuro: Unable to fully assess. Alert, awake, moves all extremities. Sitting in wheelchair  Assessment: 52 y.o. female hospital follow-up  Plan: See Problem List and After Visit  Summary

## 2013-05-29 NOTE — Patient Instructions (Signed)
Please let me know if you need anything after discharge from [facility]. She should get her prescriptions at that time, but we can double check to make sure she has everything including her Ensure.  I would like to see her back in about a month for a check up, or sooner if you need anything.  Diva Lemberger M. Gerado Nabers, M.D.

## 2013-05-29 NOTE — Telephone Encounter (Signed)
Daughter needs a letter on letterhead stating that mother's condition has worsened and that she now needs care around the clock. I reminded daughter about scheduled appt today at 3:30pm. Appt was forgotten by daughter will try her best to get mother here.

## 2013-05-29 NOTE — Telephone Encounter (Signed)
Note written at appt. Jazmin Hartsell,CMA

## 2013-05-30 DIAGNOSIS — Z789 Other specified health status: Secondary | ICD-10-CM | POA: Insufficient documentation

## 2013-05-30 DIAGNOSIS — Z593 Problems related to living in residential institution: Secondary | ICD-10-CM | POA: Insufficient documentation

## 2013-05-30 NOTE — Assessment & Plan Note (Signed)
At [facility], but planning on leaving next week to reside with daughter. Given note explaining that her medical status has changed and daughter will need to miss more work due to this. F/u in 2-3 weeks or sooner if needed

## 2013-05-30 NOTE — Assessment & Plan Note (Signed)
Con't Lasix and Propranolol. Will f/u with liver specialist next month. Signed release of info to get copy of office notes. Also encouraged them to discuss goals of care.

## 2013-06-04 ENCOUNTER — Encounter: Payer: Self-pay | Admitting: Internal Medicine

## 2013-06-04 ENCOUNTER — Non-Acute Institutional Stay (SKILLED_NURSING_FACILITY): Payer: Medicare Other | Admitting: Internal Medicine

## 2013-06-04 DIAGNOSIS — B182 Chronic viral hepatitis C: Secondary | ICD-10-CM

## 2013-06-04 DIAGNOSIS — D638 Anemia in other chronic diseases classified elsewhere: Secondary | ICD-10-CM

## 2013-06-04 DIAGNOSIS — F1020 Alcohol dependence, uncomplicated: Secondary | ICD-10-CM

## 2013-06-04 DIAGNOSIS — J45909 Unspecified asthma, uncomplicated: Secondary | ICD-10-CM

## 2013-06-04 DIAGNOSIS — M549 Dorsalgia, unspecified: Secondary | ICD-10-CM

## 2013-06-04 DIAGNOSIS — F329 Major depressive disorder, single episode, unspecified: Secondary | ICD-10-CM

## 2013-06-04 DIAGNOSIS — I10 Essential (primary) hypertension: Secondary | ICD-10-CM

## 2013-06-04 DIAGNOSIS — F32A Depression, unspecified: Secondary | ICD-10-CM

## 2013-06-04 DIAGNOSIS — K7460 Unspecified cirrhosis of liver: Secondary | ICD-10-CM

## 2013-06-04 NOTE — Progress Notes (Signed)
MRN: [id number] Name: Colleen Olson  Sex: female Age: 52 y.o. DOB: January 09, 1961  PSC #: Ronni Rumble Facility/Room: 212B Level Of Care: SNF Provider: Merrilee Seashore D Emergency Contacts: Extended Emergency Contact Information Primary Emergency Contact: Singleton,[name] Address: [address]          [place], [place] [address] [place] [employer] Home Phone: [phone] Work Phone: [phone] Mobile Phone: [phone] Relation: Daughter Secondary Emergency Contact: Singleton,Harrold Address: [address]          [place], [place] [address] [place] Home Phone: [phone] Mobile Phone: [phone] Relation: Son  Code Status: FULL  Allergies: Review of patient's allergies indicates no known allergies.  Chief Complaint  Patient presents with  . Discharge Note    HPI: Patient is 52 y.o. female who is able to be discharged to home.  Past Medical History  Diagnosis Date  . Asthma   . Hypertension   . Angina   . Osteoarthritis of both knees   . Exertional dyspnea 12/01/11  . Borderline diabetes   . Headache(784.0) 12/02/11    "sometimes when I wake up in am; real bad"  . Arthritis   . Chronic lower back pain   . Tachycardia     Past Surgical History  Procedure Laterality Date  . Joint replacement    . Total knee arthroplasty  03/02/2011    left  . Tonsillectomy      "I was a little girl"  . Tubal ligation  1982      Medication List       This list is accurate as of: 06/04/13  2:23 PM.  Always use your most recent med list.               albuterol 108 (90 BASE) MCG/ACT inhaler  Commonly known as:  PROVENTIL HFA;VENTOLIN HFA  Inhale 2 puffs into the lungs every 6 (six) hours as needed for wheezing.     calcium carbonate 1250 MG tablet  Commonly known as:  OS-CAL - dosed in mg of elemental calcium  Take 1 tablet by mouth daily.     cetirizine 10 MG tablet  Commonly known as:  ZYRTEC  Take 10 mg by mouth daily.     fluticasone 50 MCG/ACT nasal spray  Commonly known as:  FLONASE  Place 2 sprays into the nose daily.     furosemide 40 MG tablet  Commonly known as:  LASIX  Take 1 tablet (40 mg total) by mouth daily.     hydrochlorothiazide 25 MG tablet  Commonly known as:  HYDRODIURIL  Take 25 mg by mouth daily.     lactulose 10 GM/15ML solution  Commonly known as:  CHRONULAC  Take 30 mLs (20 g total) by mouth 2 (two) times daily.     oxyCODONE 5 MG immediate release tablet  Commonly known as:  Oxy IR/ROXICODONE  Take 1-2 tablets (5-10 mg total) by mouth every 8 (eight) hours as needed.     pentoxifylline 400 MG CR tablet  Commonly known as:  TRENTAL  Take 400 mg by mouth 3 (three) times daily with meals.     polyethylene glycol packet  Commonly known as:  MIRALAX / GLYCOLAX  Take 17 g by mouth daily.     potassium chloride SA 20 MEQ tablet  Commonly known as:  K-DUR,KLOR-CON  Take 20 mEq by mouth daily.     propranolol ER 60 MG 24 hr capsule  Commonly known as:  INDERAL LA  Take 1 capsule (  60 mg total) by mouth daily.     senna 8.6 MG Tabs tablet  Commonly known as:  SENOKOT  Take 1 tablet (8.6 mg total) by mouth 2 (two) times daily.     spironolactone 100 MG tablet  Commonly known as:  ALDACTONE  Take 1 tablet (100 mg total) by mouth daily.     venlafaxine 37.5 MG tablet  Commonly known as:  EFFEXOR  Take 1 tablet (37.5 mg total) by mouth 2 (two) times daily.        Meds ordered this encounter  Medications  . potassium chloride SA (K-DUR,KLOR-CON) 20 MEQ tablet    Sig: Take 20 mEq by mouth daily.    Immunization History  Administered Date(s) Administered  . Influenza Split 05/17/2011, 04/20/2012  . Influenza Whole 07/22/2010  . Tdap 05/25/2011    History  Substance Use Topics  . Smoking status: Never Smoker   . Smokeless tobacco: Never Used     Comment: 12/02/11 "around second hand smoke"  . Alcohol Use: Yes     Comment: 3-5 40oz. beers per day    Filed  Vitals:   06/04/13 1409  BP: 112/87  Pulse: 66  Temp: 97.9 F (36.6 C)  Resp: 18    Physical Exam  GENERAL APPEARANCE: Alert, conversant. Appropriately groomed. No acute distress.  HEENT: Unremarkable. RESPIRATORY: Breathing is even, unlabored. Lung sounds are clear   CARDIOVASCULAR: Heart RRR no murmurs, rubs or gallops. No peripheral edema.  GASTROINTESTINAL: Abdomen is soft, non-tender, not distended w/ normal bowel sounds.  NEUROLOGIC: Cranial nerves 2-12 grossly intact. Moves all extremities no tremor.  Patient Active Problem List   Diagnosis Date Noted  . Nursing home resident 05/30/2013  . Abdominal pain, chronic, right upper quadrant 05/21/2013  . Bright red blood per rectum 05/21/2013  . Anemia of chronic disease 05/21/2013  . Thrombocytopenia, unspecified 05/21/2013  . Cirrhosis of liver 05/21/2013  . Frequent falls 05/06/2013  . Depression 05/06/2013  . Hepatitis C 04/17/2013  . Dyspnea 04/08/2013  . Hypokalemia 04/08/2013  . Elevated liver enzymes 04/07/2013  . Elevated alkaline phosphatase level 04/07/2013  . Hyponatremia 04/07/2013  . Alcohol dependence 04/07/2013  . Elevated bilirubin 04/07/2013  . Edema of both legs 04/07/2013  . Sinusitis, chronic 02/28/2013  . Tachycardia 12/03/2011  . Hot flashes, menopausal 10/14/2010  . Morbid obesity 07/22/2010  . ALLERGIC RHINITIS 07/22/2010  . DEGENERATIVE JOINT DISEASE, BOTH KNEES, SEVERE 03/05/2010  . Essential hypertension, benign 08/31/2009  . OSTEOPENIA 08/24/2009  . ASTHMA, INTERMITTENT 08/17/2009  . BACK PAIN, CHRONIC 08/17/2009    CBC    Component Value Date/Time   WBC 6.8 05/21/2013 1033   RBC 3.59* 05/21/2013 1033   RBC 3.28* 04/07/2013 2025   HGB 10.5* 05/21/2013 1033   HCT 31.1* 05/21/2013 1033   PLT 97* 05/21/2013 1033   MCV 86.6 05/21/2013 1033   LYMPHSABS 2.2 04/07/2013 1542   MONOABS 0.7 04/07/2013 1542   EOSABS 0.1 04/07/2013 1542   BASOSABS 0.1 04/07/2013 1542    CMP     Component  Value Date/Time   NA 131* 05/20/2013 0500   K 3.4* 05/20/2013 0500   CL 97 05/20/2013 0500   CO2 29 05/20/2013 0500   GLUCOSE 85 05/20/2013 0500   BUN 6 05/20/2013 0500   CREATININE 0.75 05/20/2013 0500   CREATININE 0.75 04/17/2013 0955   CALCIUM 8.5 05/20/2013 0500   PROT 8.3 05/20/2013 0500   ALBUMIN 1.8* 05/20/2013 0500   AST 78* 05/20/2013 0500  ALT 28 05/20/2013 0500   ALKPHOS 143* 05/20/2013 0500   BILITOT 4.5* 05/20/2013 0500   GFRNONAA >90 05/20/2013 0500   GFRAA >90 05/20/2013 0500    Assessment and Plan  Pt is stable on all her medications and is being discharged to home with OT/PT, an aid and O2 2L [place] for resting O2 of 69%. Pt has already arranged to see her PCP.  Margit Hanks, MD

## 2013-06-06 ENCOUNTER — Telehealth: Payer: Self-pay | Admitting: Family Medicine

## 2013-06-06 ENCOUNTER — Encounter: Payer: Self-pay | Admitting: Internal Medicine

## 2013-06-06 DIAGNOSIS — M1710 Unilateral primary osteoarthritis, unspecified knee: Secondary | ICD-10-CM

## 2013-06-06 DIAGNOSIS — M545 Low back pain: Secondary | ICD-10-CM

## 2013-06-06 DIAGNOSIS — R531 Weakness: Secondary | ICD-10-CM

## 2013-06-06 DIAGNOSIS — IMO0001 Reserved for inherently not codable concepts without codable children: Secondary | ICD-10-CM

## 2013-06-06 DIAGNOSIS — I10 Essential (primary) hypertension: Secondary | ICD-10-CM

## 2013-06-06 NOTE — Telephone Encounter (Signed)
Attempted to call number provided. No answer.  Will attempt again later.  Merilynn Haydu M. Keyunna Coco, M.D.

## 2013-06-06 NOTE — Telephone Encounter (Signed)
Maurine Minister from [facility] called and would like verbal orders for skilled nursing for [facility]. Bonita Quin can call Maurine Minister at [phone] jw

## 2013-06-10 NOTE — Telephone Encounter (Signed)
Order for skilled nursing in chart, thanks!  Ezequias Lard M. Tonae Livolsi, M.D.

## 2013-06-10 NOTE — Telephone Encounter (Signed)
Please call [facility] and give order for skilled nursing. I have not been able to reach them. She had face-to-face in the hospital which should still be valid for insurance.  Thanks, [employer]. Georgios Kina, M.D.

## 2013-06-10 NOTE — Telephone Encounter (Signed)
Since we have not been able to contact Maurine Minister (i just tried again) i would suggest putting an order in chart and I will fax it and the face to face to [facility].  Let me know what you think. Dionis Autry, Maryjo [place]

## 2013-06-10 NOTE — Telephone Encounter (Signed)
Order and face to face faxed. Fleeger, Maryjo Rochester

## 2013-06-13 ENCOUNTER — Ambulatory Visit (HOSPITAL_BASED_OUTPATIENT_CLINIC_OR_DEPARTMENT_OTHER): Payer: Medicare Other | Attending: Family Medicine

## 2013-06-13 VITALS — Ht 68.0 in | Wt 272.0 lb

## 2013-06-13 DIAGNOSIS — E669 Obesity, unspecified: Secondary | ICD-10-CM

## 2013-06-13 DIAGNOSIS — G4733 Obstructive sleep apnea (adult) (pediatric): Secondary | ICD-10-CM

## 2013-06-13 DIAGNOSIS — I10 Essential (primary) hypertension: Secondary | ICD-10-CM

## 2013-06-13 DIAGNOSIS — G4761 Periodic limb movement disorder: Secondary | ICD-10-CM | POA: Insufficient documentation

## 2013-06-21 NOTE — Discharge Summary (Signed)
Family Medicine Teaching Service  Discharge Note : Attending Jeff Karianna Gusman MD Pager [phone] Inpatient Team Pager:  [phone]  I have reviewed this patient and the patient's chart and have discussed discharge planning with the resident at the time of discharge. I agree with the discharge plan as above.    

## 2013-06-26 ENCOUNTER — Encounter: Payer: Self-pay | Admitting: Family Medicine

## 2013-06-26 ENCOUNTER — Ambulatory Visit (INDEPENDENT_AMBULATORY_CARE_PROVIDER_SITE_OTHER): Payer: Medicare Other | Admitting: Family Medicine

## 2013-06-26 VITALS — BP 80/48 | HR 83 | Temp 98.6°F

## 2013-06-26 DIAGNOSIS — K7460 Unspecified cirrhosis of liver: Secondary | ICD-10-CM

## 2013-06-26 MED ORDER — ALBUTEROL SULFATE HFA 108 (90 BASE) MCG/ACT IN AERS: 2.0000 | INHALATION_SPRAY | Freq: Four times a day (QID) | RESPIRATORY_TRACT | Status: AC | PRN

## 2013-06-26 MED ORDER — OXYCODONE HCL 5 MG PO TABS: 5.0000 mg | ORAL_TABLET | Freq: Three times a day (TID) | ORAL | Status: DC | PRN

## 2013-06-26 MED ORDER — VENLAFAXINE HCL 37.5 MG PO TABS: 37.5000 mg | ORAL_TABLET | Freq: Two times a day (BID) | ORAL | Status: DC

## 2013-06-26 MED ORDER — SPIRONOLACTONE 100 MG PO TABS: 100.0000 mg | ORAL_TABLET | Freq: Every day | ORAL | Status: DC

## 2013-06-26 MED ORDER — POTASSIUM CHLORIDE CRYS ER 20 MEQ PO TBCR: 20.0000 meq | EXTENDED_RELEASE_TABLET | Freq: Every day | ORAL | Status: DC

## 2013-06-26 MED ORDER — OXYCODONE HCL 10 MG PO TABS: 10.0000 mg | ORAL_TABLET | Freq: Three times a day (TID) | ORAL | Status: DC | PRN

## 2013-06-26 MED ORDER — LACTULOSE 10 GM/15ML PO SOLN: 20.0000 g | Freq: Two times a day (BID) | ORAL | Status: DC

## 2013-06-26 MED ORDER — NUTRITIONAL SHAKE PO LIQD: 1.0000 | Freq: Three times a day (TID) | ORAL | Status: AC

## 2013-06-26 MED ORDER — FUROSEMIDE 20 MG PO TABS: 10.0000 mg | ORAL_TABLET | Freq: Every day | ORAL | Status: DC

## 2013-06-26 MED ORDER — PROPRANOLOL HCL 20 MG PO TABS: 20.0000 mg | ORAL_TABLET | Freq: Three times a day (TID) | ORAL | Status: DC

## 2013-06-26 NOTE — Patient Instructions (Signed)
Cut back on your Lasix to 1/2 tab daily for right now. Let me know if you get a lot more fluid. I have changed your Propranolol dose to 20mg  per day. You should not take HCTZ any longer.  Please come back in one month, or sooner if needed.  Hermann Dottavio M. Bobbiejo Ishikawa, M.D.

## 2013-06-26 NOTE — Progress Notes (Signed)
Patient ID: Colleen Olson, female   DOB: 1961/03/24, 52 y.o.   MRN: [id number]    Subjective: HPI: Patient is a 52 y.o. female presenting to clinic today for follow up appointment.  Patient is back home now and living with her daughter. She is doing well, and daughter Gwendel Hanson. Has home health agency working with her for PT/OT. She states that she has some delusions at night. She does report some dizziness, especially when changing positions. (Her BP is low in clinic today.) She was recently seen by hepatology and agree to continue all treatment for now and follow up on Dec 8. She also had a sleep study, but this has not resulted yet.  Patient's main concern today is refilling all medication. She also needs nutritional shake Rx.   History Reviewed: Non-smoker. Health Maintenance: UTD, needs Cmet today.  ROS: Please see HPI above.  Objective: Office vital signs reviewed. BP 80/48  Pulse 83  Temp(Src) 98.6 F (37 C) (Oral)  SpO2 93%  Physical Examination:  General: Awake, alert. NAD. In wheelchair. Daughter at bedside HEENT: Atraumatic, normocephalic. [place] in place. MMM. Missing front teeth Neck: No masses palpated. No LAD Pulm: CTAB, no wheezes Cardio: RRR, no murmurs appreciated Abdomen: Obese, +BS, soft, nontender, nondistended. Ascites much improved Extremities: Mild trace edema  Assessment: 53 y.o. female following  Plan: See Problem List and After Visit Summary

## 2013-06-26 NOTE — Addendum Note (Signed)
Addended by: Leary Roca on: 06/26/2013 03:44 AM   Modules accepted: Level of Service

## 2013-06-26 NOTE — Assessment & Plan Note (Signed)
End stage liver disease. Now with hypotension. Need to find a balance between fluid and blood pressure. She does not appear fluid overloaded today. -Check Cmet -Change Lasix from 40mg  to 10mg  for now, then likely increase to 20mg  in one month -Reminded to stop HCTZ (stopped many months ago) -Propranolol to 20mg  daily for varices ppx -Con't spiro as ordered - F/u in one month, or sooner if needed.  Given Rx for nutritional shakes.

## 2013-06-27 ENCOUNTER — Telehealth: Payer: Self-pay | Admitting: Family Medicine

## 2013-06-27 ENCOUNTER — Other Ambulatory Visit: Payer: Self-pay | Admitting: Family Medicine

## 2013-06-27 DIAGNOSIS — N179 Acute kidney failure, unspecified: Secondary | ICD-10-CM

## 2013-06-27 LAB — COMPREHENSIVE METABOLIC PANEL
ALT: 27 U/L (ref 0–35)
AST: 66 U/L — ABNORMAL HIGH (ref 0–37)
Albumin: 2 g/dL — ABNORMAL LOW (ref 3.5–5.2)
BUN: 12 mg/dL (ref 6–23)
CO2: 27 mEq/L (ref 19–32)
Calcium: 8.4 mg/dL (ref 8.4–10.5)
Chloride: 96 mEq/L (ref 96–112)
Creat: 1.83 mg/dL — ABNORMAL HIGH (ref 0.50–1.10)
Glucose, Bld: 106 mg/dL — ABNORMAL HIGH (ref 70–99)
Potassium: 3.8 mEq/L (ref 3.5–5.3)
Total Bilirubin: 3.5 mg/dL — ABNORMAL HIGH (ref 0.3–1.2)

## 2013-06-27 NOTE — Telephone Encounter (Signed)
Called patient and her daughter to let them know about increase in creatinine.  Patient's daughter reports that she currently is not having any swelling. I therefore recommended that she stop the lasix all together. She is on aldactone 100mg  and propanolol ER 60 for ascites in setting of liver cirrhosis. Hesitate to stop these since they are maintenance meds for her cirrhosis and ascites.  Recommended labwork to be repeated on Monday. Reviewed red flags for going to the ED: shortness of breath, nausea, vomiting, not able to keep fluids down, fever, change in mental status.  Her daughter expressed understanding and agreed with plan.   Marena Chancy, PGY-3 Family Medicine Resident

## 2013-06-27 NOTE — Telephone Encounter (Signed)
[facility] Emergency Line:   Received call from [facility] with critical value for albumin of 2.0. Reviewed her records and see that she has had low albumin in the past ranging between 1.8 and 2.4. More notable on lab review is her creatinine of 1.83, increased from 0.75. Reviewed note from PCP yesterday where lasix was decreased and hctz stopped. Acute renal failure likely from hypotension, which should correct with decreased antihypertensive agents. Will forward to PCP and see if she would like to obtain follow up labs next week.   Marena Chancy, PGY-3 Family Medicine Resident

## 2013-06-29 DIAGNOSIS — G4733 Obstructive sleep apnea (adult) (pediatric): Secondary | ICD-10-CM

## 2013-06-29 DIAGNOSIS — E669 Obesity, unspecified: Secondary | ICD-10-CM

## 2013-06-29 DIAGNOSIS — I10 Essential (primary) hypertension: Secondary | ICD-10-CM

## 2013-06-29 NOTE — Procedures (Signed)
NAME:  Colleen Olson, Colleen Olson            ACCOUNT NO.:  [id number]  MEDICAL RECORD NO.:  [id number]          PATIENT TYPE:  OUT  LOCATION:  SLEEP CENTER                 FACILITY:  [facility]  PHYSICIAN:  Curties Conigliaro D. Maple Hudson, MD, FCCP, FACPDATE OF BIRTH:  12-27-58  DATE OF STUDY:  06/13/2013                           NOCTURNAL POLYSOMNOGRAM  REFERRING PHYSICIAN:  Paula Compton, MD  INDICATION FOR STUDY:  Hypersomnia with sleep apnea.  EPWORTH SLEEPINESS SCORE:  11/24.  BMI 42.3, weight 278 pounds, height 68 inches, neck 16 inches.  MEDICATIONS:  Home medications are charted for review.  SLEEP ARCHITECTURE:  Total sleep time 323.5 minutes, with sleep efficiency 77.9%.  Stage I was 18.5%, stage II 74.3%, stage III 6%, REM 1.1% of total sleep time.  Sleep latency 7 minutes, REM latency 284 minutes.  Awake after sleep onset 86 minutes.  Arousal index 45.4. Bedtime medication:  None.  Sleep architecture was marked by very frequent brief awakenings throughout the night.  RESPIRATORY DATA:  Apnea-hypopnea index (AHI) 0.4 per hour.  A total of 2 events were scored as hypopneas in supine sleep position.  REM AHI 0. There were not enough events to permit application of split protocol CPAP titration.  OXYGEN DATA:  Very loud snoring.  Persistent coughing.  The patient wore supplemental oxygen at 2 L/minute throughout the study with oxygen desaturation to a nadir of 80% and mean oxygen saturation through the study of 93.3%.  CARDIAC DATA:  Normal sinus rhythm.  MOVEMENT-PARASOMNIA:  A total of 151 limb jerks were counted, of which 56 were associated with arousals or awakenings for periodic limb movements with arousal index of 10.4 per hour.  Bathroom x1.  IMPRESSIONS-RECOMMENDATIONS: 1. Markedly fragmented sleep throughout the night.  Consider treating     as insomnia. 2. Occasional respiratory event with sleep disturbance, within normal     limits.  AHI 0.4 per hour (the normal range for adults is  an AHI     between 0 and 5 events per hour).  Very loud snoring with oxygen     desaturation to a nadir of 80% and mean oxygen saturation through     the study of 93.3% while wearing supplemental oxygen at 2 L/minute     throughout the study. 3. Periodic limb movement with arousal syndrome.  A total of 151 limb     jerks were counted of which 56 were     associated with arousals or awakenings for periodic limb movement     with arousal index of 10.4 per hour.  She may benefit from a trial     of specific therapy such as ReQuip or Mirapex if clinically     appropriate.     [name], MD, Tonny Bollman, FACP Diplomate, American Board of Sleep Medicine    CDY/MEDQ  D:  06/29/2013 11:49:23  T:  06/29/2013 13:31:59  Job:  [id number]

## 2013-07-01 ENCOUNTER — Other Ambulatory Visit: Payer: Medicare Other

## 2013-07-01 DIAGNOSIS — N179 Acute kidney failure, unspecified: Secondary | ICD-10-CM

## 2013-07-01 LAB — BASIC METABOLIC PANEL
CO2: 26 mEq/L (ref 19–32)
Calcium: 8.9 mg/dL (ref 8.4–10.5)
Chloride: 97 mEq/L (ref 96–112)
Creat: 1.38 mg/dL — ABNORMAL HIGH (ref 0.50–1.10)
Potassium: 4 mEq/L (ref 3.5–5.3)
Sodium: 130 mEq/L — ABNORMAL LOW (ref 135–145)

## 2013-07-01 NOTE — Progress Notes (Signed)
BMP DONE TODAY Colleen Olson 

## 2013-07-16 ENCOUNTER — Emergency Department (HOSPITAL_COMMUNITY): Payer: Medicare Other

## 2013-07-16 ENCOUNTER — Encounter (HOSPITAL_COMMUNITY): Payer: Self-pay | Admitting: Emergency Medicine

## 2013-07-16 ENCOUNTER — Emergency Department (HOSPITAL_COMMUNITY)
Admission: EM | Admit: 2013-07-16 | Discharge: 2013-07-16 | Disposition: A | Discharge status: Home / Self Care | Payer: Medicare Other | Source: Home / Self Care | Attending: Emergency Medicine | Admitting: Emergency Medicine

## 2013-07-16 ENCOUNTER — Telehealth (HOSPITAL_BASED_OUTPATIENT_CLINIC_OR_DEPARTMENT_OTHER): Payer: Self-pay

## 2013-07-16 DIAGNOSIS — Z96659 Presence of unspecified artificial knee joint: Secondary | ICD-10-CM | POA: Insufficient documentation

## 2013-07-16 DIAGNOSIS — M545 Low back pain, unspecified: Secondary | ICD-10-CM | POA: Insufficient documentation

## 2013-07-16 DIAGNOSIS — Z862 Personal history of diseases of the blood and blood-forming organs and certain disorders involving the immune mechanism: Secondary | ICD-10-CM | POA: Insufficient documentation

## 2013-07-16 DIAGNOSIS — G8929 Other chronic pain: Secondary | ICD-10-CM | POA: Insufficient documentation

## 2013-07-16 DIAGNOSIS — M1710 Unilateral primary osteoarthritis, unspecified knee: Secondary | ICD-10-CM | POA: Insufficient documentation

## 2013-07-16 DIAGNOSIS — I209 Angina pectoris, unspecified: Secondary | ICD-10-CM | POA: Insufficient documentation

## 2013-07-16 DIAGNOSIS — IMO0002 Reserved for concepts with insufficient information to code with codable children: Secondary | ICD-10-CM | POA: Insufficient documentation

## 2013-07-16 DIAGNOSIS — Z8639 Personal history of other endocrine, nutritional and metabolic disease: Secondary | ICD-10-CM | POA: Insufficient documentation

## 2013-07-16 DIAGNOSIS — I10 Essential (primary) hypertension: Secondary | ICD-10-CM | POA: Insufficient documentation

## 2013-07-16 DIAGNOSIS — M25562 Pain in left knee: Secondary | ICD-10-CM

## 2013-07-16 DIAGNOSIS — J45909 Unspecified asthma, uncomplicated: Secondary | ICD-10-CM | POA: Insufficient documentation

## 2013-07-16 DIAGNOSIS — Z79899 Other long term (current) drug therapy: Secondary | ICD-10-CM | POA: Insufficient documentation

## 2013-07-16 MED ORDER — CYCLOBENZAPRINE HCL 10 MG PO TABS: 10.0000 mg | ORAL_TABLET | Freq: Two times a day (BID) | ORAL | Status: AC | PRN

## 2013-07-16 MED ORDER — SODIUM CHLORIDE 0.9 % IV BOLUS (SEPSIS): 1000.0000 mL | Freq: Once | INTRAVENOUS | Status: DC

## 2013-07-16 MED ORDER — OXYCODONE-ACETAMINOPHEN 5-325 MG PO TABS: 2.0000 | ORAL_TABLET | ORAL | Status: DC | PRN

## 2013-07-16 MED ORDER — MORPHINE SULFATE 4 MG/ML IJ SOLN
4.0000 mg | Freq: Once | INTRAMUSCULAR | Status: AC
  Administered 2013-07-16: 4 mg via INTRAMUSCULAR
  Filled 2013-07-16: qty 1

## 2013-07-16 MED ORDER — OXYCODONE-ACETAMINOPHEN 5-325 MG PO TABS
2.0000 | ORAL_TABLET | Freq: Once | ORAL | Status: AC
  Administered 2013-07-16: 2 via ORAL
  Filled 2013-07-16: qty 2

## 2013-07-16 NOTE — ED Notes (Signed)
Patient presents with c/o left knee pain.  Denies falling

## 2013-07-16 NOTE — ED Provider Notes (Signed)
CSN: [id number]     Arrival date & time 07/16/13  7829 History   First MD Initiated Contact with Patient 07/16/13 [id number]     Chief Complaint  Patient presents with  . Knee Pain   (Consider location/radiation/quality/duration/timing/severity/associated sxs/prior Treatment) HPI Comments: Patient is a 52 year old female with a past medical history of hypertension, osteoarthritis of bilateral knees, chronic low back pain, and asthma who presents with left knee pain that started suddenly in the middle of the night. The pain is sharp and severe and radiates up and down her entire leg. Patient denies any injury or other symptoms. Patient did not try anything for pain relief at home. Movement and palpation of the knee makes the pain worse. No alleviating factors.    Past Medical History  Diagnosis Date  . Asthma   . Hypertension   . Angina   . Osteoarthritis of both knees   . Exertional dyspnea 12/01/11  . Borderline diabetes   . Headache(784.0) 12/02/11    "sometimes when I wake up in am; real bad"  . Arthritis   . Chronic lower back pain   . Tachycardia    Past Surgical History  Procedure Laterality Date  . Joint replacement    . Total knee arthroplasty  03/02/2011    left  . Tonsillectomy      "I was a little girl"  . Tubal ligation  1982   Family History  Problem Relation Age of Onset  . Diabetes Mother   . Diabetes Father   . Diabetes Sister   . Diabetes Brother    History  Substance Use Topics  . Smoking status: Never Smoker   . Smokeless tobacco: Never Used     Comment: 12/02/11 "around second hand smoke"  . Alcohol Use: Yes     Comment: 3-5 40oz. beers per day   OB History   Grav Para Term Preterm Abortions TAB SAB Ect Mult Living                 Review of Systems  Constitutional: Negative for fever, chills and fatigue.  HENT: Negative for trouble swallowing.   Eyes: Negative for visual disturbance.  Respiratory: Negative for shortness of breath.   Cardiovascular:  Negative for chest pain and palpitations.  Gastrointestinal: Negative for nausea, vomiting, abdominal pain and diarrhea.  Genitourinary: Negative for dysuria and difficulty urinating.  Musculoskeletal: Positive for arthralgias. Negative for neck pain.  Skin: Negative for color change.  Neurological: Negative for dizziness and weakness.  Psychiatric/Behavioral: Negative for dysphoric mood.    Allergies  Review of patient's allergies indicates no known allergies.  Home Medications   Current Outpatient Rx  Name  Route  Sig  Dispense  Refill  . albuterol (PROAIR HFA) 108 (90 BASE) MCG/ACT inhaler   Inhalation   Inhale 2 puffs into the lungs every 6 (six) hours as needed for wheezing or shortness of breath.   6.7 g   11   . calcium carbonate (OS-CAL - DOSED IN MG OF ELEMENTAL CALCIUM) 1250 MG tablet   Oral   Take 1 tablet by mouth daily.         . cetirizine (ZYRTEC) 10 MG tablet   Oral   Take 10 mg by mouth daily.         . fluticasone (FLONASE) 50 MCG/ACT nasal spray   Nasal   Place 2 sprays into the nose daily.         . furosemide (LASIX) 20 MG  tablet   Oral   Take 0.5 tablets (10 mg total) by mouth daily.   30 tablet   5   . lactulose (CHRONULAC) 10 GM/15ML solution   Oral   Take 30 mLs (20 g total) by mouth 2 (two) times daily.   240 mL   0   . Nutritional Supplements (NUTRITIONAL SHAKE) LIQD   Oral   Take 1 each by mouth 3 (three) times daily.   90 Bottle   5   . Oxycodone HCl 10 MG TABS   Oral   Take 1 tablet (10 mg total) by mouth 3 (three) times daily as needed.   90 tablet   0   . pentoxifylline (TRENTAL) 400 MG CR tablet   Oral   Take 400 mg by mouth 3 (three) times daily with meals.         . polyethylene glycol (MIRALAX / GLYCOLAX) packet   Oral   Take 17 g by mouth daily.   14 each   0   . potassium chloride SA (K-DUR,KLOR-CON) 20 MEQ tablet   Oral   Take 1 tablet (20 mEq total) by mouth daily.   30 tablet   5   . propranolol  (INDERAL) 20 MG tablet   Oral   Take 1 tablet (20 mg total) by mouth 3 (three) times daily.   30 tablet   5   . senna (SENOKOT) 8.6 MG TABS tablet   Oral   Take 1 tablet (8.6 mg total) by mouth 2 (two) times daily.   120 each   0   . spironolactone (ALDACTONE) 100 MG tablet   Oral   Take 1 tablet (100 mg total) by mouth daily.   30 tablet   5   . venlafaxine (EFFEXOR) 37.5 MG tablet   Oral   Take 1 tablet (37.5 mg total) by mouth 2 (two) times daily.   30 tablet   5    BP 136/82  Pulse 91  Temp(Src) 98.5 F (36.9 C) (Oral)  Resp 20  SpO2 98% Physical Exam  Nursing note and vitals reviewed. Constitutional: She is oriented to person, place, and time. She appears well-developed and well-nourished. No distress.  Patient is morbidly obese  HENT:  Head: Normocephalic and atraumatic.  Eyes: Conjunctivae and EOM are normal.  Neck: Normal range of motion.  Cardiovascular: Normal rate and regular rhythm.  Exam reveals no gallop and no friction rub.   No murmur heard. Pulmonary/Chest: Effort normal and breath sounds normal. She has no wheezes. She has no rales. She exhibits no tenderness.  Abdominal: Soft. She exhibits no distension. There is no tenderness. There is no rebound and no guarding.  Musculoskeletal: Normal range of motion.  No calf tenderness to palpation bilaterally. Anterior left knee tenderness to palpation. Full ROM. No obvious deformity.   Neurological: She is alert and oriented to person, place, and time. Coordination normal.  Speech is goal-oriented. Moves limbs without ataxia.   Skin: Skin is warm and dry.  Psychiatric: She has a normal mood and affect. Her behavior is normal.    ED Course  Procedures (including critical care time) Labs Review Labs Reviewed - No data to display Imaging Review Dg Knee 2 Views Left  07/16/2013   CLINICAL DATA:  Anterior knee pain  EXAM: LEFT KNEE - 1-2 VIEW  COMPARISON:  11/30/2012  FINDINGS: Three views of the left  knee submitted. No acute fracture or subluxation. Medial compartment prosthesis is stable. No definite evidence  of loosening. There is mild spurring of lateral femoral condyle. Narrowing of patellofemoral joint space. Small joint effusion.  IMPRESSION: No acute fracture or subluxation. Stable postsurgical changes. Mild degenerative changes.   Electronically Signed   By: Natasha Mead M.D.   On: 07/16/2013 07:34    EKG Interpretation   None       MDM   1. Knee pain, left     6:52 AM Knee xray pending. Patient's BP was slightly low when I was in the room. I rechecked the BP and it was 83/46. I ordered the patient fluids and pain medication.   10:56 AM Patient's BP has improved. Xray of knee unremarkable for acute changes. The area does not appear to be infected, as there is not warmth or erythema. Patient is distractible with her pain complaint, as she stops moaning when conversation is started. I suspect there is an element of drama with the patient. She will be discharged with recommended follow up with her Orthopedic doctor.     Emilia Beck, PA-C 07/16/13 1130

## 2013-07-16 NOTE — ED Notes (Signed)
Patient said she woke up this morning and began to experience pain from her left knee and she feels it all the way down to her foot.  The patient said she woke up in severe pain.  Leg is cool to the touch, does not appear to be swollen.  She advise me she takes oxycodone 10mg  and it did not do anything for her pain.

## 2013-07-16 NOTE — ED Notes (Signed)
Patient taken ice pack per request.

## 2013-07-17 NOTE — ED Provider Notes (Signed)
Medical screening examination/treatment/procedure(s) were performed by non-physician practitioner and as supervising physician I was immediately available for consultation/collaboration.   Korayma Hagwood, MD 07/17/13 0552 

## 2013-07-18 ENCOUNTER — Inpatient Hospital Stay (HOSPITAL_COMMUNITY): Payer: Medicare Other

## 2013-07-18 ENCOUNTER — Inpatient Hospital Stay (HOSPITAL_COMMUNITY)
Admission: EM | Admit: 2013-07-18 | Discharge: 2013-07-31 | DRG: 871 | Disposition: A | Discharge status: Hospice | Payer: Medicare Other | Attending: Family Medicine | Admitting: Family Medicine

## 2013-07-18 ENCOUNTER — Encounter (HOSPITAL_COMMUNITY): Payer: Self-pay | Admitting: Emergency Medicine

## 2013-07-18 ENCOUNTER — Emergency Department (HOSPITAL_COMMUNITY): Payer: Medicare Other

## 2013-07-18 DIAGNOSIS — R040 Epistaxis: Secondary | ICD-10-CM | POA: Diagnosis present

## 2013-07-18 DIAGNOSIS — R4182 Altered mental status, unspecified: Secondary | ICD-10-CM | POA: Diagnosis present

## 2013-07-18 DIAGNOSIS — E119 Type 2 diabetes mellitus without complications: Secondary | ICD-10-CM | POA: Diagnosis present

## 2013-07-18 DIAGNOSIS — D509 Iron deficiency anemia, unspecified: Secondary | ICD-10-CM | POA: Diagnosis present

## 2013-07-18 DIAGNOSIS — K922 Gastrointestinal hemorrhage, unspecified: Secondary | ICD-10-CM | POA: Diagnosis present

## 2013-07-18 DIAGNOSIS — M1710 Unilateral primary osteoarthritis, unspecified knee: Secondary | ICD-10-CM

## 2013-07-18 DIAGNOSIS — D638 Anemia in other chronic diseases classified elsewhere: Secondary | ICD-10-CM | POA: Diagnosis present

## 2013-07-18 DIAGNOSIS — R0902 Hypoxemia: Secondary | ICD-10-CM | POA: Diagnosis present

## 2013-07-18 DIAGNOSIS — R531 Weakness: Secondary | ICD-10-CM

## 2013-07-18 DIAGNOSIS — K7689 Other specified diseases of liver: Secondary | ICD-10-CM | POA: Diagnosis present

## 2013-07-18 DIAGNOSIS — K7682 Hepatic encephalopathy: Secondary | ICD-10-CM | POA: Diagnosis present

## 2013-07-18 DIAGNOSIS — E872 Acidosis, unspecified: Secondary | ICD-10-CM | POA: Diagnosis not present

## 2013-07-18 DIAGNOSIS — K769 Liver disease, unspecified: Secondary | ICD-10-CM | POA: Diagnosis present

## 2013-07-18 DIAGNOSIS — R600 Localized edema: Secondary | ICD-10-CM

## 2013-07-18 DIAGNOSIS — K5900 Constipation, unspecified: Secondary | ICD-10-CM | POA: Diagnosis present

## 2013-07-18 DIAGNOSIS — A419 Sepsis, unspecified organism: Secondary | ICD-10-CM | POA: Diagnosis present

## 2013-07-18 DIAGNOSIS — E870 Hyperosmolality and hypernatremia: Secondary | ICD-10-CM | POA: Diagnosis present

## 2013-07-18 DIAGNOSIS — F3289 Other specified depressive episodes: Secondary | ICD-10-CM | POA: Diagnosis present

## 2013-07-18 DIAGNOSIS — Z515 Encounter for palliative care: Secondary | ICD-10-CM

## 2013-07-18 DIAGNOSIS — N390 Urinary tract infection, site not specified: Secondary | ICD-10-CM | POA: Diagnosis present

## 2013-07-18 DIAGNOSIS — Z66 Do not resuscitate: Secondary | ICD-10-CM | POA: Diagnosis not present

## 2013-07-18 DIAGNOSIS — Z833 Family history of diabetes mellitus: Secondary | ICD-10-CM

## 2013-07-18 DIAGNOSIS — R7881 Bacteremia: Secondary | ICD-10-CM

## 2013-07-18 DIAGNOSIS — R748 Abnormal levels of other serum enzymes: Secondary | ICD-10-CM

## 2013-07-18 DIAGNOSIS — R52 Pain, unspecified: Secondary | ICD-10-CM

## 2013-07-18 DIAGNOSIS — R791 Abnormal coagulation profile: Secondary | ICD-10-CM | POA: Diagnosis present

## 2013-07-18 DIAGNOSIS — K7460 Unspecified cirrhosis of liver: Secondary | ICD-10-CM | POA: Diagnosis present

## 2013-07-18 DIAGNOSIS — K7290 Hepatic failure, unspecified without coma: Secondary | ICD-10-CM

## 2013-07-18 DIAGNOSIS — G9340 Encephalopathy, unspecified: Secondary | ICD-10-CM

## 2013-07-18 DIAGNOSIS — G8929 Other chronic pain: Secondary | ICD-10-CM | POA: Diagnosis present

## 2013-07-18 DIAGNOSIS — M545 Low back pain, unspecified: Secondary | ICD-10-CM | POA: Diagnosis present

## 2013-07-18 DIAGNOSIS — J45909 Unspecified asthma, uncomplicated: Secondary | ICD-10-CM | POA: Diagnosis present

## 2013-07-18 DIAGNOSIS — E871 Hypo-osmolality and hyponatremia: Secondary | ICD-10-CM

## 2013-07-18 DIAGNOSIS — N179 Acute kidney failure, unspecified: Secondary | ICD-10-CM

## 2013-07-18 DIAGNOSIS — Z9981 Dependence on supplemental oxygen: Secondary | ICD-10-CM

## 2013-07-18 DIAGNOSIS — A4102 Sepsis due to Methicillin resistant Staphylococcus aureus: Principal | ICD-10-CM | POA: Diagnosis present

## 2013-07-18 DIAGNOSIS — Z96659 Presence of unspecified artificial knee joint: Secondary | ICD-10-CM

## 2013-07-18 DIAGNOSIS — Z6841 Body Mass Index (BMI) 40.0 and over, adult: Secondary | ICD-10-CM

## 2013-07-18 DIAGNOSIS — R17 Unspecified jaundice: Secondary | ICD-10-CM | POA: Diagnosis present

## 2013-07-18 DIAGNOSIS — E875 Hyperkalemia: Secondary | ICD-10-CM | POA: Diagnosis present

## 2013-07-18 DIAGNOSIS — A498 Other bacterial infections of unspecified site: Secondary | ICD-10-CM | POA: Diagnosis present

## 2013-07-18 DIAGNOSIS — K767 Hepatorenal syndrome: Secondary | ICD-10-CM | POA: Diagnosis present

## 2013-07-18 DIAGNOSIS — F329 Major depressive disorder, single episode, unspecified: Secondary | ICD-10-CM | POA: Diagnosis present

## 2013-07-18 DIAGNOSIS — I10 Essential (primary) hypertension: Secondary | ICD-10-CM | POA: Diagnosis present

## 2013-07-18 DIAGNOSIS — Z79899 Other long term (current) drug therapy: Secondary | ICD-10-CM

## 2013-07-18 DIAGNOSIS — R296 Repeated falls: Secondary | ICD-10-CM

## 2013-07-18 DIAGNOSIS — M899 Disorder of bone, unspecified: Secondary | ICD-10-CM | POA: Diagnosis present

## 2013-07-18 DIAGNOSIS — K625 Hemorrhage of anus and rectum: Secondary | ICD-10-CM

## 2013-07-18 DIAGNOSIS — D696 Thrombocytopenia, unspecified: Secondary | ICD-10-CM | POA: Diagnosis present

## 2013-07-18 DIAGNOSIS — F1020 Alcohol dependence, uncomplicated: Secondary | ICD-10-CM | POA: Diagnosis present

## 2013-07-18 HISTORY — DX: Dependence on supplemental oxygen: Z99.81

## 2013-07-18 HISTORY — DX: Unspecified cirrhosis of liver: K74.60

## 2013-07-18 HISTORY — DX: Type 2 diabetes mellitus without complications: E11.9

## 2013-07-18 HISTORY — DX: Shortness of breath: R06.02

## 2013-07-18 HISTORY — DX: Acute kidney failure, unspecified: N17.9

## 2013-07-18 HISTORY — DX: Major depressive disorder, single episode, unspecified: F32.9

## 2013-07-18 HISTORY — DX: Anxiety disorder, unspecified: F41.9

## 2013-07-18 HISTORY — DX: Depression, unspecified: F32.A

## 2013-07-18 HISTORY — DX: Altered mental status, unspecified: R41.82

## 2013-07-18 HISTORY — DX: Pure hypercholesterolemia, unspecified: E78.00

## 2013-07-18 LAB — URINE MICROSCOPIC-ADD ON

## 2013-07-18 LAB — CBC
HCT: 22.8 % — ABNORMAL LOW (ref 36.0–46.0)
Hemoglobin: 8.1 g/dL — ABNORMAL LOW (ref 12.0–15.0)
MCH: 27.4 pg (ref 26.0–34.0)
MCHC: 35.5 g/dL (ref 30.0–36.0)
MCV: 77 fL — ABNORMAL LOW (ref 78.0–100.0)
Platelets: 121 10*3/uL — ABNORMAL LOW (ref 150–400)
RDW: 17.5 % — ABNORMAL HIGH (ref 11.5–15.5)
WBC: 13.7 10*3/uL — ABNORMAL HIGH (ref 4.0–10.5)

## 2013-07-18 LAB — GLUCOSE, CAPILLARY
Glucose-Capillary: 62 mg/dL — ABNORMAL LOW (ref 70–99)
Glucose-Capillary: 80 mg/dL (ref 70–99)
Glucose-Capillary: 69 mg/dL — ABNORMAL LOW (ref 70–99)

## 2013-07-18 LAB — BASIC METABOLIC PANEL
BUN: 31 mg/dL — ABNORMAL HIGH (ref 6–23)
CO2: 23 mEq/L (ref 19–32)
Calcium: 8.3 mg/dL — ABNORMAL LOW (ref 8.4–10.5)
Chloride: 102 mEq/L (ref 96–112)
Creatinine, Ser: 4.84 mg/dL — ABNORMAL HIGH (ref 0.50–1.10)
GFR calc Af Amer: 11 mL/min — ABNORMAL LOW (ref >90)
GFR calc non Af Amer: 9 mL/min — ABNORMAL LOW (ref >90)
Glucose, Bld: 77 mg/dL (ref 70–99)
Potassium: 5.8 mEq/L — ABNORMAL HIGH (ref 3.5–5.1)
Sodium: 134 mEq/L — ABNORMAL LOW (ref 135–145)

## 2013-07-18 LAB — URINALYSIS, ROUTINE W REFLEX MICROSCOPIC
Glucose, UA: NEGATIVE mg/dL
Ketones, ur: 15 mg/dL — AB
Nitrite: POSITIVE — AB
Protein, ur: NEGATIVE mg/dL
Specific Gravity, Urine: 1.026 (ref 1.005–1.030)
pH: 5 (ref 5.0–8.0)

## 2013-07-18 LAB — COMPREHENSIVE METABOLIC PANEL
ALT: 15 U/L (ref 0–35)
AST: 45 U/L — ABNORMAL HIGH (ref 0–37)
Alkaline Phosphatase: 157 U/L — ABNORMAL HIGH (ref 39–117)
Calcium: 8.3 mg/dL — ABNORMAL LOW (ref 8.4–10.5)
Creatinine, Ser: 4.98 mg/dL — ABNORMAL HIGH (ref 0.50–1.10)
GFR calc Af Amer: 10 mL/min — ABNORMAL LOW (ref >90)
Glucose, Bld: 107 mg/dL — ABNORMAL HIGH (ref 70–99)
Sodium: 131 mEq/L — ABNORMAL LOW (ref 135–145)
Total Protein: 8.1 g/dL (ref 6.0–8.3)

## 2013-07-18 LAB — RAPID URINE DRUG SCREEN, HOSP PERFORMED
Amphetamines: NOT DETECTED
Barbiturates: NOT DETECTED
Benzodiazepines: NOT DETECTED
Cocaine: NOT DETECTED
Opiates: POSITIVE — AB
Tetrahydrocannabinol: NOT DETECTED

## 2013-07-18 LAB — TROPONIN I
Troponin I: <0.3 ng/mL (ref <0.30)
Troponin I: <0.3 ng/mL (ref <0.30)

## 2013-07-18 LAB — AMMONIA: Ammonia: 52 umol/L (ref 11–60)

## 2013-07-18 LAB — PROTIME-INR: INR: 1.99 — ABNORMAL HIGH (ref 0.00–1.49)

## 2013-07-18 LAB — SALICYLATE LEVEL: Salicylate Lvl: <2 mg/dL — ABNORMAL LOW (ref 2.8–20.0)

## 2013-07-18 LAB — ACETAMINOPHEN LEVEL: Acetaminophen (Tylenol), Serum: <15 ug/mL (ref 10–30)

## 2013-07-18 LAB — ETHANOL: Alcohol, Ethyl (B): <11 mg/dL (ref 0–11)

## 2013-07-18 MED ORDER — ALBUTEROL SULFATE (5 MG/ML) 0.5% IN NEBU
2.5000 mg | INHALATION_SOLUTION | Freq: Once | RESPIRATORY_TRACT | Status: AC
  Administered 2013-07-18: 2.5 mg via RESPIRATORY_TRACT
  Filled 2013-07-18: qty 0.5

## 2013-07-18 MED ORDER — SODIUM CHLORIDE 0.9 % IV BOLUS (SEPSIS)
1000.0000 mL | Freq: Once | INTRAVENOUS | Status: AC
  Administered 2013-07-18: 1000 mL via INTRAVENOUS

## 2013-07-18 MED ORDER — PROPRANOLOL HCL 20 MG PO TABS
20.0000 mg | ORAL_TABLET | Freq: Three times a day (TID) | ORAL | Status: DC
  Administered 2013-07-19: 20 mg via ORAL
  Filled 2013-07-18 (×7): qty 1

## 2013-07-18 MED ORDER — SODIUM CHLORIDE 0.9 % IV SOLN
INTRAVENOUS | Status: DC
  Administered 2013-07-18: 21:00:00 via INTRAVENOUS

## 2013-07-18 MED ORDER — DEXTROSE 5 % IV SOLN: 1.0000 g | INTRAVENOUS | Status: DC

## 2013-07-18 MED ORDER — ONDANSETRON HCL 4 MG/2ML IJ SOLN: 4.0000 mg | Freq: Four times a day (QID) | INTRAMUSCULAR | Status: DC | PRN

## 2013-07-18 MED ORDER — SODIUM CHLORIDE 0.9 % IJ SOLN
3.0000 mL | Freq: Two times a day (BID) | INTRAMUSCULAR | Status: DC
  Administered 2013-07-18 – 2013-07-30 (×11): 3 mL via INTRAVENOUS

## 2013-07-18 MED ORDER — ALBUTEROL SULFATE HFA 108 (90 BASE) MCG/ACT IN AERS
2.0000 | INHALATION_SPRAY | Freq: Four times a day (QID) | RESPIRATORY_TRACT | Status: DC | PRN
  Filled 2013-07-18: qty 6.7

## 2013-07-18 MED ORDER — SODIUM BICARBONATE 8.4 % IV SOLN
50.0000 meq | Freq: Once | INTRAVENOUS | Status: AC
  Administered 2013-07-18: 50 meq via INTRAVENOUS
  Filled 2013-07-18: qty 50

## 2013-07-18 MED ORDER — VENLAFAXINE HCL 37.5 MG PO TABS
37.5000 mg | ORAL_TABLET | Freq: Two times a day (BID) | ORAL | Status: DC
  Filled 2013-07-18: qty 1

## 2013-07-18 MED ORDER — LACTULOSE 10 GM/15ML PO SOLN
30.0000 g | Freq: Once | ORAL | Status: AC
  Administered 2013-07-19: 30 g via ORAL
  Filled 2013-07-18: qty 45

## 2013-07-18 MED ORDER — LACTULOSE 10 GM/15ML PO SOLN
20.0000 g | Freq: Two times a day (BID) | ORAL | Status: DC
  Administered 2013-07-19: 20 g via ORAL
  Filled 2013-07-18 (×5): qty 30

## 2013-07-18 MED ORDER — SODIUM CHLORIDE 0.9 % IV SOLN
1.0000 g | Freq: Once | INTRAVENOUS | Status: AC
  Administered 2013-07-18: 1 g via INTRAVENOUS
  Filled 2013-07-18 (×2): qty 10

## 2013-07-18 MED ORDER — CALCIUM CARBONATE 1250 (500 CA) MG PO TABS
1.0000 | ORAL_TABLET | Freq: Every day | ORAL | Status: DC
  Administered 2013-07-19 – 2013-07-29 (×9): 500 mg via ORAL
  Filled 2013-07-18 (×13): qty 1

## 2013-07-18 MED ORDER — SODIUM POLYSTYRENE SULFONATE 15 GM/60ML PO SUSP
15.0000 g | Freq: Once | ORAL | Status: AC
  Administered 2013-07-18: 15 g via RECTAL
  Filled 2013-07-18: qty 60

## 2013-07-18 MED ORDER — ONDANSETRON HCL 4 MG PO TABS: 4.0000 mg | ORAL_TABLET | Freq: Four times a day (QID) | ORAL | Status: DC | PRN

## 2013-07-18 MED ORDER — FLUTICASONE PROPIONATE 50 MCG/ACT NA SUSP
2.0000 | Freq: Every day | NASAL | Status: DC
  Administered 2013-07-19 – 2013-07-30 (×11): 2 via NASAL
  Filled 2013-07-18 (×2): qty 16

## 2013-07-18 MED ORDER — SODIUM POLYSTYRENE SULFONATE 15 GM/60ML PO SUSP
15.0000 g | Freq: Once | ORAL | Status: DC
  Filled 2013-07-18: qty 60

## 2013-07-18 MED ORDER — DEXTROSE 50 % IV SOLN
25.0000 mL | Freq: Once | INTRAVENOUS | Status: AC | PRN
  Administered 2013-07-18: 25 mL via INTRAVENOUS

## 2013-07-18 MED ORDER — DEXTROSE-NACL 5-0.45 % IV SOLN
INTRAVENOUS | Status: DC
  Administered 2013-07-18 – 2013-07-19 (×2): via INTRAVENOUS

## 2013-07-18 MED ORDER — DEXTROSE 50 % IV SOLN
INTRAVENOUS | Status: AC
  Administered 2013-07-18: 50 mL
  Filled 2013-07-18: qty 50

## 2013-07-18 MED ORDER — LORATADINE 10 MG PO TABS
10.0000 mg | ORAL_TABLET | Freq: Every day | ORAL | Status: DC
  Filled 2013-07-18: qty 1

## 2013-07-18 NOTE — ED Notes (Signed)
Family at bedside. 

## 2013-07-18 NOTE — ED Provider Notes (Signed)
CSN: [id number]     Arrival date & time 07/18/13  1125 History   First MD Initiated Contact with Patient 07/18/13 1116     Chief Complaint  Patient presents with  . Altered Mental Status   (Consider location/radiation/quality/duration/timing/severity/associated sxs/prior Treatment) HPI  Colleen Olson is a 52 y.o. female presenting with AMS and acute changes in functional status over the past 3-4 days. Complicated PMHx. Has been complaining of LLE pain for past few days. No reported trauma and has be essentially bed bound for past couple weeks.  Seen in ED two days ago and discharged after unremarkable imaging. Pt has continued to decline since and becoming more confused. Poor PO intake. History primarily obtained from old records and family at bedside. Difficult to get history from patient as she answers "yes" to pretty much every question on ROS.    Past Medical History  Diagnosis Date  . Asthma   . Hypertension   . Angina   . Osteoarthritis of both knees   . Exertional dyspnea 12/01/11  . Borderline diabetes   . Headache(784.0) 12/02/11    "sometimes when I wake up in am; real bad"  . Arthritis   . Chronic lower back pain   . Tachycardia    Past Surgical History  Procedure Laterality Date  . Joint replacement    . Total knee arthroplasty  03/02/2011    left  . Tonsillectomy      "I was a little girl"  . Tubal ligation  1982   Family History  Problem Relation Age of Onset  . Diabetes Mother   . Diabetes Father   . Diabetes Sister   . Diabetes Brother    History  Substance Use Topics  . Smoking status: Never Smoker   . Smokeless tobacco: Never Used     Comment: 12/02/11 "around second hand smoke"  . Alcohol Use: Yes     Comment: 3-5 40oz. beers per day   OB History   Grav Para Term Preterm Abortions TAB SAB Ect Mult Living                 Review of Systems   Level 5 caveat because pt is confused and unreliable.   Allergies  Review of patient's allergies  indicates no known allergies.  Home Medications   Current Outpatient Rx  Name  Route  Sig  Dispense  Refill  . albuterol (PROAIR HFA) 108 (90 BASE) MCG/ACT inhaler   Inhalation   Inhale 2 puffs into the lungs every 6 (six) hours as needed for wheezing or shortness of breath.   6.7 g   11   . calcium carbonate (OS-CAL - DOSED IN MG OF ELEMENTAL CALCIUM) 1250 MG tablet   Oral   Take 1 tablet by mouth daily.         . cetirizine (ZYRTEC) 10 MG tablet   Oral   Take 10 mg by mouth daily.         . cyclobenzaprine (FLEXERIL) 10 MG tablet   Oral   Take 1 tablet (10 mg total) by mouth 2 (two) times daily as needed for muscle spasms.   20 tablet   0   . fluticasone (FLONASE) 50 MCG/ACT nasal spray   Nasal   Place 2 sprays into the nose daily.         . furosemide (LASIX) 20 MG tablet   Oral   Take 0.5 tablets (10 mg total) by mouth daily.   30 tablet  5   . lactulose (CHRONULAC) 10 GM/15ML solution   Oral   Take 30 mLs (20 g total) by mouth 2 (two) times daily.   240 mL   0   . Nutritional Supplements (NUTRITIONAL SHAKE) LIQD   Oral   Take 1 each by mouth 3 (three) times daily.   90 Bottle   5   . Oxycodone HCl 10 MG TABS   Oral   Take 1 tablet (10 mg total) by mouth 3 (three) times daily as needed.   90 tablet   0   . oxyCODONE-acetaminophen (PERCOCET/ROXICET) 5-325 MG per tablet   Oral   Take 2 tablets by mouth every 4 (four) hours as needed for severe pain.   16 tablet   0   . pentoxifylline (TRENTAL) 400 MG CR tablet   Oral   Take 400 mg by mouth 3 (three) times daily with meals.         . polyethylene glycol (MIRALAX / GLYCOLAX) packet   Oral   Take 17 g by mouth daily.   14 each   0   . potassium chloride SA (K-DUR,KLOR-CON) 20 MEQ tablet   Oral   Take 1 tablet (20 mEq total) by mouth daily.   30 tablet   5   . propranolol (INDERAL) 20 MG tablet   Oral   Take 1 tablet (20 mg total) by mouth 3 (three) times daily.   30 tablet   5    . senna (SENOKOT) 8.6 MG TABS tablet   Oral   Take 1 tablet (8.6 mg total) by mouth 2 (two) times daily.   120 each   0   . spironolactone (ALDACTONE) 100 MG tablet   Oral   Take 1 tablet (100 mg total) by mouth daily.   30 tablet   5   . venlafaxine (EFFEXOR) 37.5 MG tablet   Oral   Take 1 tablet (37.5 mg total) by mouth 2 (two) times daily.   30 tablet   5    BP 97/36  Pulse 92  Temp(Src) 97.9 F (36.6 C) (Oral)  Resp 22  SpO2 97% Physical Exam  Nursing note and vitals reviewed. Constitutional:  Laying in bed. Obese. Occasionally moaning and complaining of LLE pain.   HENT:  Head: Normocephalic and atraumatic.  Eyes: Conjunctivae are normal. Pupils are equal, round, and reactive to light. Right eye exhibits no discharge. Left eye exhibits no discharge.  Neck: Neck supple.  Cardiovascular: Normal rate, regular rhythm and normal heart sounds.  Exam reveals no gallop and no friction rub.   No murmur heard. Pulmonary/Chest: Effort normal.  Decreased breath sounds b/l  Abdominal: Soft. She exhibits no distension. There is no tenderness.  Obese. Soft. NT. Doesn't seem distended.   Musculoskeletal: She exhibits no edema and no tenderness.  Scar from knee replacement. Well healed. Holds LLE with kne slightly flexed and hip internally rotated. Wont move on command. Will resist though with me ranging knee and hip and noted to move herself.   Neurological: She is alert.  Disoriented to time. Hold head rotated to to L but will move with encouragement and doesn't seem to have neglect. Has difficulty with more than 1 step commands. No facial droop. Strength 5/5 b/l UE. Will not move LLE on command but noted to move spontaneously during other parts of exam.   Skin: Skin is warm and dry.  Psychiatric: She has a normal mood and affect. Her behavior is normal. Thought content  normal.    ED Course  Procedures (including critical care time)  CRITICAL CARE Performed by: Raeford Razor Total critical care time: 30 minutes Critical care time was exclusive of separately billable procedures and treating other patients. Critical care was necessary to treat or prevent imminent or life-threatening deterioration. Critical care was time spent personally by me on the following activities: development of treatment plan with patient and/or surrogate as well as nursing, discussions with consultants, evaluation of patient's response to treatment, examination of patient, obtaining history from patient or surrogate, ordering and performing treatments and interventions, ordering and review of laboratory studies, ordering and review of radiographic studies, pulse oximetry and re-evaluation of patient's condition.  Labs Review Labs Reviewed  GLUCOSE, CAPILLARY - Abnormal; Notable for the following:    Glucose-Capillary 62 (*)    All other components within normal limits  CBC - Abnormal; Notable for the following:    WBC 13.7 (*)    RBC 2.96 (*)    Hemoglobin 8.1 (*)    HCT 22.8 (*)    MCV 77.0 (*)    RDW 17.5 (*)    Platelets 121 (*)    All other components within normal limits  COMPREHENSIVE METABOLIC PANEL - Abnormal; Notable for the following:    Sodium 131 (*)    Potassium 6.8 (*)    Glucose, Bld 107 (*)    BUN 31 (*)    Creatinine, Ser 4.98 (*)    Calcium 8.3 (*)    Albumin 1.6 (*)    AST 45 (*)    Alkaline Phosphatase 157 (*)    Total Bilirubin 3.6 (*)    GFR calc non Af Amer 9 (*)    GFR calc Af Amer 10 (*)    All other components within normal limits  PROTIME-INR - Abnormal; Notable for the following:    Prothrombin Time 22.0 (*)    INR 1.99 (*)    All other components within normal limits  AMMONIA  GLUCOSE, CAPILLARY  URINALYSIS, ROUTINE W REFLEX MICROSCOPIC  BASIC METABOLIC PANEL   Imaging Review Dg Hip Complete Left  07/18/2013   CLINICAL DATA:  Left hip pain.  EXAM: LEFT HIP - COMPLETE 2+ VIEW  COMPARISON:  None.  FINDINGS: There is no visible  fracture. No dislocation. Pelvic bones are intact as well. The patient was unable to externally rotate the hip.  IMPRESSION: Normal exam.   Electronically Signed   By: Geanie Cooley M.D.   On: 07/18/2013 13:51    EKG Interpretation    Date/Time:  Thursday July 18 2013 12:38:57 EST Ventricular Rate:  94 PR Interval:  173 QRS Duration: 137 QT Interval:  393 QTC Calculation: 491 R Axis:   50 Text Interpretation:  Sinus rhythm IVCD, consider atypical LBBB ED PHYSICIAN INTERPRETATION AVAILABLE IN [facility] Confirmed by TEST, RECORD ([id number]) on 07/20/2013 8:50:44 AM            MDM   1. ARF (acute renal failure)   2. Hyperkalemia   3. Encephalopathy   4. UTI  52yF with encephalopathy. Confused but complaining of L knee pain of unclear etiology. Imaging from recent ED visit reviewed. No report of trauma, but pt unreliable historian. Imaging of hip done for possible referred pain from hip. Unremarkable. Hx of cirrhosis but abdomen seems NT. W/o significant for AKI, hyperkalemia and UTI. No EKG changes. Meds for hyperK given. VF for possible component of pre-renal and also hypotension. Anemic, but baseline hemoglobin appears to be around  9.     Raeford Razor, MD 07/26/13 2147

## 2013-07-18 NOTE — ED Notes (Signed)
Patient transported to CT 

## 2013-07-18 NOTE — ED Notes (Signed)
[name]: Daughter [phone]

## 2013-07-18 NOTE — Progress Notes (Signed)
Hypoglycemic Event  CBG: 69  Treatment: D50 IV 25 mL  Symptoms: Hungry  Follow-up CBG: Time:20:49 CBG Result:106 Possible Reasons for Event: Inadequate meal intake  Comments/MD notified:per hypoglycemia protocol    Colleen Olson  Remember to initiate Hypoglycemia Order Set & complete

## 2013-07-18 NOTE — ED Notes (Addendum)
Pt presenting with AMS, CBG 62, from home, daughter on the way, patient dc from here 3 days ago for pain in her knee, pt had a TKR on the left knee at [facility].  Since patient dc from here 3 days ago she has been on couch and has not gotten up since, and ems reports that her daughter states that "she just hasnt been herself since she was dc". Hx of cirrhosis, and sclera are yellow.  Patient not answer questions appropriately and not following commands.  EMS GCS 13

## 2013-07-18 NOTE — ED Notes (Signed)
EDP at bedside  

## 2013-07-18 NOTE — H&P (Cosign Needed)
Family Medicine Teaching [facility] Admission History and Physical Service Pager: [phone]  Patient name: Colleen Olson Medical record number: [id number] Date of birth: [birth year] Age: 52 y.o. Gender: female  Primary Care Provider: Rodman Pickle, MD Consultants: None Code Status: Full (reclarified)  Chief Complaint: confusion, decreased PO intake  Assessment and Plan: Colleen Olson is a 52 y.o. female presenting with AMS and acute changes in functional status over the past 4 dasy. 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 Afebrile with WBC of 13.7. No clear source of infection at this time, U/A with nitrites but many squams and few bacteria. Elevated BUN to 31 when pt with baseline 10-12. Daughter denies any recent changes to her medications or alcohol/drug use. Full neuro w/up not completed in the ED. No evidence of afib on EKG.  Does not appear to have neuro deficits but difficult for pt to cooperate with exam. Neg hip, knee XR. But pt with prolonged immobility 2/2 pain and discomfort. Lungs without evidence of consolidation or PE and no evidence of lower extremity swelling or warmth. Does not appear to have acute abdomen although there is a palpable fluid wave present. Will obtain cardiac enzymes to r/o MI -CXR, home O2, VS monitoring -BCX, UCx pending -CT head for intracranial pathology -Cycle troponins -avoid toxic agents -NPO until SLP bedside swallow -strict bedrest -trend CMETs  #Cirrhosis (2/2 Hep C and ETOH)- saw Hepatologist about 3 weeks ago. No recent ingestions per daughter. Ammonia 52 in ED. Scleral icterus present on exam. Pt on lasix and spirnolactone at home (per report took all her medications this morning). No evidence of bruising or purpura on exam plt 121 today. INR 1.99, PT 22.0 -tox  screen -cont home medications spiro 100 and lasix 40 -trend with CMETs -consider touching base with hepatology -lactulose BID, titrate to BMs, strict i/os and daily weights -FOBT  #AKI: Cr 4.98 in ED (pt given X1 of lasix prior to obtaining BMET), Cr noted to be 1.38 on 12/1, possibility of hepatorenal syndrome given worsening liver fxn -rehydrate with NS @125ml /hr -trend Cr with labs, consider renal consult  #Hyperkalemia- 6.8, no EKG changes, s/p calcium gluconate and sodium bicarb with improvement to 5.8 -will order kayexlate  -trend with BMETs  #Asthma/on O2- On 2LNC at home. Daughter had noticed pt with increased WOB lately and some gasping sensation. No positional relationship. Difficult to examine lungs 2/2 pt screaming  -cont albuterol prn -home o2 requirement -pulse ox continuous -monitor vs -loratidine, flonase prn  #Right knee pain- s/p TKR. No acute exacerbating factors. Pt daughter report has not ambulated since surgery.  -hold pain medications in light of altered MS  FEN/GI: NPO until mental status improves, IVF Prophylaxis: SCDs, hold hsq in light of elevated INR  Disposition: admit to tele under Dr. Mauricio Po  History of Present Illness: Colleen Olson is a 52 y.o. female presenting with AMS per daughter report for approx 24hrs. Pt has been increasingly confused in the last several months but with a waxing/waning presentation. Current state is an acute change from previous including pt talking to people that aren't there and attempting to wrap O2 cord around her head. Follows with [facility] hepatology(?), who have recently initiated discussions regarding palliative and hospital. In the last several days Daughter also noted pt with several acute changes in general health  including ncreased bleeding from nose and mouth approx 2 days ago, stasis achieved after about an hr. Poor PO intake with No BM in approx 4 days. Difficulty breathing on home dose O2 (on 2L O2 at home),  associated with some gasping. Used to be able to walk, however has not been able to bear weight, since acute episode of knee pain several weeks ago. Since she left the hospital at that time has not been able to walk.   Daughter denies recent ingestions or acute changes in medications. No recent sick contacts  In the ED, K 6.8 (no evidence of EKG changes) s/p albuterol, calcium gluconate, sodium bicarb, repeat 5.8. Afebrile but with WBC of 13.7. S/p 1 L bolus. Cr noted to be 4.98 with BUN 31 (baseline Cr1.3 with BUN 10-12s). Alk phos 157, tbili 3.6 (appears close to baseline). U/A with large bili and ketones and post nitrites but   Review Of Systems: Per HPI with the following additions: None Otherwise 12 point review of systems was performed and was unremarkable.  Patient Active Problem List   Diagnosis Date Noted  . Nursing home resident 05/30/2013  . Abdominal pain, chronic, right upper quadrant 05/21/2013  . Bright red blood per rectum 05/21/2013  . Anemia of chronic disease 05/21/2013  . Thrombocytopenia, unspecified 05/21/2013  . Cirrhosis of liver 05/21/2013  . Frequent falls 05/06/2013  . Depression 05/06/2013  . Hepatitis C, chronic 04/17/2013  . Dyspnea 04/08/2013  . Hypokalemia 04/08/2013  . Elevated liver enzymes 04/07/2013  . Elevated alkaline phosphatase level 04/07/2013  . Hyponatremia 04/07/2013  . Alcohol dependence 04/07/2013  . Elevated bilirubin 04/07/2013  . Edema of both legs 04/07/2013  . Sinusitis, chronic 02/28/2013  . Tachycardia 12/03/2011  . Hot flashes, menopausal 10/14/2010  . Morbid obesity 07/22/2010  . ALLERGIC RHINITIS 07/22/2010  . DEGENERATIVE JOINT DISEASE, BOTH KNEES, SEVERE 03/05/2010  . Essential hypertension, benign 08/31/2009  . OSTEOPENIA 08/24/2009  . ASTHMA, INTERMITTENT 08/17/2009  . BACK PAIN, CHRONIC 08/17/2009   Past Medical History: Past Medical History  Diagnosis Date  . Asthma   . Hypertension   . Angina   .  Osteoarthritis of both knees   . Exertional dyspnea 12/01/11  . Borderline diabetes   . Headache(784.0) 12/02/11    "sometimes when I wake up in am; real bad"  . Arthritis   . Chronic lower back pain   . Tachycardia    Past Surgical History: Past Surgical History  Procedure Laterality Date  . Joint replacement    . Total knee arthroplasty  03/02/2011    left  . Tonsillectomy      "I was a little girl"  . Tubal ligation  1982   Social History: History  Substance Use Topics  . Smoking status: Never Smoker   . Smokeless tobacco: Never Used     Comment: 12/02/11 "around second hand smoke"  . Alcohol Use: Yes     Comment: 3-5 40oz. beers per day   Additional social history: at home with daughter Please also refer to relevant sections of EMR.  Family History: Family History  Problem Relation Age of Onset  . Diabetes Mother   . Diabetes Father   . Diabetes Sister   . Diabetes Brother    Allergies and Medications: No Known Allergies No current facility-administered medications on file prior to encounter.   Current Outpatient Prescriptions on File Prior to Encounter  Medication Sig Dispense Refill  . albuterol (PROAIR HFA) 108 (90 BASE) MCG/ACT inhaler Inhale  2 puffs into the lungs every 6 (six) hours as needed for wheezing or shortness of breath.  6.7 g  11  . calcium carbonate (OS-CAL - DOSED IN MG OF ELEMENTAL CALCIUM) 1250 MG tablet Take 1 tablet by mouth daily.      . cetirizine (ZYRTEC) 10 MG tablet Take 10 mg by mouth daily.      . cyclobenzaprine (FLEXERIL) 10 MG tablet Take 1 tablet (10 mg total) by mouth 2 (two) times daily as needed for muscle spasms.  20 tablet  0  . fluticasone (FLONASE) 50 MCG/ACT nasal spray Place 2 sprays into the nose daily.      . furosemide (LASIX) 20 MG tablet Take 0.5 tablets (10 mg total) by mouth daily.  30 tablet  5  . lactulose (CHRONULAC) 10 GM/15ML solution Take 30 mLs (20 g total) by mouth 2 (two) times daily.  240 mL  0  . Nutritional  Supplements (NUTRITIONAL SHAKE) LIQD Take 1 each by mouth 3 (three) times daily.  90 Bottle  5  . Oxycodone HCl 10 MG TABS Take 1 tablet (10 mg total) by mouth 3 (three) times daily as needed.  90 tablet  0  . oxyCODONE-acetaminophen (PERCOCET/ROXICET) 5-325 MG per tablet Take 2 tablets by mouth every 4 (four) hours as needed for severe pain.  16 tablet  0  . pentoxifylline (TRENTAL) 400 MG CR tablet Take 400 mg by mouth 3 (three) times daily with meals.      . polyethylene glycol (MIRALAX / GLYCOLAX) packet Take 17 g by mouth daily.  14 each  0  . potassium chloride SA (K-DUR,KLOR-CON) 20 MEQ tablet Take 1 tablet (20 mEq total) by mouth daily.  30 tablet  5  . propranolol (INDERAL) 20 MG tablet Take 1 tablet (20 mg total) by mouth 3 (three) times daily.  30 tablet  5  . senna (SENOKOT) 8.6 MG TABS tablet Take 1 tablet (8.6 mg total) by mouth 2 (two) times daily.  120 each  0  . spironolactone (ALDACTONE) 100 MG tablet Take 1 tablet (100 mg total) by mouth daily.  30 tablet  5  . venlafaxine (EFFEXOR) 37.5 MG tablet Take 1 tablet (37.5 mg total) by mouth 2 (two) times daily.  30 tablet  5    Objective: BP 97/36  Pulse 92  Temp(Src) 97.9 F (36.6 C) (Oral)  Resp 22  SpO2 97% Exam: General: NAD, lying on back, confused, difficult making eye contact, trouble following commands HEENT: NCAT, dry MMM, PERRL, does not want to track to the right (2/2 neck pain, and cannot understand to only move her eyes) Cardiovascular: RRR, no m/r/g Respiratory: CTAB (when pt not screaming) no evidence of increased WOB Abdomen: obese, palpable fluid wave, no peritoneal signs, no rebound/guarding, soft, nd Extremities: WWP, bilat edema up to mid shin, with evidence of TKR on left (pt not wanting to move leg) Skin: no evidence of bruising or bleeding Neuro: alert and oriented to self and place, unsure of the date and the time, unable to assess CN 2/2 poor pt cooperation, ?+asterxis, bilat hand grip 5/5, unable to  understand testing commands for UE strength    Labs and Imaging: CBC BMET   Recent Labs Lab 07/18/13 1201  WBC 13.7*  HGB 8.1*  HCT 22.8*  PLT 121*    Recent Labs Lab 07/18/13 1201  NA 131*  K 6.8*  CL 102  CO2 21  BUN 31*  CREATININE 4.98*  GLUCOSE 107*  CALCIUM  8.3*     DG hip neg  Anselm Lis, MD 07/18/2013, 2:32 PM PGY-1, [facility] Family Medicine FPTS Intern pager: [phone], text pages welcome

## 2013-07-18 NOTE — ED Notes (Signed)
Attending at bedside.

## 2013-07-18 NOTE — H&P (Signed)
FMTS Attending Admit NOte Patient seen and examined by me, discussed with resident team and I agree with Dr Purvis Sheffield assessment and plan.  Patient is awake, answers some of my questions somewhat coherently before becoming tangential.  Oriented to "[facility]" and "December", year is "Two-thousand and... December".  Pan-positive ROS, appears to lack insight into questions.  A/P: Altered mental status; unclear what her baseline is.  Agree with toxicology workup; does not have high ammonia level, no report of fever.  To check CXR for consideration of possible PNA; noncontrast head CT.  Monitor on telemetry.  Hydrate overnight, watch for improvement in renal function.  Paula Compton, MD

## 2013-07-18 NOTE — ED Notes (Signed)
Blood cultures being drawn now

## 2013-07-18 NOTE — H&P (Signed)
Family Medicine Teaching [facility] Admission History and Physical Service Pager: [phone]  Patient name: Colleen Olson Medical record number: [id number] Date of birth: February 01, 1961 Age: 52 y.o. Gender: female  Primary Care Provider: Rodman Pickle, MD Consultants: None Code Status: Full (reclarified)  Chief Complaint: confusion, decreased PO intake  Assessment and Plan: Colleen Olson is a 52 y.o. female presenting with AMS and acute changes in functional status over the past 4 dasy. 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 Afebrile with WBC of 13.7. No clear source of infection at this time, U/A with nitrites but many squams and few bacteria making clean catch urine unlikely. Elevated BUN to 31 when pt with baseline 10-12. Daughter denies any recent changes to her medications or alcohol/drug use. Full neuro w/up not completed in the ED. No evidence of afib on EKG.  Does not appear to have neuro deficits but difficult for pt to cooperate with exam. Neg hip, knee XR. But pt with prolonged immobility 2/2 pain and discomfort. Lungs without evidence of consolidation or PE and no evidence of lower extremity swelling or warmth. Does not appear to have acute abdomen although there is a palpable fluid wave present. Patient without BM in several days and unknown if taking lactulose, potentially AMS could be related to a hepatic encephalopathy though ammonia is normal. Must also consider ingestion as patient is on narcotics at home. Note patient also on venlafaxine per her med rec and with hepatic impairment this puts her at increased risk for serotonin syndrome, so this must be considered as well. -admit to tele, attending Dr Mauricio Po -CXR, home O2, VS monitoring -BCX, UCx pending -CT head for intracranial pathology -Cycle  troponins -avoid toxic agents -NPO until SLP bedside swallow -strict bedrest -trend CMETs -check ETOH, tylenol, and salicylate levels -hold home flexeril and narcotics and venlafaxine  #Cirrhosis (2/2 Hep C and ETOH)- saw Hepatologist about 3 weeks ago. No recent ingestions per daughter. Ammonia 52 in ED. Scleral icterus present on exam. Pt on lasix and spirnolactone at home (per report took all her medications this morning). No evidence of bruising or purpura on exam plt 121 today. INR 1.99, PT 22.0 -tox screen -will hold home spironolactone and lasix given renal failure -trend with CMETs -consider touching base with hepatology -lactulose BID, titrate to BMs, strict i/os and daily weights -FOBT  #AKI: Cr 4.98 in ED (pt given X1 of lasix prior to obtaining BMET), Cr noted to be 1.38 on 12/1, possibility of hepatorenal syndrome given worsening liver fxn -rehydrate with NS @125ml /hr -trend Cr with labs, consider renal consult -hold nephrotoxic agents  #Hyperkalemia- 6.8, no EKG changes, s/p calcium gluconate and sodium bicarb with improvement to 5.8 -will order kayexlate  -trend with BMETs  #Asthma on O2- On 2LNC at home. Daughter had noticed pt with increased WOB lately and some gasping sensation. No positional relationship. Difficult to examine lungs 2/2 pt screaming  -cont albuterol prn -home o2 requirement -pulse ox continuous -monitor vs -loratidine, flonase prn  #Right knee pain- s/p TKR. No acute exacerbating factors. Pt daughter report has not ambulated since surgery.  -hold pain medications in light of altered MS -consider imaging of femur and distal LE if pain continues  FEN/GI: NPO until mental status improves, IVF Prophylaxis: SCDs, hold hsq in light of elevated INR  Disposition:  admit to tele under Dr. Mauricio Po  History of Present Illness: Colleen Olson is a 52 y.o. female presenting with AMS per daughter report for approx 24hrs. Pt has been increasingly confused  in the last several months but with a waxing/waning presentation. Current state is an acute change from previous including pt talking to people that aren't there and attempting to wrap O2 cord around her head. Follows with [facility] hepatology(?), who have recently initiated discussions regarding palliative and hospice. In the last several days Daughter also noted pt with several acute changes in general health including increased bleeding from nose and mouth approx 2 days ago, stasis achieved after about an hr. Poor PO intake with No BM in approx 4 days. Difficulty breathing on home dose O2 (on 2L O2 at home), associated with some gasping. Used to be able to walk, however has not been able to bear weight since acute episode of knee pain several weeks ago. Since she left the hospital at that time has not been able to walk.   Daughter denies recent ingestions or acute changes in medications. No recent sick contacts  In the ED, K 6.8 (no evidence of EKG changes) s/p albuterol, calcium gluconate, sodium bicarb, repeat 5.8. Afebrile but with WBC of 13.7. S/p 1 L bolus. Cr noted to be 4.98 with BUN 31 (baseline Cr1.3 with BUN 10-12s). Alk phos 157, tbili 3.6 (appears close to baseline). U/A with large bili and ketones and positive nitrites but with many squamous epithelial cells and rare bacteria/3-6 WBC.   Review Of Systems: Per HPI with the following additions: None Otherwise 12 point review of systems was performed and was unremarkable.  Patient Active Problem List   Diagnosis Date Noted  . Nursing home resident 05/30/2013  . Abdominal pain, chronic, right upper quadrant 05/21/2013  . Bright red blood per rectum 05/21/2013  . Anemia of chronic disease 05/21/2013  . Thrombocytopenia, unspecified 05/21/2013  . Cirrhosis of liver 05/21/2013  . Frequent falls 05/06/2013  . Depression 05/06/2013  . Hepatitis C, chronic 04/17/2013  . Dyspnea 04/08/2013  . Hypokalemia 04/08/2013  . Elevated liver enzymes  04/07/2013  . Elevated alkaline phosphatase level 04/07/2013  . Hyponatremia 04/07/2013  . Alcohol dependence 04/07/2013  . Elevated bilirubin 04/07/2013  . Edema of both legs 04/07/2013  . Sinusitis, chronic 02/28/2013  . Tachycardia 12/03/2011  . Hot flashes, menopausal 10/14/2010  . Morbid obesity 07/22/2010  . ALLERGIC RHINITIS 07/22/2010  . DEGENERATIVE JOINT DISEASE, BOTH KNEES, SEVERE 03/05/2010  . Essential hypertension, benign 08/31/2009  . OSTEOPENIA 08/24/2009  . ASTHMA, INTERMITTENT 08/17/2009  . BACK PAIN, CHRONIC 08/17/2009   Past Medical History: Past Medical History  Diagnosis Date  . Asthma   . Hypertension   . Angina   . Osteoarthritis of both knees   . Exertional dyspnea 12/01/11  . Borderline diabetes   . Headache(784.0) 12/02/11    "sometimes when I wake up in am; real bad"  . Arthritis   . Chronic lower back pain   . Tachycardia    Past Surgical History: Past Surgical History  Procedure Laterality Date  . Joint replacement    . Total knee arthroplasty  03/02/2011    left  . Tonsillectomy      "I was a little girl"  . Tubal ligation  1982   Social History: History  Substance Use Topics  . Smoking status: Never Smoker   . Smokeless tobacco: Never Used     Comment: 12/02/11 "around second hand smoke"  .  Alcohol Use: Yes     Comment: 3-5 40oz. beers per day   Additional social history: at home with daughter Please also refer to relevant sections of EMR.  Family History: Family History  Problem Relation Age of Onset  . Diabetes Mother   . Diabetes Father   . Diabetes Sister   . Diabetes Brother    Allergies and Medications: No Known Allergies No current facility-administered medications on file prior to encounter.   Current Outpatient Prescriptions on File Prior to Encounter  Medication Sig Dispense Refill  . albuterol (PROAIR HFA) 108 (90 BASE) MCG/ACT inhaler Inhale 2 puffs into the lungs every 6 (six) hours as needed for wheezing or  shortness of breath.  6.7 g  11  . calcium carbonate (OS-CAL - DOSED IN MG OF ELEMENTAL CALCIUM) 1250 MG tablet Take 1 tablet by mouth daily.      . cetirizine (ZYRTEC) 10 MG tablet Take 10 mg by mouth daily.      . cyclobenzaprine (FLEXERIL) 10 MG tablet Take 1 tablet (10 mg total) by mouth 2 (two) times daily as needed for muscle spasms.  20 tablet  0  . fluticasone (FLONASE) 50 MCG/ACT nasal spray Place 2 sprays into the nose daily.      . furosemide (LASIX) 20 MG tablet Take 0.5 tablets (10 mg total) by mouth daily.  30 tablet  5  . lactulose (CHRONULAC) 10 GM/15ML solution Take 30 mLs (20 g total) by mouth 2 (two) times daily.  240 mL  0  . Nutritional Supplements (NUTRITIONAL SHAKE) LIQD Take 1 each by mouth 3 (three) times daily.  90 Bottle  5  . Oxycodone HCl 10 MG TABS Take 1 tablet (10 mg total) by mouth 3 (three) times daily as needed.  90 tablet  0  . oxyCODONE-acetaminophen (PERCOCET/ROXICET) 5-325 MG per tablet Take 2 tablets by mouth every 4 (four) hours as needed for severe pain.  16 tablet  0  . pentoxifylline (TRENTAL) 400 MG CR tablet Take 400 mg by mouth 3 (three) times daily with meals.      . polyethylene glycol (MIRALAX / GLYCOLAX) packet Take 17 g by mouth daily.  14 each  0  . potassium chloride SA (K-DUR,KLOR-CON) 20 MEQ tablet Take 1 tablet (20 mEq total) by mouth daily.  30 tablet  5  . propranolol (INDERAL) 20 MG tablet Take 1 tablet (20 mg total) by mouth 3 (three) times daily.  30 tablet  5  . senna (SENOKOT) 8.6 MG TABS tablet Take 1 tablet (8.6 mg total) by mouth 2 (two) times daily.  120 each  0  . spironolactone (ALDACTONE) 100 MG tablet Take 1 tablet (100 mg total) by mouth daily.  30 tablet  5  . venlafaxine (EFFEXOR) 37.5 MG tablet Take 1 tablet (37.5 mg total) by mouth 2 (two) times daily.  30 tablet  5    Objective: BP 97/36  Pulse 92  Temp(Src) 97.9 F (36.6 C) (Oral)  Resp 22  SpO2 97% Exam: General: NAD, lying on back, confused, difficult making  eye contact, trouble following commands HEENT: NCAT, dry MMM, PERRL, does not want to track to the right (2/2 neck pain, and cannot understand to only move her eyes) Cardiovascular: RRR, no m/r/g Respiratory: CTAB (when pt not screaming) no evidence of increased WOB Abdomen: obese, palpable fluid wave, no peritoneal signs, no rebound/guarding, soft, nd Extremities: WWP, bilat edema up to mid shin, with evidence of TKR on left (pt not wanting  to move leg) Skin: no evidence of bruising or bleeding Neuro: alert and oriented to self and place, unsure of the date and the time, unable to assess CN 2/2 poor pt cooperation, ?+asterxis, bilat hand grip 5/5, unable to understand testing commands for UE strength and sensation to light touch  Labs and Imaging: CBC BMET   Recent Labs Lab 07/18/13 1201  WBC 13.7*  HGB 8.1*  HCT 22.8*  PLT 121*    Recent Labs Lab 07/18/13 1201  NA 131*  K 6.8*  CL 102  CO2 21  BUN 31*  CREATININE 4.98*  GLUCOSE 107*  CALCIUM 8.3*     DG hip neg Ammonia 52 INR 1.99 PT 22  Anselm Lis, MD 07/18/2013, 2:32 PM PGY-1, [facility] FPTS Intern pager: [phone], text pages welcome  Upper Level Addendum:  I have seen and evaluated this patient along with Dr. Michail Jewels and reviewed the above note, making necessary revisions in red.   Marikay Alar, MD Family Medicine PGY-2

## 2013-07-19 ENCOUNTER — Inpatient Hospital Stay (HOSPITAL_COMMUNITY): Payer: Medicare Other

## 2013-07-19 DIAGNOSIS — M79609 Pain in unspecified limb: Secondary | ICD-10-CM

## 2013-07-19 LAB — COMPREHENSIVE METABOLIC PANEL
ALT: 13 U/L (ref 0–35)
AST: 43 U/L — ABNORMAL HIGH (ref 0–37)
Albumin: 1.3 g/dL — ABNORMAL LOW (ref 3.5–5.2)
Alkaline Phosphatase: 153 U/L — ABNORMAL HIGH (ref 39–117)
BUN: 36 mg/dL — ABNORMAL HIGH (ref 6–23)
CO2: 20 mEq/L (ref 19–32)
Calcium: 7.9 mg/dL — ABNORMAL LOW (ref 8.4–10.5)
Chloride: 104 mEq/L (ref 96–112)
Creatinine, Ser: 5.13 mg/dL — ABNORMAL HIGH (ref 0.50–1.10)
GFR calc Af Amer: 10 mL/min — ABNORMAL LOW (ref >90)
GFR calc non Af Amer: 9 mL/min — ABNORMAL LOW (ref >90)
Glucose, Bld: 104 mg/dL — ABNORMAL HIGH (ref 70–99)
Potassium: 6 mEq/L — ABNORMAL HIGH (ref 3.5–5.1)
Sodium: 134 mEq/L — ABNORMAL LOW (ref 135–145)
Total Bilirubin: 3 mg/dL — ABNORMAL HIGH (ref 0.3–1.2)
Total Protein: 7.4 g/dL (ref 6.0–8.3)

## 2013-07-19 LAB — BLOOD GAS, ARTERIAL
Acid-base deficit: 4 mmol/L — ABNORMAL HIGH (ref 0.0–2.0)
Bicarbonate: 20.2 meq/L (ref 20.0–24.0)
Drawn by: 249101
O2 Content: 1 L/min
O2 Saturation: 90.1 %
Patient temperature: 98.6
TCO2: 21.3 mmol/L (ref 0–100)
pCO2 arterial: 35 mmHg (ref 35.0–45.0)
pH, Arterial: 7.38 (ref 7.350–7.450)
pO2, Arterial: 63.7 mmHg — ABNORMAL LOW (ref 80.0–100.0)

## 2013-07-19 LAB — URINE MICROSCOPIC-ADD ON

## 2013-07-19 LAB — TROPONIN I
Troponin I: <0.3 ng/mL (ref <0.30)
Troponin I: <0.3 ng/mL (ref <0.30)
Troponin I: <0.3 ng/mL (ref <0.30)
Troponin I: <0.3 ng/mL (ref <0.30)

## 2013-07-19 LAB — COMPREHENSIVE METABOLIC PANEL WITH GFR
ALT: 14 U/L (ref 0–35)
AST: 56 U/L — ABNORMAL HIGH (ref 0–37)
Albumin: 1.2 g/dL — ABNORMAL LOW (ref 3.5–5.2)
Alkaline Phosphatase: 122 U/L — ABNORMAL HIGH (ref 39–117)
BUN: 39 mg/dL — ABNORMAL HIGH (ref 6–23)
CO2: 20 meq/L (ref 19–32)
Calcium: 7.4 mg/dL — ABNORMAL LOW (ref 8.4–10.5)
Chloride: 103 meq/L (ref 96–112)
Creatinine, Ser: 4.53 mg/dL — ABNORMAL HIGH (ref 0.50–1.10)
GFR calc Af Amer: 12 mL/min — ABNORMAL LOW
GFR calc non Af Amer: 10 mL/min — ABNORMAL LOW
Glucose, Bld: 96 mg/dL (ref 70–99)
Potassium: 6.4 meq/L (ref 3.5–5.1)
Sodium: 132 meq/L — ABNORMAL LOW (ref 135–145)
Total Bilirubin: 2.9 mg/dL — ABNORMAL HIGH (ref 0.3–1.2)
Total Protein: 7.2 g/dL (ref 6.0–8.3)

## 2013-07-19 LAB — BASIC METABOLIC PANEL
Calcium: 7.2 mg/dL — ABNORMAL LOW (ref 8.4–10.5)
Creatinine, Ser: 4.34 mg/dL — ABNORMAL HIGH (ref 0.50–1.10)
GFR calc Af Amer: 12 mL/min — ABNORMAL LOW (ref >90)
GFR calc non Af Amer: 11 mL/min — ABNORMAL LOW (ref >90)
Glucose, Bld: 91 mg/dL (ref 70–99)
Potassium: 5.4 mEq/L — ABNORMAL HIGH (ref 3.5–5.1)
Sodium: 133 mEq/L — ABNORMAL LOW (ref 135–145)

## 2013-07-19 LAB — CBC
HCT: 21.1 % — ABNORMAL LOW (ref 36.0–46.0)
HCT: 21.3 % — ABNORMAL LOW (ref 36.0–46.0)
Hemoglobin: 7.6 g/dL — ABNORMAL LOW (ref 12.0–15.0)
MCH: 27.2 pg (ref 26.0–34.0)
MCHC: 36 g/dL (ref 30.0–36.0)
MCV: 75.6 fL — ABNORMAL LOW (ref 78.0–100.0)
MCV: 75.3 fL — ABNORMAL LOW (ref 78.0–100.0)
Platelets: 107 10*3/uL — ABNORMAL LOW (ref 150–400)
Platelets: 120 10*3/uL — ABNORMAL LOW (ref 150–400)
RBC: 2.79 MIL/uL — ABNORMAL LOW (ref 3.87–5.11)
RBC: 2.83 MIL/uL — ABNORMAL LOW (ref 3.87–5.11)
RDW: 17.5 % — ABNORMAL HIGH (ref 11.5–15.5)
RDW: 17.7 % — ABNORMAL HIGH (ref 11.5–15.5)
WBC: 12.2 10*3/uL — ABNORMAL HIGH (ref 4.0–10.5)
WBC: 8.4 10*3/uL (ref 4.0–10.5)

## 2013-07-19 LAB — URINALYSIS, ROUTINE W REFLEX MICROSCOPIC
Glucose, UA: NEGATIVE mg/dL
Ketones, ur: 15 mg/dL — AB
Nitrite: POSITIVE — AB
Specific Gravity, Urine: 1.024 (ref 1.005–1.030)
pH: 5 (ref 5.0–8.0)

## 2013-07-19 LAB — GLUCOSE, CAPILLARY
Glucose-Capillary: 73 mg/dL (ref 70–99)
Glucose-Capillary: 77 mg/dL (ref 70–99)
Glucose-Capillary: 88 mg/dL (ref 70–99)
Glucose-Capillary: 91 mg/dL (ref 70–99)
Glucose-Capillary: 98 mg/dL (ref 70–99)

## 2013-07-19 LAB — AMMONIA: Ammonia: 91 umol/L — ABNORMAL HIGH (ref 11–60)

## 2013-07-19 LAB — CREATININE, URINE, RANDOM: Creatinine, Urine: 287.12 mg/dL

## 2013-07-19 LAB — OCCULT BLOOD X 1 CARD TO LAB, STOOL: Fecal Occult Bld: POSITIVE — AB

## 2013-07-19 LAB — LACTIC ACID, PLASMA: Lactic Acid, Venous: 2.6 mmol/L — ABNORMAL HIGH (ref 0.5–2.2)

## 2013-07-19 MED ORDER — MUPIROCIN 2 % EX OINT
1.0000 "application " | TOPICAL_OINTMENT | Freq: Two times a day (BID) | CUTANEOUS | Status: AC
  Administered 2013-07-19 – 2013-07-24 (×10): 1 via NASAL
  Filled 2013-07-19 (×2): qty 22

## 2013-07-19 MED ORDER — SODIUM CHLORIDE 0.9 % IV BOLUS (SEPSIS)
500.0000 mL | Freq: Once | INTRAVENOUS | Status: AC
  Administered 2013-07-19: 500 mL via INTRAVENOUS

## 2013-07-19 MED ORDER — INSULIN ASPART 100 UNIT/ML ~~LOC~~ SOLN: 0.0000 [IU] | SUBCUTANEOUS | Status: DC

## 2013-07-19 MED ORDER — DEXTROSE 5 % IV SOLN
2.0000 g | Freq: Two times a day (BID) | INTRAVENOUS | Status: DC
  Administered 2013-07-19 (×2): 2 g via INTRAVENOUS
  Filled 2013-07-19 (×4): qty 2

## 2013-07-19 MED ORDER — INSULIN ASPART 100 UNIT/ML IV SOLN
10.0000 [IU] | Freq: Once | INTRAVENOUS | Status: AC
  Administered 2013-07-19: 10 [IU] via INTRAVENOUS
  Filled 2013-07-19: qty 0.1

## 2013-07-19 MED ORDER — DEXAMETHASONE SODIUM PHOSPHATE 10 MG/ML IJ SOLN
10.0000 mg | Freq: Four times a day (QID) | INTRAMUSCULAR | Status: DC
  Administered 2013-07-19 – 2013-07-20 (×2): 10 mg via INTRAVENOUS
  Filled 2013-07-19 (×3): qty 1

## 2013-07-19 MED ORDER — CHLORHEXIDINE GLUCONATE CLOTH 2 % EX PADS
6.0000 | MEDICATED_PAD | Freq: Every day | CUTANEOUS | Status: AC
  Administered 2013-07-20 – 2013-07-24 (×5): 6 via TOPICAL

## 2013-07-19 MED ORDER — VANCOMYCIN HCL 10 G IV SOLR
1750.0000 mg | INTRAVENOUS | Status: DC
  Filled 2013-07-19: qty 1750

## 2013-07-19 MED ORDER — SODIUM POLYSTYRENE SULFONATE 15 GM/60ML PO SUSP
30.0000 g | Freq: Once | ORAL | Status: AC
  Administered 2013-07-19: 30 g via RECTAL
  Filled 2013-07-19: qty 120

## 2013-07-19 MED ORDER — VANCOMYCIN HCL 10 G IV SOLR
2500.0000 mg | Freq: Once | INTRAVENOUS | Status: AC
  Administered 2013-07-19: 2500 mg via INTRAVENOUS
  Filled 2013-07-19: qty 2500

## 2013-07-19 MED ORDER — SODIUM POLYSTYRENE SULFONATE 15 GM/60ML PO SUSP
30.0000 g | Freq: Once | ORAL | Status: DC
  Filled 2013-07-19: qty 120

## 2013-07-19 MED ORDER — DEXTROSE 50 % IV SOLN: 25.0000 mL | INTRAVENOUS | Status: DC | PRN

## 2013-07-19 MED ORDER — SODIUM CHLORIDE 0.9 % IV SOLN
2.0000 g | Freq: Two times a day (BID) | INTRAVENOUS | Status: DC
  Administered 2013-07-19: 2 g via INTRAVENOUS
  Filled 2013-07-19 (×4): qty 2000

## 2013-07-19 MED ORDER — DEXTROSE 5 % IV SOLN
320.0000 mg | INTRAVENOUS | Status: DC
  Administered 2013-07-19: 320 mg via INTRAVENOUS
  Filled 2013-07-19 (×2): qty 6.4

## 2013-07-19 NOTE — Progress Notes (Signed)
CRITICAL VALUE ALERT  Critical value received:  Positive blood cultures taken 12/18- gram positive cocci in clusters found in 3 of 4 bottles  Date of notification:  07/19/13  Time of notification:  10:32  Critical value read back:yes  Nurse who received alert:  Dianna Limbo   MD notified (1st page):  Family Medicine Teaching resident pager [phone]  Time of first page:  10:34  MD notified (2nd page):  Time of second page:  Responding MD:  Family Medicine Teaching resident pager [phone]  Time MD responded:  10:34

## 2013-07-19 NOTE — Progress Notes (Signed)
*  Preliminary Results* Bilateral lower extremity venous duplex completed. Study was technically limited due to poor patient cooperation and patient body habitus. Visualized veins of bilateral lower extremities are negative for deep vein thrombosis. There is no evidence of Baker's cyst bilaterally.  07/19/2013  Gertie Fey, RVT, RDCS, RDMS

## 2013-07-19 NOTE — Progress Notes (Signed)
I called patient's daughter Rodell Perna [phone], to inform her of the status of her mother. I told her she has a serious infection that she is having a lumbar puncture performed and is already started on antibiotics. I also explained that she is have severe kidney failure, which may be related to her liver. Additionally, I stated that with the patient's level of sickness and her altered mental status that I would likely get the critical care team involved. [name] acknowledged understanding and stated "I have been preparing for the worst." Furthermore, she stated that her mother is not on a liver transplant list. Rodell Perna is currently driving to [place] to pick up her sister and expects to be back in [place] by 6AM. I told her that we would keep her updated. No further questions.   Si Raider Clinton Sawyer, MD, MBA 07/19/2013, 4:58 PM Family Medicine Resident, PGY-3 [phone] pager

## 2013-07-19 NOTE — Progress Notes (Signed)
Patient transferred to 3S room 11.  Gave report to 3S nurse.  Patient's daughter Rodell Perna notified of transfer.  Patient belongings sent.  Medications tubed to 3S.  Patient transferred with NT and Rapid Response nurse.

## 2013-07-19 NOTE — Progress Notes (Signed)
S: Went to assess patient after failed attempt at LP in IR. Patient reportedly woke up and was cursing the staff during the procedure.  O: Pulse 100 RR 20 BP 104/47, O2 sat 100% on 5L Gen: Patient was awake and looking around. She was alert and oriented to person, place and year. 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 CCM consult with possible transfer for pressors. Will continue antibiotics and supplemental O2 as well as bolus fluids PRN. Recheck BMET to evaluate persistent hyperkalemia.  Beverely Low, MD, MPH [name] PGY-1 07/19/2013 8:32 PM

## 2013-07-19 NOTE — Consult Note (Signed)
Reason for Consult:AKI, hyperkalemia Referring Physician: Mauricio Po, MD  Colleen Olson is an 52 y.o. female.  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  Pt admitted and noted to have a K of 6.3 and Scr of 4.98.  Since admission her mental status has continued to deteriorate as well as her renal function.  We were asked to help evaluate and manage her AKI/hyperkalemia.  Of note, the pt had been on aldactone prior to admission and initially responded to lasix and kayexalate, however her mental status has continued to deteriorate and was transferred to SDU.  The trend in Scr is seen below:  Trend in Creatinine:  Creatinine, Ser  Date/Time Value Range Status  07/19/2013  2:45 AM 5.13* 0.50 - 1.10 mg/dL Final  21/30/8657  8:46 PM 4.84* 0.50 - 1.10 mg/dL Final  96/29/5284 13:24 PM 4.98* 0.50 - 1.10 mg/dL Final  40/08/270  5:36 PM 1.38*    06/26/2013  2:36 PM 1.83*    05/20/2013  5:00 AM 0.75  0.50 - 1.10 mg/dL Final  64/40/3474  2:59 AM 0.75  0.50 - 1.10 mg/dL Final  56/38/7564  3:32 AM 0.74  0.50 - 1.10 mg/dL Final  95/18/8416  6:06 PM 0.70  0.50 - 1.10 mg/dL Final  30/16/0109 32:35 AM 0.74  0.50 - 1.10 mg/dL Final  5/73/2202 54:27 PM 0.73  0.50 - 1.10 mg/dL Final  0/12/2374  2:83 AM 0.69  0.50 - 1.10 mg/dL Final  08/06/1759  6:07 AM 0.73  0.50 - 1.10 mg/dL Final  10/05/1060  6:94 PM 0.66  0.50 - 1.10 mg/dL Final  03/05/4626  0:35 AM 0.47* 0.50 - 1.10 mg/dL Final  0/0/9381  8:29 AM 0.61  0.50 - 1.10 mg/dL Final  04/03/7168  6:78 AM 0.55  0.50 - 1.10 mg/dL Final  04/03/8100  7:51 PM 0.54  0.50 - 1.10 mg/dL Final  0/25/8527  7:82 PM 0.80  0.40-1.20 mg/dL Final  11/22/5359  4:43 PM 0.67  0.40-1.20 mg/dL Final  1/54/0086  7:61 PM 0.51  0.40-1.20 mg/dL Final    PMH:   Past Medical History  Diagnosis Date  . Asthma   . Hypertension   . Angina   . Osteoarthritis of both knees   . Borderline diabetes   .  Headache(784.0) 12/02/11    "sometimes when I wake up in am; real bad"  . Arthritis   . Chronic lower back pain   . Tachycardia   . Cirrhosis     [name] 07/18/2013  . High cholesterol   . Exertional dyspnea 12/01/11  . Shortness of breath     "all the time right now" (07/18/2013)  . On home oxygen therapy     "2L; 24/7" (07/18/2013)  . Type II diabetes mellitus     "not taking RX anymore" (07/18/2013)  . Anxiety   . Depression   . Acute kidney failure 07/18/2013  . Altered mental status     admitting dx 07/18/2013    PSH:   Past Surgical History  Procedure Laterality Date  . Joint replacement    . Total knee arthroplasty Left 03/02/2011  . Tonsillectomy      "I was a little girl"  . Tubal ligation  1982  . Knee arthroscopy Left 2012    Allergies: No Known Allergies  Medications:   Prior to Admission medications   Medication Sig Start Date End  Date Taking? Authorizing Provider  albuterol (PROAIR HFA) 108 (90 BASE) MCG/ACT inhaler Inhale 2 puffs into the lungs every 6 (six) hours as needed for wheezing or shortness of breath. 06/26/13  Yes Amber Nydia Bouton, MD  calcium carbonate (OS-CAL - DOSED IN MG OF ELEMENTAL CALCIUM) 1250 MG tablet Take 1 tablet by mouth daily.   Yes Historical Provider, MD  cetirizine (ZYRTEC) 10 MG tablet Take 10 mg by mouth daily.   Yes Historical Provider, MD  cyclobenzaprine (FLEXERIL) 10 MG tablet Take 1 tablet (10 mg total) by mouth 2 (two) times daily as needed for muscle spasms. 07/16/13  Yes Kaitlyn Szekalski, PA-C  fluticasone (FLONASE) 50 MCG/ACT nasal spray Place 2 sprays into the nose daily.   Yes Historical Provider, MD  furosemide (LASIX) 20 MG tablet Take 0.5 tablets (10 mg total) by mouth daily. 06/26/13  Yes Amber Nydia Bouton, MD  lactulose (CHRONULAC) 10 GM/15ML solution Take 30 mLs (20 g total) by mouth 2 (two) times daily. 06/26/13  Yes Amber Nydia Bouton, MD  Nutritional Supplements (NUTRITIONAL SHAKE) LIQD Take 1 each by mouth 3 (three)  times daily. 06/26/13  Yes Amber Nydia Bouton, MD  Oxycodone HCl 10 MG TABS Take 1 tablet (10 mg total) by mouth 3 (three) times daily as needed. 06/26/13  Yes Amber Nydia Bouton, MD  oxyCODONE-acetaminophen (PERCOCET/ROXICET) 5-325 MG per tablet Take 2 tablets by mouth every 4 (four) hours as needed for severe pain. 07/16/13  Yes Kaitlyn Szekalski, PA-C  pentoxifylline (TRENTAL) 400 MG CR tablet Take 400 mg by mouth 3 (three) times daily with meals.   Yes Historical Provider, MD  polyethylene glycol (MIRALAX / GLYCOLAX) packet Take 17 g by mouth daily. 05/21/13  Yes Leona Singleton, MD  potassium chloride SA (K-DUR,KLOR-CON) 20 MEQ tablet Take 1 tablet (20 mEq total) by mouth daily. 06/26/13  Yes Amber Nydia Bouton, MD  propranolol (INDERAL) 20 MG tablet Take 1 tablet (20 mg total) by mouth 3 (three) times daily. 06/26/13  Yes Amber Nydia Bouton, MD  senna (SENOKOT) 8.6 MG TABS tablet Take 1 tablet (8.6 mg total) by mouth 2 (two) times daily. 05/21/13  Yes Leona Singleton, MD  spironolactone (ALDACTONE) 100 MG tablet Take 1 tablet (100 mg total) by mouth daily. 06/26/13  Yes Amber Nydia Bouton, MD  venlafaxine (EFFEXOR) 37.5 MG tablet Take 1 tablet (37.5 mg total) by mouth 2 (two) times daily. 06/26/13  Yes Amber Nydia Bouton, MD    Inpatient medications: . acyclovir  320 mg Intravenous Q24H  . ampicillin (OMNIPEN) IV  2 g Intravenous Q12H  . calcium carbonate  1 tablet Oral Daily  . cefTRIAXone (ROCEPHIN)  IV  2 g Intravenous Q12H  . dexamethasone  10 mg Intravenous Q6H  . fluticasone  2 spray Each Nare Daily  . insulin aspart  0-9 Units Subcutaneous Q4H  . lactulose  20 g Oral BID  . propranolol  20 mg Oral TID  . sodium chloride  3 mL Intravenous Q12H  . vancomycin  2,500 mg Intravenous Once   Followed by  . [START ON 07/21/2013] vancomycin  1,750 mg Intravenous Q48H    Discontinued Meds:   Medications Discontinued During This Encounter  Medication Reason  . cefTRIAXone (ROCEPHIN) 1  g in dextrose 5 % 50 mL IVPB   . sodium polystyrene (KAYEXALATE) 15 GM/60ML suspension 15 g   . venlafaxine (EFFEXOR) tablet 37.5 mg   . loratadine (CLARITIN) tablet 10 mg   . 0.9 %  sodium  chloride infusion     Social History:  reports that she has never smoked. She has never used smokeless tobacco. She reports that she drinks alcohol. She reports that she does not use illicit drugs.  Family History:   Family History  Problem Relation Age of Onset  . Diabetes Mother   . Diabetes Father   . Diabetes Sister   . Diabetes Brother     Review of systems not obtained due to patient factors. Weight change:   Intake/Output Summary (Last 24 hours) at 07/19/13 1550 Last data filed at 07/19/13 1520  Gross per 24 hour  Intake 2697.92 ml  Output      0 ml  Net 2697.92 ml   BP 104/62  Pulse 103  Temp(Src) 98.8 F (37.1 C) (Oral)  Resp 24  Ht 5\' 8"  (1.727 m)  Wt 138.5 kg (305 lb 5.4 oz)  BMI 46.44 kg/m2  SpO2 97% Filed Vitals:   07/19/13 0710 07/19/13 1430 07/19/13 1517 07/19/13 1526  BP: 108/55 97/49 104/62   Pulse: 90 103 103   Temp: 98.3 F (36.8 C) 98.8 F (37.1 C)  98.8 F (37.1 C)  TempSrc: Oral Oral  Oral  Resp: 20 25 24    Height:  5\' 8"  (1.727 m)    Weight:  138.5 kg (305 lb 5.4 oz)    SpO2: 98% 95% 97%      General appearance: uncooperative and will open eyes but nonverbal and doesnt follow commands Head: Normocephalic, without obvious abnormality, atraumatic Eyes: positive findings: conjunctiva: +icterus Neck: no adenopathy and no carotid bruit Resp: diminished breath sounds bibasilar Cardio: regular rate and rhythm, no rub and faint HS GI: obese, +BS, soft, some suprapubic fullness, some grimaces to deep palpation in RUQ Extremities: edema tr  Labs: Basic Metabolic Panel:  Recent Labs Lab 07/18/13 1201 07/18/13 1519 07/19/13 0245  NA 131* 134* 134*  K 6.8* 5.8* 6.0*  CL 102 102 104  CO2 21 23 20   GLUCOSE 107* 77 104*  BUN 31* 31* 36*  CREATININE  4.98* 4.84* 5.13*  ALBUMIN 1.6*  --  1.3*  CALCIUM 8.3* 8.3* 7.9*   Liver Function Tests:  Recent Labs Lab 07/18/13 1201 07/19/13 0245  AST 45* 43*  ALT 15 13  ALKPHOS 157* 153*  BILITOT 3.6* 3.0*  PROT 8.1 7.4  ALBUMIN 1.6* 1.3*   No results found for this basename: LIPASE, AMYLASE,  in the last 168 hours  Recent Labs Lab 07/18/13 1201  AMMONIA 52   CBC:  Recent Labs Lab 07/18/13 1201 07/19/13 0245  WBC 13.7* 12.2*  HGB 8.1* 7.6*  HCT 22.8* 21.1*  MCV 77.0* 75.6*  PLT 121* 107*   PT/INR: @LABRCNTIP (inr:5) Cardiac Enzymes: ) Recent Labs Lab 07/18/13 1603 07/18/13 2100 07/19/13 0245 07/19/13 1055  TROPONINI <0.30 <0.30 <0.30 <0.30   CBG:  Recent Labs Lab 07/19/13 0136 07/19/13 0242 07/19/13 0438 07/19/13 0720 07/19/13 1122  GLUCAP 77 88 98 91 113*    Iron Studies: No results found for this basename: IRON, TIBC, TRANSFERRIN, FERRITIN,  in the last 168 hours  Xrays/Other Studies: Dg Chest 1 View  07/18/2013   CLINICAL DATA:  Altered mental status  EXAM: CHEST - 1 VIEW  COMPARISON:  May 17, 2013  FINDINGS: There is no edema or consolidation. Heart is mildly enlarged with normal pulmonary vascularity. No appreciable adenopathy. There is persistent mild elevation the right hemidiaphragm.  IMPRESSION: Cardiac enlargement, stable.  No edema or consolidation.   Electronically Signed  By: Bretta Bang M.D.   On: 07/18/2013 17:40   Dg Hip Complete Left  07/18/2013   CLINICAL DATA:  Left hip pain.  EXAM: LEFT HIP - COMPLETE 2+ VIEW  COMPARISON:  None.  FINDINGS: There is no visible fracture. No dislocation. Pelvic bones are intact as well. The patient was unable to externally rotate the hip.  IMPRESSION: Normal exam.   Electronically Signed   By: Geanie Cooley M.D.   On: 07/18/2013 13:51   Ct Head Wo Contrast  07/18/2013   CLINICAL DATA:  Altered mental status  EXAM: CT HEAD WITHOUT CONTRAST  TECHNIQUE: Contiguous axial images were obtained from  the base of the skull through the vertex without intravenous contrast. Study was obtained within 24 hr of patient's arrival at the emergency department.  COMPARISON:  None.  FINDINGS: The ventricles are normal in size and configuration. There is no mass, hemorrhage, extra-axial fluid collection, or midline shift. The gray-white compartments are normal. No acute infarct identified. The bony calvarium appears intact. The mastoid air cells are clear.  IMPRESSION: Study within normal limits.   Electronically Signed   By: Bretta Bang M.D.   On: 07/18/2013 17:26     Assessment/Plan: 1.  AKI- unclear etiology, however in setting of AMS, worsening liver function, Hepatorenal syndrome is most concerning.  Also contributing may be BOO as her bladder appears full and no foley in place.  Will place foley and follow UOP.  Pt is not a candidate for liver transplant d/t ongoing polysubstance abuse.   1. Would consult PCCM and would transfer to ICU for levophed and vasopressin IV gtt vs. Po midodrine and sq octreotide (not an option due to AMS) and as pt is likely to become more unstable and possibly require intubation due to her lack of airway protection.   2. Would not offer HD as this will not impact her survival if she is not a transplant candidate. 3. Will give bolus of IVF given drop in BP and no sig volume overload on exam.  May enhance K delivery and clearance   4. Palliative care consult to help set goals/limits of care as if she is in HRS, prognosis is grim without transplantation 2. Hyperkalemia- cont with medical management.  Will need NGT and give kayexalate again as well as insulin/D50/bicarb.  Cont to follow.  See above. No EKG changes 3. AMS- as above ?SIRS/HRS/hepatic encephelopathy.  May need to be intubated due to lack of airway protection.  For LP per Primary svc and cultures pending. 4. Hypoxia on ABG- as above consult PCCM 5. Hypotension/SIRS- as above will give bolus of IV fluids and  albumin.  Consult PCCM as above and she will need central line access. 6. Dispo- clinical condition seems to be deteriorating throughout the day.  D/w housestaff.     Gifford Ballon A 07/19/2013, 3:50 PM

## 2013-07-19 NOTE — Procedures (Signed)
Lumbar Puncture Procedure Note  Pre-operative Diagnosis: altered mental status, concern for meningitis  Post-operative Diagnosis: same  Indications: Diagnostic  Procedure Details   Consent: Verbal obtained from patient's daughter Colleen Olson due to patient's altered mental status. Risks of the procedure were discussed including: infection, bleeding, pain and headache. A time-out was performed.   The patient was positioned under sterile conditions. Betadine solution and sterile drapes were utilized. A spinal needle was inserted at the L3 - L4 interspace.  Spinal fluid was not obtained.   Findings N/A  Complications:  None; patient tolerated the procedure well.        Condition: stable  Plan Bed rest for 1 hours. I will contact interventional radiology to see if they are willing to perform an LP.   Si Raider Clinton Sawyer, MD, MBA 07/19/2013, 2:56 PM Family Medicine Resident, PGY-3

## 2013-07-19 NOTE — Progress Notes (Signed)
Family Medicine Interim Progress Note:   Patient seen and evaluated earlier this evening around 8:30pm.  At the time of evaluation, patient had had a hyopglycemic episode with CBG to 53. She was asymptomatic at the time. She received an amp of D50. She also had a desat to 85% on 2L [place] which improved with 3L to 93%. When patient was evaluated she did not have any complaints other than thinking that people were tricking her.  O:  General: alert and oriented to person, place and month, not year and not circumstance of her admisison CV: tachycardic to 100, S1S2, no murmur Pulm: clear to auscultation anteriorly Abdomen: mildly distended, no tenderness Lower extremities: symmetric, pain with dorsiflexion of left foot, trace edema bilaterally Neuro: no focal deficitis, intermittent jerking of the upper extremities  A/P: 52 yo female with h/o cirrhosis from hep C and alcohol abuse who was admitted for encephalopathy of unclear etiology.  Differential includes hepatic encephalopathy, but also may include serotonin syndrome from improper liver metabolism of effexor in setting of liver failure (as thought of by Dr. Birdie Sons).  - will try sips and chips to see if patient able to take po. If so, start lactulose for constipation treatment and possible improvement of encephalopathy (althugh ammonium was normal, making it less likely) - fluids and O2 in case of serotonin syndrome. Will hold on benzo since patient's BP is a little low, but could consider if starts having elevated BP and HR - CXR repeat now that she has had fluids, obtain 2 view to rule out pneumonia as infectious cause of agitation - lower extremity doppler to rule out DVT in setting of tachycardia and increased O2 requirement and since patient cried in pain when left foot was dorsiflexed - monitor  Marena Chancy, PGY-3 Family Medicine Resident

## 2013-07-19 NOTE — Progress Notes (Signed)
INITIAL NUTRITION ASSESSMENT  DOCUMENTATION CODES Per approved criteria  -Morbid Obesity   INTERVENTION: Diet texture and liquid consistency per SLP. Recommend Resource Breeze po BID, each supplement provides 250 kcal and 9 grams of protein, once diet order permits. RD to continue to follow nutrition care plan.  NUTRITION DIAGNOSIS: Inadequate oral intake related to AMS as evidenced by minimal PO intake.   Goal: Intake to meet >90% of estimated nutrition needs.  Monitor:  weight trends, lab trends, I/O's, PO intake, supplement tolerance, SLP evaluation  Reason for Assessment: Malnutrition Screening Tool  52 y.o. female  Admitting Dx: AMS  ASSESSMENT: PMHx significant for cirrhosis 2/2 Hep C and ETOH abuse, asthma, HTN, borderline DM, HA, arthritis, chronic back pain. Admitted with AMS x 4 days. Work-up ongoing.  Pt has been increasingly confused in the last several months but with a waxing/waning presentation. Poor PO intake with no BM in approx 4 days. Used to be able to walk, however has not been able to bear weight, since acute episode of knee pain several weeks ago.   RD reviewed patient's weights - these have been variable. Wt down to 272 lb last month, but per bedscale weight, pt's weight is back up to usual weight of around 312 lb. Difficult to assess muscle and fat mass loss 2/2 obesity.  Potassium is currently elevated at 6.0.   Height: Ht Readings from Last 1 Encounters:  06/13/13 5\' 8"  (1.727 m)    Weight: 312 lb per bedscale  Ideal Body Weight: 140 lb  % Ideal Body Weight: 223%  Wt Readings from Last 10 Encounters:  06/13/13 272 lb (123.378 kg)  05/29/13 278 lb (126.1 kg)  05/19/13 311 lb 8 oz (141.295 kg)  05/06/13 328 lb (148.78 kg)  04/17/13 324 lb (146.965 kg)  04/09/13 329 lb 12.9 oz (149.6 kg)  02/28/13 316 lb (143.337 kg)  01/01/13 320 lb (145.151 kg)  11/30/12 319 lb (144.697 kg)  11/27/12 319 lb (144.697 kg)    Usual Body Weight: 320  lb  % Usual Body Weight: 98%  BMI:  47.5 - Obese Class III  Estimated Nutritional Needs: Kcal: 1600 - 1800 Protein: 70 - 85 g Fluid: 1.6 - 1.8 liters  Skin: intact  Diet Order: NPO  EDUCATION NEEDS: -No education needs identified at this time   Intake/Output Summary (Last 24 hours) at 07/19/13 1203 Last data filed at 07/19/13 0700  Gross per 24 hour  Intake 1106.25 ml  Output      0 ml  Net 1106.25 ml    Last BM: PTA  Labs:   Recent Labs Lab 07/18/13 1201 07/18/13 1519 07/19/13 0245  NA 131* 134* 134*  K 6.8* 5.8* 6.0*  CL 102 102 104  CO2 21 23 20   BUN 31* 31* 36*  CREATININE 4.98* 4.84* 5.13*  CALCIUM 8.3* 8.3* 7.9*  GLUCOSE 107* 77 104*    CBG (last 3)   Recent Labs  07/19/13 0438 07/19/13 0720 07/19/13 1122  GLUCAP 98 91 113*    Scheduled Meds: . calcium carbonate  1 tablet Oral Daily  . fluticasone  2 spray Each Nare Daily  . insulin aspart  0-9 Units Subcutaneous Q4H  . lactulose  20 g Oral BID  . propranolol  20 mg Oral TID  . sodium chloride  3 mL Intravenous Q12H  . vancomycin  2,500 mg Intravenous Once   Followed by  . [START ON 07/21/2013] vancomycin  1,750 mg Intravenous Q48H    Continuous Infusions: .  dextrose 5 % and 0.45% NaCl 125 mL/hr at 07/18/13 2344    Past Medical History  Diagnosis Date  . Asthma   . Hypertension   . Angina   . Osteoarthritis of both knees   . Borderline diabetes   . Headache(784.0) 12/02/11    "sometimes when I wake up in am; real bad"  . Arthritis   . Chronic lower back pain   . Tachycardia   . Cirrhosis     [name] 07/18/2013  . High cholesterol   . Exertional dyspnea 12/01/11  . Shortness of breath     "all the time right now" (07/18/2013)  . On home oxygen therapy     "2L; 24/7" (07/18/2013)  . Type II diabetes mellitus     "not taking RX anymore" (07/18/2013)  . Anxiety   . Depression   . Acute kidney failure 07/18/2013  . Altered mental status     admitting dx 07/18/2013     Past Surgical History  Procedure Laterality Date  . Joint replacement    . Total knee arthroplasty Left 03/02/2011  . Tonsillectomy      "I was a little girl"  . Tubal ligation  1982  . Knee arthroscopy Left 2012    Jarold Motto MS, [place], [place] Pager: [phone] After-hours pager: [phone]

## 2013-07-19 NOTE — Procedures (Addendum)
FMTS Attending Note I was present and attempted bedside LP along with Dr Clinton Sawyer for indications of change in mental status, mildly elevated WBC count, nuchal rigidity.  Patient also with recently reported positive blood cultures GPC in clusters.  She has cirrhotic liver disease as well. Verbal consent for the LP was obtained by Dr Clinton Sawyer by telephone from patient's daughter, Rodell Perna.  Procedure performed in sterile conditions; unable to obtain CSF after two attempts at bedside.  Plan to transfer patient to SDU and initiate broad-spectrum abx to provide empiric coverage for meningitis.  Of note, patient also with a fluid wave in abdomen, although she does not grimace with aggressive abdominal exam (reducing likelihood of SBP).  To obtain repeat CBC, CMet and an ABG, along with ammonia level upon transfer.   L leg eversion noted; prior xray of L hip on 12/18 and of L knee on 12/16 reviewed in chart and both negative for fracture.  Patient appears in pain when L leg is manipulated to turn her in bed; discussed further imaging of L lower extremity to rule out other possible fracture.  She has had negative lower extremity dopplers. Paula Compton, MD Cannot Clinically Determine. Paula Compton, MD

## 2013-07-19 NOTE — Clinical Documentation Improvement (Signed)
THIS DOCUMENT IS NOT A PERMANENT PART OF THE MEDICAL RECORD  Please update your documentation with the medical record to reflect your response to this query. If you need help knowing how to do this please call [phone].  07/19/13  Dear Dr. Mauricio Po Marton [place],  In a better effort to capture your patient's severity of illness, reflect appropriate length of stay and utilization of resources, a review of the patient medical record has revealed the following indicators.    Based on your clinical judgment, please clarify and document in a progress note and/or discharge summary the clinical condition associated with the following supporting information:  In responding to this query please exercise your independent judgment.  The fact that a query is asked, does not imply that any particular answer is desired or expected.   Possible Clinical Conditions?  Acute on Chronic Respiratory Failure  Chronic Respiratory Failure  Other Condition  Cannot Clinically Determine      Risk Factors: Patient with a history of asthma, is on home O2 @ 2L per 12/18 progress notes.                 You may use possible, probable, or suspect with inpatient documentation. possible, probable, suspected diagnoses MUST be documented at the time of discharge  Reviewed: additional documentation in the medical record  Thank You,  Marciano Sequin, Clinical Documentation Specialist: [phone] Health Information Management [place]

## 2013-07-19 NOTE — Evaluation (Signed)
Clinical/Bedside Swallow Evaluation Patient Details  Name: Colleen Olson MRN: [id number] Date of Birth: [birth year]  Today's Date: 07/19/2013 Time: 4098-1191 SLP Time Calculation (min): 25 min  Past Medical History:  Past Medical History  Diagnosis Date  . Asthma   . Hypertension   . Angina   . Osteoarthritis of both knees   . Borderline diabetes   . Headache(784.0) 12/02/11    "sometimes when I wake up in am; real bad"  . Arthritis   . Chronic lower back pain   . Tachycardia   . Cirrhosis     Colleen Olson 07/18/2013  . High cholesterol   . Exertional dyspnea 12/01/11  . Shortness of breath     "all the time right now" (07/18/2013)  . On home oxygen therapy     "2L; 24/7" (07/18/2013)  . Type II diabetes mellitus     "not taking RX anymore" (07/18/2013)  . Anxiety   . Depression   . Acute kidney failure 07/18/2013  . Altered mental status     admitting dx 07/18/2013   Past Surgical History:  Past Surgical History  Procedure Laterality Date  . Joint replacement    . Total knee arthroplasty Left 03/02/2011  . Tonsillectomy      "I was a little girl"  . Tubal ligation  1982  . Knee arthroscopy Left 2012   HPI:  Colleen Olson is a 52 y.o. female presented to hospital with AMS and acute changes in functional status over the past 4 dasy. 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 No overt s/s aspiration noted and patient consumed thin liquids via cup sips and straw sips with assistance by SLP. Suspect that patient will tolerate trials of upgraded solid textures at bedside as her cognitive status improves.     Aspiration Risk  Mild    Diet Recommendation Thin liquid   Supervision: Staff to assist  with self feeding;Full supervision/cueing for compensatory strategies Compensations: Slow rate;Small sips/bites Postural Changes and/or Swallow Maneuvers: Seated upright 90 degrees    Other  Recommendations Oral Care Recommendations: Oral care BID   Follow Up Recommendations  Other (comment) (pending progress)    Frequency and Duration min 2x/week  1 week   Pertinent Vitals/Pain     SLP Swallow Goals  1.Patient will utilize recommended strategies during swallow to increase swallowing safety with moderate cues.    2. Patient will tolerate trials of upgraded solid consistency P.O.'s with no overt s/s aspiration.   Swallow Study Prior Functional Status       General Date of Onset: 07/18/13 HPI: Colleen Olson is a 52 y.o. female presented to hospital with AMS and acute changes in functional status over the past 4 dasy. 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 secondary to AMS) Labial Symmetry: Within Functional Limits Lingual ROM:  (reduced lingual ROM, ? secondary to AMS)   Ice Chips Ice chips: Not tested   Thin Liquid Thin Liquid: Within functional  limits Presentation: Cup;Straw Other Comments: No overt s/s aspiration with cup sip or straw sip of thin liquids.     Nectar Thick Nectar Thick Liquid: Not tested   Honey Thick Honey Thick Liquid: Not tested   Puree Puree: Impaired Presentation: Spoon Oral Phase Impairments: Reduced lingual movement/coordination;Poor awareness of bolus Oral Phase Functional Implications: Oral holding Pharyngeal Phase Impairments: Suspected delayed Swallow Other Comments: Patient refused more than 2 1/4 tsp  size bites of puree texture and suspect that this and oral holding are secondary to AMS and decreased cognitive status at this time.   Solid   GO    Solid: Impaired Oral Phase Impairments: Reduced lingual movement/coordination;Poor awareness of bolus Oral Phase Functional Implications: Oral holding Other Comments: Oral cavity fully cleared after SLP provided patient with thin liquids to sip after initial swallow.       Colleen Olson 07/19/2013,12:40 PM    Angela Nevin, MA, CCC-SLP [facility] Speech-Language Pathologist

## 2013-07-19 NOTE — Progress Notes (Addendum)
ANTIBIOTIC CONSULT NOTE - INITIAL  Pharmacy Consult for Vancomycin, Acyclovir, Ampicillin, and Rocephin Indication: GPC bacteremia, r/o meningitis  No Known Allergies  Patient Measurements:   Weight ~ 123 kg Height ~ 68 inches IBW: 64 kg  Vital Signs: Temp: 98.3 F (36.8 C) (12/19 0710) Temp src: Oral (12/19 0710) BP: 108/55 mmHg (12/19 0710) Pulse Rate: 90 (12/19 0710) Intake/Output from previous day: 12/18 0701 - 12/19 0700 In: 1106.3 [I.V.:1106.3] Out: 0  Intake/Output from this shift:    Labs:  Recent Labs  07/18/13 1201 07/18/13 1519 07/19/13 0245  WBC 13.7*  --  12.2*  HGB 8.1*  --  7.6*  PLT 121*  --  107*  CREATININE 4.98* 4.84* 5.13*   Estimated CrCl ~ 14 ml/min   Microbiology: Recent Results (from the past 720 hour(s))  CULTURE, BLOOD (ROUTINE X 2)     Status: None   Collection Time    07/18/13  4:55 PM      Result Value Range Status   Specimen Description BLOOD ARM RIGHT   Final   Special Requests BOTTLES DRAWN AEROBIC AND ANAEROBIC 10CC   Final   Culture  Setup Time     Final   Value: 07/19/2013 02:04     Performed at [facility]   Culture     Final   Value: GRAM POSITIVE COCCI IN CLUSTERS     Note: Gram Stain Report Called to,Read Back By and Verified With: Dianna Limbo 07/19/13 1030 BY SMITHERSJ     Performed at [facility]   Report Status PENDING   Incomplete    Medical History: Past Medical History  Diagnosis Date  . Asthma   . Hypertension   . Angina   . Osteoarthritis of both knees   . Borderline diabetes   . Headache(784.0) 12/02/11    "sometimes when I wake up in am; real bad"  . Arthritis   . Chronic lower back pain   . Tachycardia   . Cirrhosis     Colleen Olson 07/18/2013  . High cholesterol   . Exertional dyspnea 12/01/11  . Shortness of breath     "all the time right now" (07/18/2013)  . On home oxygen therapy     "2L; 24/7" (07/18/2013)  . Type II diabetes mellitus     "not taking RX anymore"  (07/18/2013)  . Anxiety   . Depression   . Acute kidney failure 07/18/2013  . Altered mental status     admitting dx 07/18/2013    Medications:  Scheduled:  . calcium carbonate  1 tablet Oral Daily  . fluticasone  2 spray Each Nare Daily  . insulin aspart  0-9 Units Subcutaneous Q4H  . lactulose  20 g Oral BID  . propranolol  20 mg Oral TID  . sodium chloride  3 mL Intravenous Q12H   Assessment: 51 yo F admitted 12/18 with 4d history of AMS.  Empirically started on Rocephin for possible infection.  Blood cx now with 1/2 GPC in clusters and abx broadened to Vancomycin, Acyclovir, Ampicillin, and Rocephin to cover bacteremia +/- meningitis.  Of note, patient also presented with ARF, SCr 4.98 on admission (baseline 1.4).  SCr currently 5.13 with estimated CrCl ~ 14 ml/min.  Renal consult in progess.  Goal of Therapy:  Vancomycin trough level 15-20 mcg/ml Renal dose adjustment of antibiotics Eradication of infection  Plan:  1. Vancomycin 2500 mg IV x 1 (loading dose), followed by Vancomycin 1750 mg IV q48h 2. Acyclovir 320  mg IV q24h (5mg /kg q24h using IBW) 3. Rocephin 2gm IV q12h 4. Ampicillin 2gm IV q12h 5. Follow culture data, clinical progress, and changes in renal function (BMET ordered with AM labs). 6. Monitor renal function closely with the addition of nephrotoxins. 7. Check Vancomycin trough levels as indicated.  [employer], Pharm.D., BCPS Clinical Pharmacist Pager [phone] 07/19/2013 11:29 AM

## 2013-07-19 NOTE — Progress Notes (Addendum)
Family Medicine Teaching Service Daily Progress Note Intern Pager: [phone]  Patient name: Colleen Olson Medical record number: [id number] Date of birth: Dec 02, 1960 Age: 52 y.o. Gender: female  Primary Care Provider: Rodman Pickle, MD Consultants: Nephrology Code Status: Full  Pt Overview and Major Events to Date:  07/18/13 - pt admitted with AMS, severe AKI 07/19/13 - blood cx grow GPC start anti-bx, consult renal, plan to attempt LP  Assessment and Plan: Colleen Olson is a 52 y.o. female presenting with AMS and acute changes in functional status over the past 4 dasy. 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 ETOH abuse, Asthma, HTN, Borderline DM, HA, Arthritis, Chronic back pain  # Altered Mental Status - not improving since admission, differential including hepatic encephalopathy, opiate ingestion, but now most concerning for meningitis vs encephalitis with + blood cx and leukocytosis; r/o MI; CT normal - Cont lactulose - Cont NPO - Start infectious disease work up documented below  # Bacteremia - gram + cocci growing in clusters in culture, presume MRSA; possible spread from CNS, infectious arthritis also possible given severe pain of left knee - Start Vanc, CTX, and Amp for broad coverage of MRSA, strep, GNR, and listeria given cirrhosis; Acyclovir for HSV - Attempt LP today - F/u urine culture  # AKI: Cr 4.98 in ED and trending up; Cr noted to be 1.38 on 12/1,possibility of hepatorenal syndrome given worsening liver fxn  - rehydrate with NS @125ml /hr (not JYN:WGNFA < 10 so less likely pre-renal) - consult nephrology and appreciate all assistance  # Hyperkalemia - admission K+ 6.8, improved to 6.0 after kayexalate, likely due to AKI  - Pt AMS prevents PO administration of kayexalate  - Will order kayexalate suppository   # Cirrhosis (2/2 Hep C and ETOH)- saw Hepatologist about 3 weeks ago. No recent ingestions per daughter. Ammonia 52 in ED. Scleral icterus  present on exam. Pt on lasix and spirnolactone at home (per report took all her medications this morning). No evidence of bruising or purpura on exam plt 121 today. INR 1.99, PT 22.0  -tox screen negative except opiates  -will hold home spironolactone and lasix given renal failure  -trend with CMETs  -consider touching base with hepatology  -lactulose BID, titrate to BMs, strict i/os and daily weights  -FOBT  # Anemia, Microcytic - pt with worsening of chronic microcytic anemia, positive FOBT in Oct 2014 and likely to have GI bleed now in setting of INR 2.0 - Check FOBT - Recheck in hgb in PM with transfusion threshold at 7 - Consider, protonix IV given possible GI bleed  # Asthma on O2- On 2LNC at home. Daughter had noticed pt with increased WOB lately and some gasping sensation. No positional relationship. Difficult to examine lungs 2/2 pt screaming  -cont albuterol prn  -home o2 requirement  -pulse ox continuous  -monitor vs  -loratidine, flonase prn   #Right knee pain- s/p TKR. No acute exacerbating factors. Pt daughter report has not ambulated since surgery.  - Hold chronic pain meds given AMS   FEN/GI: NPO, cont NS at 125 ml/hr PPx: INR 2, no anticoagulant  Disposition: move pt to stepdown  Subjective: cannot speak in coherent sentences, when asked specifically if she has pain in her neck she repsonds "yes," otherwise she did not speak  Objective: Temp:  [97.9 F (36.6 C)-98.9 F (37.2 C)] 98.3 F (36.8 C) (12/19 0710) Pulse Rate:  [90-124] 90 (12/19 0710) Resp:  [14-23] 20 (12/19 0710) BP: (88-148)/(36-118)  108/55 mmHg (12/19 0710) SpO2:  [90 %-100 %] 98 % (12/19 0710) Physical Exam: General: obese, ill appearing, altered mentation but fully awake and alert HEENT: NCAT, dry MMM, PERRL, maintains neck in fixed position and grimaces and groans with palpation and attempted flexion Cardiovascular: RRR, no m/r/g  Respiratory: CTAB (when pt not screaming) no evidence of  increased WOB  Abdomen: obese, palpable fluid wave, no peritoneal signs, no rebound/guarding, soft, nd  Extremities: WWP, bilat edema up to mid shin, with evidence of TKR on left (pt not wanting to move leg)  Skin: no evidence of bruising or bleeding  Neuro: cannot perform tasks and does not respond to orientation questions, completely alert and awake  Laboratory:  Recent Labs Lab 07/18/13 1201 07/19/13 0245  WBC 13.7* 12.2*  HGB 8.1* 7.6*  HCT 22.8* 21.1*  PLT 121* 107*    Recent Labs Lab 07/18/13 1201 07/18/13 1519 07/19/13 0245  NA 131* 134* 134*  K 6.8* 5.8* 6.0*  CL 102 102 104  CO2 21 23 20   BUN 31* 31* 36*  CREATININE 4.98* 4.84* 5.13*  CALCIUM 8.3* 8.3* 7.9*  PROT 8.1  --  7.4  BILITOT 3.6*  --  3.0*  ALKPHOS 157*  --  153*  ALT 15  --  13  AST 45*  --  43*  GLUCOSE 107* 77 104*    Blood Culture positive : GPC  Imaging/Diagnostic Tests: Dg Chest 1 View  07/18/2013   CLINICAL DATA:  Altered mental status  EXAM: CHEST - 1 VIEW  COMPARISON:  May 17, 2013  FINDINGS: There is no edema or consolidation. Heart is mildly enlarged with normal pulmonary vascularity. No appreciable adenopathy. There is persistent mild elevation the right hemidiaphragm.  IMPRESSION: Cardiac enlargement, stable.  No edema or consolidation.   Electronically Signed   By: Bretta Bang M.D.   On: 07/18/2013 17:40   Ct Head Wo Contrast  07/18/2013   IMPRESSION: Study within normal limits.   Electronically Signed   By: Bretta Bang M.D.   On: 07/18/2013 17:26    Si Raider. Clinton Sawyer, MD, MBA 07/19/2013, 12:56 PM PGY-3, [facility] Family Medicine FPTS Intern pager: [phone] Amion password: [email or web address], text pages welcome

## 2013-07-19 NOTE — Progress Notes (Signed)
FMTS Attending Note Patient seen and examined by me, discussed with resident team.  Today Colleen Olson is alert and awake, less verbal than yesterday.  Appears to be holding her neck rigid, will not let me flex her neck passively because of pain.  She has a mild WBC elevation today.  No documented fevers.  Given her apparent nuchal rigidity, change in mental status and now with positive blood cultures, we will plan for treatment for possible CNS infection and obtain LP.  Attempt at bedside; if unsuccessful to consult IR for assistance.  To discuss plan and obtain consent from patient's daughter, who is not at bedside today but is her primary care giver.  Paula Compton, MD

## 2013-07-20 ENCOUNTER — Inpatient Hospital Stay (HOSPITAL_COMMUNITY): Payer: Medicare Other

## 2013-07-20 DIAGNOSIS — R4182 Altered mental status, unspecified: Secondary | ICD-10-CM

## 2013-07-20 DIAGNOSIS — B958 Unspecified staphylococcus as the cause of diseases classified elsewhere: Secondary | ICD-10-CM

## 2013-07-20 DIAGNOSIS — R799 Abnormal finding of blood chemistry, unspecified: Secondary | ICD-10-CM

## 2013-07-20 DIAGNOSIS — K767 Hepatorenal syndrome: Secondary | ICD-10-CM

## 2013-07-20 DIAGNOSIS — R7881 Bacteremia: Secondary | ICD-10-CM

## 2013-07-20 DIAGNOSIS — A419 Sepsis, unspecified organism: Secondary | ICD-10-CM

## 2013-07-20 DIAGNOSIS — N179 Acute kidney failure, unspecified: Secondary | ICD-10-CM

## 2013-07-20 LAB — BLOOD GAS, VENOUS
Bicarbonate: 25.5 mEq/L — ABNORMAL HIGH (ref 20.0–24.0)
FIO2: 0.4 %
O2 Content: 2 L/min
O2 Saturation: 76.4 %
TCO2: 21.9 mmol/L (ref 0–100)
pCO2, Ven: 32.8 mmHg — ABNORMAL LOW (ref 45.0–50.0)
pH, Ven: 7.419 — ABNORMAL HIGH (ref 7.250–7.300)
pO2, Ven: 45.4 mmHg — ABNORMAL HIGH (ref 30.0–45.0)
pO2, Ven: 44.7 mmHg (ref 30.0–45.0)

## 2013-07-20 LAB — RENAL FUNCTION PANEL
Albumin: 1.6 g/dL — ABNORMAL LOW (ref 3.5–5.2)
CO2: 18 mEq/L — ABNORMAL LOW (ref 19–32)
Calcium: 7.6 mg/dL — ABNORMAL LOW (ref 8.4–10.5)
GFR calc Af Amer: 14 mL/min — ABNORMAL LOW (ref >90)
GFR calc non Af Amer: 12 mL/min — ABNORMAL LOW (ref >90)
Phosphorus: 6 mg/dL — ABNORMAL HIGH (ref 2.3–4.6)
Potassium: 5.5 mEq/L — ABNORMAL HIGH (ref 3.5–5.1)
Sodium: 133 mEq/L — ABNORMAL LOW (ref 135–145)

## 2013-07-20 LAB — GLUCOSE, CAPILLARY
Glucose-Capillary: 126 mg/dL — ABNORMAL HIGH (ref 70–99)
Glucose-Capillary: 119 mg/dL — ABNORMAL HIGH (ref 70–99)
Glucose-Capillary: 120 mg/dL — ABNORMAL HIGH (ref 70–99)

## 2013-07-20 LAB — CBC
Hemoglobin: 7.5 g/dL — ABNORMAL LOW (ref 12.0–15.0)
MCH: 27.5 pg (ref 26.0–34.0)
MCHC: 36.6 g/dL — ABNORMAL HIGH (ref 30.0–36.0)
Platelets: 86 10*3/uL — ABNORMAL LOW (ref 150–400)
RBC: 2.73 MIL/uL — ABNORMAL LOW (ref 3.87–5.11)

## 2013-07-20 LAB — BASIC METABOLIC PANEL
BUN: 42 mg/dL — ABNORMAL HIGH (ref 6–23)
CO2: 20 mEq/L (ref 19–32)
Calcium: 7.6 mg/dL — ABNORMAL LOW (ref 8.4–10.5)
Chloride: 104 mEq/L (ref 96–112)
Creatinine, Ser: 3.52 mg/dL — ABNORMAL HIGH (ref 0.50–1.10)
GFR calc Af Amer: 16 mL/min — ABNORMAL LOW (ref >90)
Glucose, Bld: 107 mg/dL — ABNORMAL HIGH (ref 70–99)
Potassium: 5.2 mEq/L — ABNORMAL HIGH (ref 3.5–5.1)
Sodium: 135 mEq/L (ref 135–145)

## 2013-07-20 LAB — TROPONIN I
Troponin I: <0.3 ng/mL (ref <0.30)
Troponin I: <0.3 ng/mL (ref <0.30)

## 2013-07-20 LAB — HIV ANTIBODY (ROUTINE TESTING W REFLEX): HIV: NONREACTIVE

## 2013-07-20 MED ORDER — ALBUMIN HUMAN 25 % IV SOLN
25.0000 g | Freq: Three times a day (TID) | INTRAVENOUS | Status: DC
  Filled 2013-07-20: qty 100

## 2013-07-20 MED ORDER — ALBUMIN HUMAN 25 % IV SOLN
25.0000 g | Freq: Three times a day (TID) | INTRAVENOUS | Status: DC
Start: 2013-07-20 — End: 2013-07-20
  Administered 2013-07-20: 25 g via INTRAVENOUS
  Filled 2013-07-20 (×4): qty 100

## 2013-07-20 MED ORDER — SODIUM CHLORIDE 0.9 % IV SOLN
2.0000 g | Freq: Three times a day (TID) | INTRAVENOUS | Status: AC
  Administered 2013-07-20 – 2013-07-24 (×15): 2 g via INTRAVENOUS
  Filled 2013-07-20 (×16): qty 2000

## 2013-07-20 MED ORDER — LACTULOSE 10 GM/15ML PO SOLN
20.0000 g | Freq: Three times a day (TID) | ORAL | Status: DC
  Administered 2013-07-20 – 2013-07-23 (×10): 20 g via ORAL
  Filled 2013-07-20 (×12): qty 30

## 2013-07-20 MED ORDER — ALBUMIN HUMAN 25 % IV SOLN
25.0000 g | Freq: Four times a day (QID) | INTRAVENOUS | Status: DC
  Administered 2013-07-20 – 2013-07-22 (×9): 25 g via INTRAVENOUS
  Filled 2013-07-20 (×17): qty 100

## 2013-07-20 MED ORDER — SODIUM BICARBONATE 8.4 % IV SOLN
INTRAVENOUS | Status: DC
  Administered 2013-07-20 – 2013-07-22 (×3): via INTRAVENOUS
  Filled 2013-07-20 (×8): qty 850

## 2013-07-20 MED ORDER — MIDODRINE HCL 5 MG PO TABS: 10.0000 mg | ORAL_TABLET | Freq: Three times a day (TID) | ORAL | Status: DC

## 2013-07-20 MED ORDER — OCTREOTIDE ACETATE 100 MCG/ML IJ SOLN: 100.0000 ug | Freq: Three times a day (TID) | INTRAMUSCULAR | Status: DC

## 2013-07-20 NOTE — Progress Notes (Signed)
6:20 AM  Ampicillin for meningitis dosing. Current renal function is ~3ml/min GFR. Increasing ampicillin to 2gm q8h for crcl 10-30  With respective dose for active meningitis until R/O will continue to monitor bmets and changing RF  Janice Coffin

## 2013-07-20 NOTE — Progress Notes (Signed)
Patient ID: Colleen Olson, female   DOB: [birth year], 52 y.o.   MRN: [id number] S:Pt is more awake this am, verbal but still disoriented.  Transferred to ICU after she became less responsive and more hypotensive last night.  Unable to perform LP yesterday.  Overall more stable today. O:BP 118/58  Pulse 99  Temp(Src) 98.4 F (36.9 C) (Oral)  Resp 15  Ht 5\' 8"  (1.727 m)  Wt 139.4 kg (307 lb 5.1 oz)  BMI 46.74 kg/m2  SpO2 98%  Intake/Output Summary (Last 24 hours) at 07/20/13 0742 Last data filed at 07/20/13 4098  Gross per 24 hour  Intake 2902.24 ml  Output    580 ml  Net 2322.24 ml   Intake/Output: I/O last 3 completed shifts: In: 4008.5 [I.V.:2652.1; IV Piggyback:1356.4] Out: 580 [Urine:580]  Intake/Output this shift:    Weight change:  Gen:WD obese AAF lethargic but arousable, delirious CVS:rrr, no rub Resp:cta JXB:JYNWG Ext:tr edema   Recent Labs Lab 07/18/13 1201 07/18/13 1519 07/19/13 0245 07/19/13 1636 07/19/13 2138 07/20/13 0400  NA 131* 134* 134* 132* 133* 133*  K 6.8* 5.8* 6.0* 6.4* 5.4* 5.5*  CL 102 102 104 103 104 104  CO2 21 23 20 20 21  18*  GLUCOSE 107* 77 104* 96 91 126*  BUN 31* 31* 36* 39* 42* 41*  CREATININE 4.98* 4.84* 5.13* 4.53* 4.34* 3.85*  ALBUMIN 1.6*  --  1.3* 1.2*  --  1.6*  CALCIUM 8.3* 8.3* 7.9* 7.4* 7.2* 7.6*  PHOS  --   --   --   --   --  6.0*  AST 45*  --  43* 56*  --   --   ALT 15  --  13 14  --   --    Liver Function Tests:  Recent Labs Lab 07/18/13 1201 07/19/13 0245 07/19/13 1636 07/20/13 0400  AST 45* 43* 56*  --   ALT 15 13 14   --   ALKPHOS 157* 153* 122*  --   BILITOT 3.6* 3.0* 2.9*  --   PROT 8.1 7.4 7.2  --   ALBUMIN 1.6* 1.3* 1.2* 1.6*   No results found for this basename: LIPASE, AMYLASE,  in the last 168 hours  Recent Labs Lab 07/18/13 1201 07/19/13 1636  AMMONIA 52 91*   CBC:  Recent Labs Lab 07/18/13 1201 07/19/13 0245 07/19/13 1636 07/20/13 0400  WBC 13.7* 12.2* 8.4 9.0  HGB 8.1* 7.6* 7.6*  7.5*  HCT 22.8* 21.1* 21.3* 20.5*  MCV 77.0* 75.6* 75.3* 75.1*  PLT 121* 107* 120* 86*   Cardiac Enzymes:  Recent Labs Lab 07/19/13 0245 07/19/13 1055 07/19/13 1636 07/19/13 2138 07/20/13 0400  TROPONINI <0.30 <0.30 <0.30 <0.30 <0.30   CBG:  Recent Labs Lab 07/19/13 1122 07/19/13 2018 07/19/13 2342 07/20/13 0327 07/20/13 0423  GLUCAP 113* 77 114* 126* 119*    Iron Studies: No results found for this basename: IRON, TIBC, TRANSFERRIN, FERRITIN,  in the last 72 hours Studies/Results: Dg Chest 1 View  07/18/2013   CLINICAL DATA:  Altered mental status  EXAM: CHEST - 1 VIEW  COMPARISON:  May 17, 2013  FINDINGS: There is no edema or consolidation. Heart is mildly enlarged with normal pulmonary vascularity. No appreciable adenopathy. There is persistent mild elevation the right hemidiaphragm.  IMPRESSION: Cardiac enlargement, stable.  No edema or consolidation.   Electronically Signed   By: Bretta Bang M.D.   On: 07/18/2013 17:40   Dg Hip Complete Left  07/18/2013   CLINICAL DATA:  Left hip pain.  EXAM: LEFT HIP - COMPLETE 2+ VIEW  COMPARISON:  None.  FINDINGS: There is no visible fracture. No dislocation. Pelvic bones are intact as well. The patient was unable to externally rotate the hip.  IMPRESSION: Normal exam.   Electronically Signed   By: Geanie Cooley M.D.   On: 07/18/2013 13:51   Ct Head Wo Contrast  07/18/2013   CLINICAL DATA:  Altered mental status  EXAM: CT HEAD WITHOUT CONTRAST  TECHNIQUE: Contiguous axial images were obtained from the base of the skull through the vertex without intravenous contrast. Study was obtained within 24 hr of patient's arrival at the emergency department.  COMPARISON:  None.  FINDINGS: The ventricles are normal in size and configuration. There is no mass, hemorrhage, extra-axial fluid collection, or midline shift. The gray-white compartments are normal. No acute infarct identified. The bony calvarium appears intact. The mastoid air  cells are clear.  IMPRESSION: Study within normal limits.   Electronically Signed   By: Bretta Bang M.D.   On: 07/18/2013 17:26   Dg Fluoro Guide Lumbar Puncture  07/19/2013   CLINICAL DATA:  Altered mental status.  EXAM: DIAGNOSTIC LUMBAR PUNCTURE UNDER FLUOROSCOPIC GUIDANCE  FLUOROSCOPY TIME:  1 min, 27 seconds  PROCEDURE: Informed consent was obtained from the patient prior to the procedure, including potential complications of headache, allergy, and pain. With the patient prone, the lower back was prepped with Betadine. 1% Lidocaine was used for local anesthesia. Lumbar puncture was attempted initially at the L4-5 level using a gauge needle. Despite multiple attempts, this was unsuccessful. Therefore, attempts were made at L3-4, again with unsuccessful return of CSF. At this point, a cross-table lateral view of the lumbar spine was obtained which demonstrates the medial at the appropriate depth of the spinal canal. Given no return of CSF, a 3rd attempt was made at the L2-3 level, also unsuccessful.  IMPRESSION: Multiple attempts that lumbar puncture with fluoroscopic guidance for unsuccessful less described above. This was discussed with Dr. Clinton Sawyer at the time of the procedure.   Electronically Signed   By: Charlett Nose M.D.   On: 07/19/2013 19:20   . albumin human  25 g Intravenous Q6H  . ampicillin (OMNIPEN) IV  2 g Intravenous Q8H  . calcium carbonate  1 tablet Oral Daily  . cefTRIAXone (ROCEPHIN)  IV  2 g Intravenous Q12H  . Chlorhexidine Gluconate Cloth  6 each Topical Q0600  . fluticasone  2 spray Each Nare Daily  . lactulose  20 g Oral TID  . mupirocin ointment  1 application Nasal BID  . sodium chloride  3 mL Intravenous Q12H  . sodium polystyrene  30 g Per Tube Once  . [START ON 07/21/2013] vancomycin  1,750 mg Intravenous Q48H    BMET    Component Value Date/Time   NA 133* 07/20/2013 0400   K 5.5* 07/20/2013 0400   CL 104 07/20/2013 0400   CO2 18* 07/20/2013 0400    GLUCOSE 126* 07/20/2013 0400   BUN 41* 07/20/2013 0400   CREATININE 3.85* 07/20/2013 0400   CREATININE 1.38* 07/01/2013 1515   CALCIUM 7.6* 07/20/2013 0400   GFRNONAA 12* 07/20/2013 0400   GFRAA 14* 07/20/2013 0400   CBC    Component Value Date/Time   WBC 9.0 07/20/2013 0400   RBC 2.73* 07/20/2013 0400   RBC 3.28* 04/07/2013 2025   HGB 7.5* 07/20/2013 0400   HCT 20.5* 07/20/2013 0400   PLT 86* 07/20/2013 0400   MCV 75.1*  07/20/2013 0400   MCH 27.5 07/20/2013 0400   MCHC 36.6* 07/20/2013 0400   RDW 17.5* 07/20/2013 0400   LYMPHSABS 2.2 04/07/2013 1542   MONOABS 0.7 04/07/2013 1542   EOSABS 0.1 04/07/2013 1542   BASOSABS 0.1 04/07/2013 1542     Assessment/Plan:  1. AKI- unclear etiology, however in setting of AMS, worsening liver function, Hepatorenal syndrome is most concerning, however ATN is also on DDx.  Ordered urine studies to help evaluate further and FeNa is 0.27% c/w pre-renal and/or hepatorenal, however she appears more stable today.  Some UOP overnight.   1. Would start IVF's of isotonic bicarb and BP support.  Albumin may have helped BP, mental status, and UOP.  (Pt is not a candidate for liver transplant d/t ongoing polysubstance abuse).  2. Appreciate PCCM input.  Pt is s/p transfer to ICU for levophed and vasopressin IV gtt however patient's daughter wanted to wait before placement of central line and now she has improved with albumin.  Would hold off on levophed and see how she responds to IVF's.   3. Consider Po midodrine 10mg  tid and sq octreotide SQ tid now that her MS has improved.    4. Would not offer HD as this will not impact her survival if she is not a transplant candidate.  5. Palliative care consult to help set goals/limits of care as if she is in HRS, prognosis is grim without transplantation 2. Hyperkalemia- cont with medical management. Will need NGT and give kayexalate again as well as insulin/D50/bicarb. Cont to follow. See above. No EKG  changes 3. Metabolic acidosis- start bicarb gtt 4. Cirrhosis- elevated INR and LFT's, per primary svc 5. AMS- as above.  She has improved with albumin and better BP.  ?meningitis/SIRS/HRS/hepatic encephelopathy (NH3 elevated to 91). May need to be intubated due to lack of airway protection if she becomes unresponsive again. For LP per Primary svc and cultures pending. 6. Hypoxia on ABG- as above consult PCCM 7. Hypotension/SIRS- as above will give bolus of IV fluids and albumin. PCCM consulted as above and she will need central line access. 8. Dispo- palliative care consult as above  Zillah Alexie A

## 2013-07-20 NOTE — Consult Note (Addendum)
PULMONARY  / CRITICAL CARE MEDICINE CONSULTATION  Name: Colleen Olson MRN: [id number] DOB: 1961/02/14    ADMISSION DATE:  07/18/2013  REASON FOR CONSULT: hepatorenal syndrome with need for octreotide/norepinephrine, concern for airway protection  REQUESTING PHYSICIAN: Dr. Mauricio Po  CHIEF COMPLAINT:  Altered mental status  BRIEF PATIENT DESCRIPTION: Colleen Olson is a 52 year old woman with cirrhosis who was admitted with AMS and found to have 2/2 GPCs in clusters from blood cultures, with renal failure possibly due to HRS, who is being seen at the request of Dr. Mauricio Po for ICU transfer.   SIGNIFICANT EVENTS / STUDIES:  1. 12/18  Admitted with AMS  2. 12/19 positive blood cultues, abx started, LP attempted unsuccessfully 3. 12/19 renal consult recommends trx to ICU for HRS treatment  LINES / TUBES: 1. PIV  CULTURES: 1. 12/18 blood cultures, 2/2 positive for GPCs in clusters  ANTIBIOTICS: 1. Ceftriaxone 2. Ampicillin 3. Vancomycin 4. 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  The patient is unable to give a history due to her mental status.  The patient had, according to records, been experiencing some confusion and intermittently decreased alertness for a few months.  This was associated with behavior like talking to people who were not in the room and taking off her oxygen with agitation.  She also had decreased PO intake and constipation, with unclear adherence to lactulose. In addition, she had become more bed-bound since sustaining a knee injury.  She had also been noted to have some nose bleeds.   Per her daughter at admission, she had not had any recent ingestions.  Per records, she had been evaluated by a hepatologist with suggestions for palliative care eval, but it is unclear if this ever happened.  On admission, she was found to be in  renal failure with hyperkalemia and elevated INR in addition to her altered mental status.  Shortly after admission, her blood cultures turned positive for GPCs.  She has continued to make very little urine and her creatinine has not improved.  There was some concern for meningitis, but LP was unsuccessful by primary team and by IR.  She has been on broad spectrum antibiotics without any improvement in her mentation.  Nephrology consultants were worried about HRS and suggested a trial of norepinephrine/octreotide via central line.  The patient's daughter, Rodell Perna, makes her medical decisions as her POA.  PAST MEDICAL HISTORY :  Past Medical History  Diagnosis Date  . Asthma   . Hypertension   . Angina   . Osteoarthritis of both knees   . Borderline diabetes   . Headache(784.0) 12/02/11    "sometimes when I wake up in am; real bad"  . Arthritis   . Chronic lower back pain   . Tachycardia   . Cirrhosis     [name] 07/18/2013  . High cholesterol   . Exertional dyspnea 12/01/11  . Shortness of breath     "all the time right now" (07/18/2013)  . On home oxygen therapy     "2L; 24/7" (07/18/2013)  . Type II diabetes mellitus     "not taking RX anymore" (07/18/2013)  . Anxiety   . Depression   . Acute kidney failure 07/18/2013  . Altered mental status     admitting dx 07/18/2013    Past Surgical History  Procedure Laterality Date  .  Joint replacement    . Total knee arthroplasty Left 03/02/2011  . Tonsillectomy      "I was a little girl"  . Tubal ligation  1982  . Knee arthroscopy Left 2012    Prior to Admission medications   Medication Sig Start Date End Date Taking? Authorizing Provider  albuterol (PROAIR HFA) 108 (90 BASE) MCG/ACT inhaler Inhale 2 puffs into the lungs every 6 (six) hours as needed for wheezing or shortness of breath. 06/26/13  Yes Amber Nydia Bouton, MD  calcium carbonate (OS-CAL - DOSED IN MG OF ELEMENTAL CALCIUM) 1250 MG tablet Take 1 tablet by mouth daily.   Yes  Historical Provider, MD  cetirizine (ZYRTEC) 10 MG tablet Take 10 mg by mouth daily.   Yes Historical Provider, MD  cyclobenzaprine (FLEXERIL) 10 MG tablet Take 1 tablet (10 mg total) by mouth 2 (two) times daily as needed for muscle spasms. 07/16/13  Yes Kaitlyn Szekalski, PA-C  fluticasone (FLONASE) 50 MCG/ACT nasal spray Place 2 sprays into the nose daily.   Yes Historical Provider, MD  furosemide (LASIX) 20 MG tablet Take 0.5 tablets (10 mg total) by mouth daily. 06/26/13  Yes Amber Nydia Bouton, MD  lactulose (CHRONULAC) 10 GM/15ML solution Take 30 mLs (20 g total) by mouth 2 (two) times daily. 06/26/13  Yes Amber Nydia Bouton, MD  Nutritional Supplements (NUTRITIONAL SHAKE) LIQD Take 1 each by mouth 3 (three) times daily. 06/26/13  Yes Amber Nydia Bouton, MD  Oxycodone HCl 10 MG TABS Take 1 tablet (10 mg total) by mouth 3 (three) times daily as needed. 06/26/13  Yes Amber Nydia Bouton, MD  oxyCODONE-acetaminophen (PERCOCET/ROXICET) 5-325 MG per tablet Take 2 tablets by mouth every 4 (four) hours as needed for severe pain. 07/16/13  Yes Kaitlyn Szekalski, PA-C  pentoxifylline (TRENTAL) 400 MG CR tablet Take 400 mg by mouth 3 (three) times daily with meals.   Yes Historical Provider, MD  polyethylene glycol (MIRALAX / GLYCOLAX) packet Take 17 g by mouth daily. 05/21/13  Yes Leona Singleton, MD  potassium chloride SA (K-DUR,KLOR-CON) 20 MEQ tablet Take 1 tablet (20 mEq total) by mouth daily. 06/26/13  Yes Amber Nydia Bouton, MD  propranolol (INDERAL) 20 MG tablet Take 1 tablet (20 mg total) by mouth 3 (three) times daily. 06/26/13  Yes Amber Nydia Bouton, MD  senna (SENOKOT) 8.6 MG TABS tablet Take 1 tablet (8.6 mg total) by mouth 2 (two) times daily. 05/21/13  Yes Leona Singleton, MD  spironolactone (ALDACTONE) 100 MG tablet Take 1 tablet (100 mg total) by mouth daily. 06/26/13  Yes Amber Nydia Bouton, MD  venlafaxine (EFFEXOR) 37.5 MG tablet Take 1 tablet (37.5 mg total) by mouth 2 (two) times daily.  06/26/13  Yes Amber Nydia Bouton, MD    No Known Allergies  FAMILY HISTORY:  Family History  Problem Relation Age of Onset  . Diabetes Mother   . Diabetes Father   . Diabetes Sister   . Diabetes Brother     SOCIAL HISTORY: Patient unable to provide social history.  Per chart, she lives at home and has a hx of ETOH abuse. Last drink is unknown.   REVIEW OF SYSTEMS:  Unable to perform due to patient's mental status.   PHYSICAL EXAM  VITAL SIGNS: Temp:  [97 F (36.1 C)-98.8 F (37.1 C)] 98.4 F (36.9 C) (12/20 0424) Pulse Rate:  [90-103] 101 (12/20 0430) Resp:  [15-25] 17 (12/20 0430) BP: (91-116)/(39-70) 115/50 mmHg (12/20 0430) SpO2:  [95 %-100 %] 97 % (  12/20 0430) Weight:  [305 lb 5.4 oz (138.5 kg)-307 lb 5.1 oz (139.4 kg)] 307 lb 5.1 oz (139.4 kg) (12/20 0400)  INTAKE / OUTPUT: Intake/Output     12/19 0701 - 12/20 0700   I.V. (mL/kg) 1545.8 (11.1)   IV Piggyback 1306.4   Total Intake(mL/kg) 2852.2 (20.5)   Urine (mL/kg/hr) 535 (0.2)   Total Output 535   Net +2317.2        PHYSICAL EXAMINATION: General:  Obese, appears stated age. Unable to cooperate with conversation or exam due to encephalopathy - falls asleep frequently Neuro:  Slurs speech, able to state name but not place/date/context. Cannot keep conversation. Moves all extremities and does wiggle toes and open eyes on command.  HEENT:  PERRL. Neck is supple but pt does resist some of the exam to nuchal rigidity difficult to assess. Trachea midline.  Lymph: no cervical, submandibular, supraclavicular lymphadenopathy Cardiovascular:  Mild tachycardia, no MRG. 2+ pitting edema of lower extremities  Lungs:  CTA anteriorly. Encephalopathic breathing pattern. No respiratory distress.  Abdomen:  Obese, soft, nontender. Hypoactive bowel sounds.  On bedisde ultrasound, there is no ascites. Skin:  No rashes, lesions, or breakdown noted.   LABS:  CBC Recent Labs     07/19/13  0245  07/19/13  1636  07/20/13  0400   WBC  12.2*  8.4  9.0  HGB  7.6*  7.6*  7.5*  HCT  21.1*  21.3*  20.5*  PLT  107*  120*  PENDING    Coag's Recent Labs     07/18/13  1201  INR  1.99*    BMET Recent Labs     07/19/13  0245  07/19/13  1636  07/19/13  2138  NA  134*  132*  133*  K  6.0*  6.4*  5.4*  CL  104  103  104  CO2  20  20  21   BUN  36*  39*  42*  CREATININE  5.13*  4.53*  4.34*  GLUCOSE  104*  96  91    Electrolytes Recent Labs     07/19/13  0245  07/19/13  1636  07/19/13  2138  CALCIUM  7.9*  7.4*  7.2*    Sepsis Markers No results found for this basename: LACTICACIDVEN, PROCALCITON, O2SATVEN,  in the last 72 hours  ABG Recent Labs     07/19/13  1520  PHART  7.380  PCO2ART  35.0  PO2ART  63.7*    Liver Enzymes Recent Labs     07/18/13  1201  07/19/13  0245  07/19/13  1636  AST  45*  43*  56*  ALT  15  13  14   ALKPHOS  157*  153*  122*  BILITOT  3.6*  3.0*  2.9*  ALBUMIN  1.6*  1.3*  1.2*    Cardiac Enzymes Recent Labs     07/19/13  1055  07/19/13  1636  07/19/13  2138  TROPONINI  <0.30  <0.30  <0.30    Glucose Recent Labs     07/19/13  0720  07/19/13  1122  07/19/13  2018  07/19/13  2342  07/20/13  0327  07/20/13  0423  GLUCAP  91  113*  77  114*  126*  119*    Imaging Dg Chest 1 View  07/18/2013   CLINICAL DATA:  Altered mental status  EXAM: CHEST - 1 VIEW  COMPARISON:  May 17, 2013  FINDINGS: There is no edema or consolidation.  Heart is mildly enlarged with normal pulmonary vascularity. No appreciable adenopathy. There is persistent mild elevation the right hemidiaphragm.  IMPRESSION: Cardiac enlargement, stable.  No edema or consolidation.   Electronically Signed   By: Bretta Bang M.D.   On: 07/18/2013 17:40   Dg Hip Complete Left  07/18/2013   CLINICAL DATA:  Left hip pain.  EXAM: LEFT HIP - COMPLETE 2+ VIEW  COMPARISON:  None.  FINDINGS: There is no visible fracture. No dislocation. Pelvic bones are intact as well. The patient was unable  to externally rotate the hip.  IMPRESSION: Normal exam.   Electronically Signed   By: Geanie Cooley M.D.   On: 07/18/2013 13:51   Ct Head Wo Contrast  07/18/2013   CLINICAL DATA:  Altered mental status  EXAM: CT HEAD WITHOUT CONTRAST  TECHNIQUE: Contiguous axial images were obtained from the base of the skull through the vertex without intravenous contrast. Study was obtained within 24 hr of patient's arrival at the emergency department.  COMPARISON:  None.  FINDINGS: The ventricles are normal in size and configuration. There is no mass, hemorrhage, extra-axial fluid collection, or midline shift. The gray-white compartments are normal. No acute infarct identified. The bony calvarium appears intact. The mastoid air cells are clear.  IMPRESSION: Study within normal limits.   Electronically Signed   By: Bretta Bang M.D.   On: 07/18/2013 17:26   Dg Fluoro Guide Lumbar Puncture  07/19/2013   CLINICAL DATA:  Altered mental status.  EXAM: DIAGNOSTIC LUMBAR PUNCTURE UNDER FLUOROSCOPIC GUIDANCE  FLUOROSCOPY TIME:  1 min, 27 seconds  PROCEDURE: Informed consent was obtained from the patient prior to the procedure, including potential complications of headache, allergy, and pain. With the patient prone, the lower back was prepped with Betadine. 1% Lidocaine was used for local anesthesia. Lumbar puncture was attempted initially at the L4-5 level using a gauge needle. Despite multiple attempts, this was unsuccessful. Therefore, attempts were made at L3-4, again with unsuccessful return of CSF. At this point, a cross-table lateral view of the lumbar spine was obtained which demonstrates the medial at the appropriate depth of the spinal canal. Given no return of CSF, a 3rd attempt was made at the L2-3 level, also unsuccessful.  IMPRESSION: Multiple attempts that lumbar puncture with fluoroscopic guidance for unsuccessful less described above. This was discussed with Dr. Clinton Sawyer at the time of the procedure.    Electronically Signed   By: Charlett Nose M.D.   On: 07/19/2013 19:20   CXR: 12/18: clear lung fields. Question mild cardiomegaly  ASSESSMENT / PLAN: Active Problems:   Altered mental state   Sepsis   Acute renal failure  1. Neuro: Encephalopathy, likely mixed etiology due to metabolic derangements, sepsis, delirium due to medications.  No evidence of seizures. CT scan is negative for bleed or large CVA. While meningitis should be on differential, it's less likely.  -- Continue lactulose. May need NG tube to accomplish administration -- Hold narcotics, bezos, muscle relaxants, antihistamines, etc. -- concern that these medications have built up due to renal insufficiency while patient was taking them at home in large doses -- D/c dexamethasone (presumably started for meningitis, not home med) as steroids can worsen delirium   2. Renal: Hyperkalemia, AKI, possibleType I HRS. However, pt does not have ascites and ATN is also on differential -- Change D5 1/2 normal to albumin, scheduled 25g q6hrs x48hrs -- Consider central line and norepinephrine + octreotide.  Patient's daughter asks [place] to wait for her  arrival to consider central line placement, etc. -- Attempted to place NG for administering midodrine + octreotide in the meantime - unsuccessful -- Renal consult following - no HD  -- K improved s/p kayexalate - continue to monitor chem panel BID -- Consider renal u/s to look for hydronephrosis (given habitus this has been a difficult study - maybe CT?)  3. ID: Sepsis, GPCs in clusters in 2/2 blood cultures.  Source unclear.  U/A not convincing. Ultrasound at bedside did not reveal large volume ascites. CXR without obvious consolidation.  No large wounds. Meningitis treatment initiated.   -- Urine culture pending -- repeat CXR -- Continue Vancomycin, renally dosed.  -- D/c ampicillin and acyclovir - given positive blood cultures with GPCs, this is unlikely to be HSV or listeria -- Consider CT  chest, abdomen, pelvis to look for occult infection (CT did show left lower lobe consolidation in October and one view CXR less sensitive, particularly given her body habitus)  4. GI: cirrhosis and liver failure with encephalopathy, coagulopathy, thrombocytopenia. MELD=31 -- Continue lactulose - Change to PR if mental status does not improve.   -- D/c propranolol and diuretics for now, given desire to raise MAP for possible HRS -- Monitor coags, plts, bili -- Family discussion re: poor prognosis -- NPO for now  5. Heme: anemia, thrombocytopenia - related to liver disease. No active bleeding. -- monitor, transfuse PRN  6. Endocrine:  -- glucose checks q4 (patient NPO, liver dysfunction)  7. Pulmonary: history of asthma, acute on chronic hypoxia (with home oxygen for unclear reason). Also at risk for developing pulmonary edema, aspiration, DVTs, atelectasis, hypoventilation.  -- supplemental oxygen PRN  -- LE dopplers ordered to r/o DVT -- albuterol PRN  Dispo: change to ICU status, transfer to MICU  I have personally obtained a history, examined the patient, evaluated laboratory and imaging results, formulated the assessment and plan and placed orders.  CRITICAL CARE: The patient is critically ill with multiple organ systems failure and requires high complexity decision making for assessment and support, frequent evaluation and titration of therapies, application of advanced monitoring technologies and extensive interpretation of multiple databases. Critical Care Time devoted to patient care services described in this note is 60 minutes.   Pershing Cox, MD Pulmonary and Critical Care Medicine [facility] Pager: [phone]  07/20/2013, 4:43 AM

## 2013-07-20 NOTE — Consult Note (Addendum)
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 Will make decisions regarding her w/u based on these ongoing discussions. I spoke at length to her family regarding her ID issues, HD status (not planned) and planned discussions regarding care.  Her lab tests do not indicate that she has Hepatitis C  Thank you so much for this consult,   [name] (pager) [phone] [email or web address]  Colleen Olson is an 52 y.o. female.  HPI: 52 yo F with hx of hep C/cirrhosis and ETOH use, adm on 12-18 with 4 day hx of mental status change. There was a question that she had been off her lactulose and had not had BM during this period. She was found to have ARF (Cr 4.98, K+ 6.8), treated as if she had hepatorenal syndrome.  In hospital, she has had worsening MS and renal failure in hospital, requiring transfer to ICU 12-19. Her BCx were+ for GPC, she was started on vanco/amp/ceftriaxone/ACV. Her neck was noted to be fixed in a flexed position at this time as well, LP was attempted 12-19 without success. She was transferred to ICU on 12-20 for pressor support.   Past Medical History  Diagnosis Date  . Asthma   . Hypertension   . Angina   . Osteoarthritis of both knees   . Borderline diabetes   . Headache(784.0) 12/02/11    "sometimes when I wake up in am; real bad"  . Arthritis   . Chronic lower back pain   . Tachycardia   . Cirrhosis     [name] 07/18/2013  . High cholesterol   . Exertional dyspnea 12/01/11  . Shortness of breath    "all the time right now" (07/18/2013)  . On home oxygen therapy     "2L; 24/7" (07/18/2013)  . Type II diabetes mellitus     "not taking RX anymore" (07/18/2013)  . Anxiety   . Depression   . Acute kidney failure 07/18/2013  . Altered mental status     admitting dx 07/18/2013    Past Surgical History  Procedure Laterality Date  . Joint replacement    . Total knee arthroplasty Left 03/02/2011  . Tonsillectomy      "I was a little girl"  . Tubal ligation  1982  . Knee arthroscopy Left 2012     No Known Allergies  Medications:  Scheduled: . albumin human  25 g Intravenous Q6H  . ampicillin (OMNIPEN) IV  2 g Intravenous Q8H  . calcium carbonate  1 tablet Oral Daily  . cefTRIAXone (ROCEPHIN)  IV  2 g Intravenous Q12H  . Chlorhexidine Gluconate Cloth  6 each Topical Q0600  . fluticasone  2 spray Each Nare Daily  . lactulose  20 g Oral TID  . mupirocin ointment  1 application Nasal BID  . sodium chloride  3 mL Intravenous Q12H  . sodium polystyrene  30 g Per Tube Once  . [START ON 07/21/2013] vancomycin  1,750 mg Intravenous Q48H    Total days of antibiotics: 3  Ceftriaxone (12/19 -->) vanco (12/19 -->) ACV (12/19 ---12/20) Amp (  12/19 ---12/20)          Social History:  reports that she has never smoked. She has never used smokeless tobacco. She reports that she drinks alcohol. She reports that she does not use illicit drugs.  Family History  Problem Relation Age of Onset  . Diabetes Mother   . Diabetes Father   . Diabetes Sister   . Diabetes Brother     General ROS: per nsg- FOBT+, no BM in ICU, urine in foley (dark).   Blood pressure 118/58, pulse 99, temperature 98 F (36.7 C), temperature source Oral, resp. rate 15, height 5\' 8"  (1.727 m), weight 139.4 kg (307 lb 5.1 oz), SpO2 98.00%. General appearance: alert, mild distress and she only vocalizes to say "ow" Eyes: negative findings: pupils equal, round, reactive to light and accomodation Throat: abnormal  findings: dry Neck: no adenopathy and she resists lateral rotation Lungs: clear to auscultation bilaterally Heart: regular rate and rhythm Abdomen: abnormal findings:  distended and hypoactive bowel sounds Extremities: edema anasarca and tenderness with palpation of all extremities.    Results for orders placed during the hospital encounter of 07/18/13 (from the past 48 hour(s))  GLUCOSE, CAPILLARY     Status: Abnormal   Collection Time    07/18/13 11:19 AM      Result Value Range   Glucose-Capillary 62 (*) 70 - 99 mg/dL   Comment 1 Notify RN     Comment 2 Documented in Chart    CBC     Status: Abnormal   Collection Time    07/18/13 12:01 PM      Result Value Range   WBC 13.7 (*) 4.0 - 10.5 K/uL   RBC 2.96 (*) 3.87 - 5.11 MIL/uL   Hemoglobin 8.1 (*) 12.0 - 15.0 g/dL   HCT 11.9 (*) 14.7 - 82.9 %   MCV 77.0 (*) 78.0 - 100.0 fL   MCH 27.4  26.0 - 34.0 pg   MCHC 35.5  30.0 - 36.0 g/dL   RDW 56.2 (*) 13.0 - 86.5 %   Platelets 121 (*) 150 - 400 K/uL  COMPREHENSIVE METABOLIC PANEL     Status: Abnormal   Collection Time    07/18/13 12:01 PM      Result Value Range   Sodium 131 (*) 135 - 145 mEq/L   Potassium 6.8 (*) 3.5 - 5.1 mEq/L   Comment: NO VISIBLE HEMOLYSIS     CRITICAL RESULT CALLED TO, READ BACK BY AND VERIFIED WITH:     S SNIDER,RN AT 1307 07/18/13 BY K BARR   Chloride 102  96 - 112 mEq/L   CO2 21  19 - 32 mEq/L   Glucose, Bld 107 (*) 70 - 99 mg/dL   BUN 31 (*) 6 - 23 mg/dL   Creatinine, Ser 7.84 (*) 0.50 - 1.10 mg/dL   Calcium 8.3 (*) 8.4 - 10.5 mg/dL   Total Protein 8.1  6.0 - 8.3 g/dL   Albumin 1.6 (*) 3.5 - 5.2 g/dL   AST 45 (*) 0 - 37 U/L   ALT 15  0 - 35 U/L   Alkaline Phosphatase 157 (*) 39 - 117 U/L   Total Bilirubin 3.6 (*) 0.3 - 1.2 mg/dL   GFR calc non Af Amer 9 (*) >90 mL/min   GFR calc Af Amer 10 (*) >90 mL/min   Comment: (NOTE)     The eGFR has been calculated using the CKD EPI equation.     This calculation has not been  validated in all clinical  situations.     eGFR's persistently <90 mL/min signify possible Chronic Kidney     Disease.  PROTIME-INR     Status: Abnormal   Collection Time    07/18/13 12:01 PM      Result Value Range   Prothrombin Time 22.0 (*) 11.6 - 15.2 seconds   INR 1.99 (*) 0.00 - 1.49  AMMONIA     Status: None   Collection Time    07/18/13 12:01 PM      Result Value Range   Ammonia 52  11 - 60 umol/L  GLUCOSE, CAPILLARY     Status: None   Collection Time    07/18/13 12:46 PM      Result Value Range   Glucose-Capillary 80  70 - 99 mg/dL   Comment 1 Notify RN     Comment 2 Documented in Chart    URINALYSIS, ROUTINE W REFLEX MICROSCOPIC     Status: Abnormal   Collection Time    07/18/13  3:09 PM      Result Value Range   Color, Urine AMBER (*) YELLOW   Comment: BIOCHEMICALS MAY BE AFFECTED BY COLOR   APPearance CLOUDY (*) CLEAR   Specific Gravity, Urine 1.026  1.005 - 1.030   pH 5.0  5.0 - 8.0   Glucose, UA NEGATIVE  NEGATIVE mg/dL   Hgb urine dipstick NEGATIVE  NEGATIVE   Bilirubin Urine LARGE (*) NEGATIVE   Ketones, ur 15 (*) NEGATIVE mg/dL   Protein, ur NEGATIVE  NEGATIVE mg/dL   Urobilinogen, UA 1.0  0.0 - 1.0 mg/dL   Nitrite POSITIVE (*) NEGATIVE   Leukocytes, UA SMALL (*) NEGATIVE  URINE RAPID DRUG SCREEN (HOSP PERFORMED)     Status: Abnormal   Collection Time    07/18/13  3:09 PM      Result Value Range   Opiates POSITIVE (*) NONE DETECTED   Cocaine NONE DETECTED  NONE DETECTED   Benzodiazepines NONE DETECTED  NONE DETECTED   Amphetamines NONE DETECTED  NONE DETECTED   Tetrahydrocannabinol NONE DETECTED  NONE DETECTED   Barbiturates NONE DETECTED  NONE DETECTED   Comment:            DRUG SCREEN FOR MEDICAL PURPOSES     ONLY.  IF CONFIRMATION IS NEEDED     FOR ANY PURPOSE, NOTIFY LAB     WITHIN 5 DAYS.                LOWEST DETECTABLE LIMITS     FOR URINE DRUG SCREEN     Drug Class       Cutoff (ng/mL)     Amphetamine      1000     Barbiturate      200     Benzodiazepine   200      Tricyclics       300     Opiates          300     Cocaine          300     THC              50  URINE MICROSCOPIC-ADD ON     Status: Abnormal   Collection Time    07/18/13  3:09 PM      Result Value Range   Squamous Epithelial / LPF MANY (*) RARE   WBC, UA 3-6  <3 WBC/hpf   RBC / HPF 0-2  <3 RBC/hpf  Bacteria, UA RARE  RARE   Casts HYALINE CASTS (*) NEGATIVE  BASIC METABOLIC PANEL     Status: Abnormal   Collection Time    07/18/13  3:19 PM      Result Value Range   Sodium 134 (*) 135 - 145 mEq/L   Potassium 5.8 (*) 3.5 - 5.1 mEq/L   Chloride 102  96 - 112 mEq/L   CO2 23  19 - 32 mEq/L   Glucose, Bld 77  70 - 99 mg/dL   BUN 31 (*) 6 - 23 mg/dL   Creatinine, Ser 1.61 (*) 0.50 - 1.10 mg/dL   Calcium 8.3 (*) 8.4 - 10.5 mg/dL   GFR calc non Af Amer 9 (*) >90 mL/min   GFR calc Af Amer 11 (*) >90 mL/min   Comment: (NOTE)     The eGFR has been calculated using the CKD EPI equation.     This calculation has not been validated in all clinical situations.     eGFR's persistently <90 mL/min signify possible Chronic Kidney     Disease.  TROPONIN I     Status: None   Collection Time    07/18/13  4:03 PM      Result Value Range   Troponin I <0.30  <0.30 ng/mL   Comment:            Due to the release kinetics of cTnI,     a negative result within the first hours     of the onset of symptoms does not rule out     myocardial infarction with certainty.     If myocardial infarction is still suspected,     repeat the test at appropriate intervals.  SALICYLATE LEVEL     Status: Abnormal   Collection Time    07/18/13  4:03 PM      Result Value Range   Salicylate Lvl <2.0 (*) 2.8 - 20.0 mg/dL  ACETAMINOPHEN LEVEL     Status: None   Collection Time    07/18/13  4:03 PM      Result Value Range   Acetaminophen (Tylenol), Serum <15.0  10 - 30 ug/mL   Comment:            THERAPEUTIC CONCENTRATIONS VARY     SIGNIFICANTLY. A RANGE OF 10-30     ug/mL MAY BE AN EFFECTIVE      CONCENTRATION FOR MANY PATIENTS.     HOWEVER, SOME ARE BEST TREATED     AT CONCENTRATIONS OUTSIDE THIS     RANGE.     ACETAMINOPHEN CONCENTRATIONS     >150 ug/mL AT 4 HOURS AFTER     INGESTION AND >50 ug/mL AT 12     HOURS AFTER INGESTION ARE     OFTEN ASSOCIATED WITH TOXIC     REACTIONS.  CULTURE, BLOOD (ROUTINE X 2)     Status: None   Collection Time    07/18/13  4:55 PM      Result Value Range   Specimen Description BLOOD ARM RIGHT     Special Requests BOTTLES DRAWN AEROBIC AND ANAEROBIC 10CC     Culture  Setup Time       Value: 07/19/2013 02:04     Performed at [facility]   Culture       Value: STAPHYLOCOCCUS AUREUS     Note: RIFAMPIN AND GENTAMICIN SHOULD NOT BE USED AS SINGLE DRUGS FOR TREATMENT OF STAPH INFECTIONS.     Note: Gram Stain Report  Called to,Read Back By and Verified With: Dianna Limbo 07/19/13 1030 BY SMITHERSJ     Performed at [facility]   Report Status PENDING    CULTURE, BLOOD (ROUTINE X 2)     Status: None   Collection Time    07/18/13  5:05 PM      Result Value Range   Specimen Description BLOOD HAND LEFT     Special Requests BOTTLES DRAWN AEROBIC ONLY 10CC     Culture  Setup Time       Value: 07/19/2013 02:03     Performed at [facility]   Culture       Value: STAPHYLOCOCCUS AUREUS     Note: Gram Stain Report Called to,Read Back By and Verified With: JANET BEASLEY 07/19/13 1035 BY SMITHERSJ     Performed at [facility]   Report Status PENDING    ETHANOL     Status: None   Collection Time    07/18/13  6:08 PM      Result Value Range   Alcohol, Ethyl (B) <11  0 - 11 mg/dL   Comment:            LOWEST DETECTABLE LIMIT FOR     SERUM ALCOHOL IS 11 mg/dL     FOR MEDICAL PURPOSES ONLY  GLUCOSE, CAPILLARY     Status: Abnormal   Collection Time    07/18/13  6:36 PM      Result Value Range   Glucose-Capillary 53 (*) 70 - 99 mg/dL  GLUCOSE, CAPILLARY     Status: Abnormal   Collection Time    07/18/13  7:34 PM        Result Value Range   Glucose-Capillary 69 (*) 70 - 99 mg/dL  GLUCOSE, CAPILLARY     Status: Abnormal   Collection Time    07/18/13  8:49 PM      Result Value Range   Glucose-Capillary 106 (*) 70 - 99 mg/dL  TROPONIN I     Status: None   Collection Time    07/18/13  9:00 PM      Result Value Range   Troponin I <0.30  <0.30 ng/mL   Comment:            Due to the release kinetics of cTnI,     a negative result within the first hours     of the onset of symptoms does not rule out     myocardial infarction with certainty.     If myocardial infarction is still suspected,     repeat the test at appropriate intervals.  GLUCOSE, CAPILLARY     Status: None   Collection Time    07/19/13 12:37 AM      Result Value Range   Glucose-Capillary 73  70 - 99 mg/dL  GLUCOSE, CAPILLARY     Status: None   Collection Time    07/19/13  1:36 AM      Result Value Range   Glucose-Capillary 77  70 - 99 mg/dL  GLUCOSE, CAPILLARY     Status: None   Collection Time    07/19/13  2:42 AM      Result Value Range   Glucose-Capillary 88  70 - 99 mg/dL  TROPONIN I     Status: None   Collection Time    07/19/13  2:45 AM      Result Value Range   Troponin I <0.30  <0.30 ng/mL   Comment:  Due to the release kinetics of cTnI,     a negative result within the first hours     of the onset of symptoms does not rule out     myocardial infarction with certainty.     If myocardial infarction is still suspected,     repeat the test at appropriate intervals.  COMPREHENSIVE METABOLIC PANEL     Status: Abnormal   Collection Time    07/19/13  2:45 AM      Result Value Range   Sodium 134 (*) 135 - 145 mEq/L   Potassium 6.0 (*) 3.5 - 5.1 mEq/L   Chloride 104  96 - 112 mEq/L   CO2 20  19 - 32 mEq/L   Glucose, Bld 104 (*) 70 - 99 mg/dL   BUN 36 (*) 6 - 23 mg/dL   Creatinine, Ser 1.61 (*) 0.50 - 1.10 mg/dL   Calcium 7.9 (*) 8.4 - 10.5 mg/dL   Total Protein 7.4  6.0 - 8.3 g/dL   Albumin 1.3 (*) 3.5  - 5.2 g/dL   AST 43 (*) 0 - 37 U/L   ALT 13  0 - 35 U/L   Alkaline Phosphatase 153 (*) 39 - 117 U/L   Total Bilirubin 3.0 (*) 0.3 - 1.2 mg/dL   GFR calc non Af Amer 9 (*) >90 mL/min   GFR calc Af Amer 10 (*) >90 mL/min   Comment: (NOTE)     The eGFR has been calculated using the CKD EPI equation.     This calculation has not been validated in all clinical situations.     eGFR's persistently <90 mL/min signify possible Chronic Kidney     Disease.  CBC     Status: Abnormal   Collection Time    07/19/13  2:45 AM      Result Value Range   WBC 12.2 (*) 4.0 - 10.5 K/uL   RBC 2.79 (*) 3.87 - 5.11 MIL/uL   Hemoglobin 7.6 (*) 12.0 - 15.0 g/dL   HCT 09.6 (*) 04.5 - 40.9 %   MCV 75.6 (*) 78.0 - 100.0 fL   MCH 27.2  26.0 - 34.0 pg   MCHC 36.0  30.0 - 36.0 g/dL   RDW 81.1 (*) 91.4 - 78.2 %   Platelets 107 (*) 150 - 400 K/uL   Comment: CONSISTENT WITH PREVIOUS RESULT  GLUCOSE, CAPILLARY     Status: None   Collection Time    07/19/13  4:38 AM      Result Value Range   Glucose-Capillary 98  70 - 99 mg/dL  GLUCOSE, CAPILLARY     Status: None   Collection Time    07/19/13  7:20 AM      Result Value Range   Glucose-Capillary 91  70 - 99 mg/dL  TROPONIN I     Status: None   Collection Time    07/19/13 10:55 AM      Result Value Range   Troponin I <0.30  <0.30 ng/mL   Comment:            Due to the release kinetics of cTnI,     a negative result within the first hours     of the onset of symptoms does not rule out     myocardial infarction with certainty.     If myocardial infarction is still suspected,     repeat the test at appropriate intervals.  GLUCOSE, CAPILLARY     Status: Abnormal   Collection Time  07/19/13 11:22 AM      Result Value Range   Glucose-Capillary 113 (*) 70 - 99 mg/dL   Comment 1 Notify RN     Comment 2 Documented in Chart    MRSA PCR SCREENING     Status: Abnormal   Collection Time    07/19/13  2:37 PM      Result Value Range   MRSA by PCR POSITIVE (*)  NEGATIVE   Comment:            The GeneXpert MRSA Assay (FDA     approved for NASAL specimens     only), is one component of a     comprehensive MRSA colonization     surveillance program. It is not     intended to diagnose MRSA     infection nor to guide or     monitor treatment for     MRSA infections.     RESULT CALLED TO, READ BACK BY AND VERIFIED WITH:     C. SCHILLER RN 15:50 07/19/13 ([email or web address])  BLOOD GAS, ARTERIAL     Status: Abnormal   Collection Time    07/19/13  3:20 PM      Result Value Range   O2 Content 1.0     Delivery systems NASAL CANNULA     pH, Arterial 7.380  7.350 - 7.450   pCO2 arterial 35.0  35.0 - 45.0 mmHg   pO2, Arterial 63.7 (*) 80.0 - 100.0 mmHg   Bicarbonate 20.2  20.0 - 24.0 mEq/L   TCO2 21.3  0 - 100 mmol/L   Acid-base deficit 4.0 (*) 0.0 - 2.0 mmol/L   O2 Saturation 90.1     Patient temperature 98.6     Collection site LEFT RADIAL     Drawn by [phone]     Sample type ARTERIAL DRAW     Allens test (pass/fail) PASS  PASS  SODIUM, URINE, RANDOM     Status: None   Collection Time    07/19/13  4:15 PM      Result Value Range   Sodium, Ur 24    CREATININE, URINE, RANDOM     Status: None   Collection Time    07/19/13  4:15 PM      Result Value Range   Creatinine, Urine 287.12    URINALYSIS, ROUTINE W REFLEX MICROSCOPIC     Status: Abnormal   Collection Time    07/19/13  4:15 PM      Result Value Range   Color, Urine AMBER (*) YELLOW   Comment: BIOCHEMICALS MAY BE AFFECTED BY COLOR   APPearance CLEAR  CLEAR   Specific Gravity, Urine 1.024  1.005 - 1.030   pH 5.0  5.0 - 8.0   Glucose, UA NEGATIVE  NEGATIVE mg/dL   Hgb urine dipstick NEGATIVE  NEGATIVE   Bilirubin Urine LARGE (*) NEGATIVE   Ketones, ur 15 (*) NEGATIVE mg/dL   Protein, ur NEGATIVE  NEGATIVE mg/dL   Urobilinogen, UA 1.0  0.0 - 1.0 mg/dL   Nitrite POSITIVE (*) NEGATIVE   Leukocytes, UA SMALL (*) NEGATIVE  URINE MICROSCOPIC-ADD ON     Status: Abnormal   Collection Time     07/19/13  4:15 PM      Result Value Range   Squamous Epithelial / LPF MANY (*) RARE   WBC, UA 0-2  <3 WBC/hpf   RBC / HPF 0-2  <3 RBC/hpf   Bacteria, UA MANY (*) RARE  TROPONIN I  Status: None   Collection Time    07/19/13  4:36 PM      Result Value Range   Troponin I <0.30  <0.30 ng/mL   Comment:            Due to the release kinetics of cTnI,     a negative result within the first hours     of the onset of symptoms does not rule out     myocardial infarction with certainty.     If myocardial infarction is still suspected,     repeat the test at appropriate intervals.  COMPREHENSIVE METABOLIC PANEL     Status: Abnormal   Collection Time    07/19/13  4:36 PM      Result Value Range   Sodium 132 (*) 135 - 145 mEq/L   Potassium 6.4 (*) 3.5 - 5.1 mEq/L   Comment: HEMOLYSIS AT THIS LEVEL MAY AFFECT RESULT     CRITICAL RESULT CALLED TO, READ BACK BY AND VERIFIED WITH:     J LINDSAY,RN 1745 07/19/13 D BRADLEY   Chloride 103  96 - 112 mEq/L   CO2 20  19 - 32 mEq/L   Glucose, Bld 96  70 - 99 mg/dL   BUN 39 (*) 6 - 23 mg/dL   Creatinine, Ser 1.61 (*) 0.50 - 1.10 mg/dL   Calcium 7.4 (*) 8.4 - 10.5 mg/dL   Total Protein 7.2  6.0 - 8.3 g/dL   Albumin 1.2 (*) 3.5 - 5.2 g/dL   AST 56 (*) 0 - 37 U/L   Comment: HEMOLYSIS AT THIS LEVEL MAY AFFECT RESULT   ALT 14  0 - 35 U/L   Comment: HEMOLYSIS AT THIS LEVEL MAY AFFECT RESULT   Alkaline Phosphatase 122 (*) 39 - 117 U/L   Total Bilirubin 2.9 (*) 0.3 - 1.2 mg/dL   GFR calc non Af Amer 10 (*) >90 mL/min   GFR calc Af Amer 12 (*) >90 mL/min   Comment: (NOTE)     The eGFR has been calculated using the CKD EPI equation.     This calculation has not been validated in all clinical situations.     eGFR's persistently <90 mL/min signify possible Chronic Kidney     Disease.  CBC     Status: Abnormal   Collection Time    07/19/13  4:36 PM      Result Value Range   WBC 8.4  4.0 - 10.5 K/uL   RBC 2.83 (*) 3.87 - 5.11 MIL/uL   Hemoglobin 7.6  (*) 12.0 - 15.0 g/dL   HCT 09.6 (*) 04.5 - 40.9 %   MCV 75.3 (*) 78.0 - 100.0 fL   MCH 26.9  26.0 - 34.0 pg   MCHC 35.7  30.0 - 36.0 g/dL   RDW 81.1 (*) 91.4 - 78.2 %   Platelets 120 (*) 150 - 400 K/uL  AMMONIA     Status: Abnormal   Collection Time    07/19/13  4:36 PM      Result Value Range   Ammonia 91 (*) 11 - 60 umol/L  LACTIC ACID, PLASMA     Status: Abnormal   Collection Time    07/19/13  4:36 PM      Result Value Range   Lactic Acid, Venous 2.6 (*) 0.5 - 2.2 mmol/L  GLUCOSE, CAPILLARY     Status: None   Collection Time    07/19/13  8:18 PM      Result Value Range  Glucose-Capillary 77  70 - 99 mg/dL  TROPONIN I     Status: None   Collection Time    07/19/13  9:38 PM      Result Value Range   Troponin I <0.30  <0.30 ng/mL   Comment:            Due to the release kinetics of cTnI,     a negative result within the first hours     of the onset of symptoms does not rule out     myocardial infarction with certainty.     If myocardial infarction is still suspected,     repeat the test at appropriate intervals.  BASIC METABOLIC PANEL     Status: Abnormal   Collection Time    07/19/13  9:38 PM      Result Value Range   Sodium 133 (*) 135 - 145 mEq/L   Potassium 5.4 (*) 3.5 - 5.1 mEq/L   Chloride 104  96 - 112 mEq/L   CO2 21  19 - 32 mEq/L   Glucose, Bld 91  70 - 99 mg/dL   BUN 42 (*) 6 - 23 mg/dL   Creatinine, Ser 1.61 (*) 0.50 - 1.10 mg/dL   Calcium 7.2 (*) 8.4 - 10.5 mg/dL   GFR calc non Af Amer 11 (*) >90 mL/min   GFR calc Af Amer 12 (*) >90 mL/min   Comment: (NOTE)     The eGFR has been calculated using the CKD EPI equation.     This calculation has not been validated in all clinical situations.     eGFR's persistently <90 mL/min signify possible Chronic Kidney     Disease.  OCCULT BLOOD X 1 CARD TO LAB, STOOL     Status: Abnormal   Collection Time    07/19/13 11:42 PM      Result Value Range   Fecal Occult Bld POSITIVE (*) NEGATIVE  GLUCOSE, CAPILLARY      Status: Abnormal   Collection Time    07/19/13 11:42 PM      Result Value Range   Glucose-Capillary 114 (*) 70 - 99 mg/dL   Comment 1 Documented in Chart     Comment 2 Notify RN    GLUCOSE, CAPILLARY     Status: Abnormal   Collection Time    07/20/13  3:27 AM      Result Value Range   Glucose-Capillary 126 (*) 70 - 99 mg/dL   Comment 1 Notify RN     Comment 2 Documented in Chart    TROPONIN I     Status: None   Collection Time    07/20/13  4:00 AM      Result Value Range   Troponin I <0.30  <0.30 ng/mL   Comment:            Due to the release kinetics of cTnI,     a negative result within the first hours     of the onset of symptoms does not rule out     myocardial infarction with certainty.     If myocardial infarction is still suspected,     repeat the test at appropriate intervals.  CBC     Status: Abnormal   Collection Time    07/20/13  4:00 AM      Result Value Range   WBC 9.0  4.0 - 10.5 K/uL   RBC 2.73 (*) 3.87 - 5.11 MIL/uL   Hemoglobin 7.5 (*) 12.0 - 15.0  g/dL   HCT 53.6 (*) 64.4 - 03.4 %   MCV 75.1 (*) 78.0 - 100.0 fL   MCH 27.5  26.0 - 34.0 pg   MCHC 36.6 (*) 30.0 - 36.0 g/dL   RDW 74.2 (*) 59.5 - 63.8 %   Platelets 86 (*) 150 - 400 K/uL   Comment: SPECIMEN CHECKED FOR CLOTS     REPEATED TO VERIFY     PLATELET COUNT CONFIRMED BY SMEAR     DELTA CHECK NOTED  RENAL FUNCTION PANEL     Status: Abnormal   Collection Time    07/20/13  4:00 AM      Result Value Range   Sodium 133 (*) 135 - 145 mEq/L   Potassium 5.5 (*) 3.5 - 5.1 mEq/L   Chloride 104  96 - 112 mEq/L   CO2 18 (*) 19 - 32 mEq/L   Glucose, Bld 126 (*) 70 - 99 mg/dL   BUN 41 (*) 6 - 23 mg/dL   Creatinine, Ser 7.56 (*) 0.50 - 1.10 mg/dL   Calcium 7.6 (*) 8.4 - 10.5 mg/dL   Phosphorus 6.0 (*) 2.3 - 4.6 mg/dL   Albumin 1.6 (*) 3.5 - 5.2 g/dL   GFR calc non Af Amer 12 (*) >90 mL/min   GFR calc Af Amer 14 (*) >90 mL/min   Comment: (NOTE)     The eGFR has been calculated using the CKD EPI  equation.     This calculation has not been validated in all clinical situations.     eGFR's persistently <90 mL/min signify possible Chronic Kidney     Disease.  BLOOD GAS, VENOUS     Status: Abnormal   Collection Time    07/20/13  4:05 AM      Result Value Range   FIO2 0.40     Delivery systems NASAL CANNULA     pH, Ven 7.410 (*) 7.250 - 7.300   pCO2, Ven 41.0 (*) 45.0 - 50.0 mmHg   pO2, Ven 45.4 (*) 30.0 - 45.0 mmHg   Bicarbonate 25.5 (*) 20.0 - 24.0 mEq/L   TCO2 26.7  0 - 100 mmol/L   Acid-Base Excess 1.3  0.0 - 2.0 mmol/L   O2 Saturation 76.4     Patient temperature 98.6     Drawn by COLLECTED BY NURSE     Sample type MIXED VENOUS SAMPLE    GLUCOSE, CAPILLARY     Status: Abnormal   Collection Time    07/20/13  4:23 AM      Result Value Range   Glucose-Capillary 119 (*) 70 - 99 mg/dL   Comment 1 Documented in Chart     Comment 2 Notify RN    GLUCOSE, CAPILLARY     Status: Abnormal   Collection Time    07/20/13  7:25 AM      Result Value Range   Glucose-Capillary 120 (*) 70 - 99 mg/dL      Component Value Date/Time   SDES BLOOD HAND LEFT 07/18/2013 1705   SPECREQUEST BOTTLES DRAWN AEROBIC ONLY 10CC 07/18/2013 1705   CULT  Value: STAPHYLOCOCCUS AUREUS Note: Gram Stain Report Called to,Read Back By and Verified With: JANET BEASLEY 07/19/13 1035 BY SMITHERSJ Performed at [facility] 07/18/2013 1705   REPTSTATUS PENDING 07/18/2013 1705   Dg Chest 1 View  07/18/2013   CLINICAL DATA:  Altered mental status  EXAM: CHEST - 1 VIEW  COMPARISON:  May 17, 2013  FINDINGS: There is no edema or consolidation. Heart  is mildly enlarged with normal pulmonary vascularity. No appreciable adenopathy. There is persistent mild elevation the right hemidiaphragm.  IMPRESSION: Cardiac enlargement, stable.  No edema or consolidation.   Electronically Signed   By: Bretta Bang M.D.   On: 07/18/2013 17:40   Dg Hip Complete Left  07/18/2013   CLINICAL DATA:  Left hip pain.  EXAM:  LEFT HIP - COMPLETE 2+ VIEW  COMPARISON:  None.  FINDINGS: There is no visible fracture. No dislocation. Pelvic bones are intact as well. The patient was unable to externally rotate the hip.  IMPRESSION: Normal exam.   Electronically Signed   By: Geanie Cooley M.D.   On: 07/18/2013 13:51   Ct Head Wo Contrast  07/18/2013   CLINICAL DATA:  Altered mental status  EXAM: CT HEAD WITHOUT CONTRAST  TECHNIQUE: Contiguous axial images were obtained from the base of the skull through the vertex without intravenous contrast. Study was obtained within 24 hr of patient's arrival at the emergency department.  COMPARISON:  None.  FINDINGS: The ventricles are normal in size and configuration. There is no mass, hemorrhage, extra-axial fluid collection, or midline shift. The gray-white compartments are normal. No acute infarct identified. The bony calvarium appears intact. The mastoid air cells are clear.  IMPRESSION: Study within normal limits.   Electronically Signed   By: Bretta Bang M.D.   On: 07/18/2013 17:26   Dg Chest Port 1 View  07/20/2013   CLINICAL DATA:  Sepsis, possible aspiration.  EXAM: PORTABLE CHEST - 1 VIEW  COMPARISON:  Chest x-ray dated July 18, 2013  FINDINGS: Since yesterday's study there has developed increased interstitial density within the left lung and in the right upper lobe. The cardiopericardial silhouette remains enlarged. The pulmonary vascularity is indistinct. There is no definite pleural effusion.  IMPRESSION: The findings are consistent with atelectasis or developing pneumonia diffusely on the left and in the right upper lobe. This may reflect the clinically suspected aspiration.   Electronically Signed   By: David  [place]   On: 07/20/2013 08:36   Dg Fluoro Guide Lumbar Puncture  07/19/2013   CLINICAL DATA:  Altered mental status.  EXAM: DIAGNOSTIC LUMBAR PUNCTURE UNDER FLUOROSCOPIC GUIDANCE  FLUOROSCOPY TIME:  1 min, 27 seconds  PROCEDURE: Informed consent was obtained from  the patient prior to the procedure, including potential complications of headache, allergy, and pain. With the patient prone, the lower back was prepped with Betadine. 1% Lidocaine was used for local anesthesia. Lumbar puncture was attempted initially at the L4-5 level using a gauge needle. Despite multiple attempts, this was unsuccessful. Therefore, attempts were made at L3-4, again with unsuccessful return of CSF. At this point, a cross-table lateral view of the lumbar spine was obtained which demonstrates the medial at the appropriate depth of the spinal canal. Given no return of CSF, a 3rd attempt was made at the L2-3 level, also unsuccessful.  IMPRESSION: Multiple attempts that lumbar puncture with fluoroscopic guidance for unsuccessful less described above. This was discussed with Dr. Clinton Sawyer at the time of the procedure.   Electronically Signed   By: Charlett Nose M.D.   On: 07/19/2013 19:20   Recent Results (from the past 240 hour(s))  CULTURE, BLOOD (ROUTINE X 2)     Status: None   Collection Time    07/18/13  4:55 PM      Result Value Range Status   Specimen Description BLOOD ARM RIGHT   Final   Special Requests BOTTLES DRAWN AEROBIC AND ANAEROBIC 10CC  Final   Culture  Setup Time     Final   Value: 07/19/2013 02:04     Performed at [facility]   Culture     Final   Value: STAPHYLOCOCCUS AUREUS     Note: RIFAMPIN AND GENTAMICIN SHOULD NOT BE USED AS SINGLE DRUGS FOR TREATMENT OF STAPH INFECTIONS.     Note: Gram Stain Report Called to,Read Back By and Verified With: ASHLEY LARSON 07/19/13 1030 BY SMITHERSJ     Performed at [facility]   Report Status PENDING   Incomplete  CULTURE, BLOOD (ROUTINE X 2)     Status: None   Collection Time    07/18/13  5:05 PM      Result Value Range Status   Specimen Description BLOOD HAND LEFT   Final   Special Requests BOTTLES DRAWN AEROBIC ONLY 10CC   Final   Culture  Setup Time     Final   Value: 07/19/2013 02:03     Performed  at [facility]   Culture     Final   Value: STAPHYLOCOCCUS AUREUS     Note: Gram Stain Report Called to,Read Back By and Verified With: JANET BEASLEY 07/19/13 1035 BY SMITHERSJ     Performed at [facility]   Report Status PENDING   Incomplete  MRSA PCR SCREENING     Status: Abnormal   Collection Time    07/19/13  2:37 PM      Result Value Range Status   MRSA by PCR POSITIVE (*) NEGATIVE Final   Comment:            The GeneXpert MRSA Assay (FDA     approved for NASAL specimens     only), is one component of a     comprehensive MRSA colonization     surveillance program. It is not     intended to diagnose MRSA     infection nor to guide or     monitor treatment for     MRSA infections.     RESULT CALLED TO, READ BACK BY AND VERIFIED WITH:     C. SCHILLER RN 15:50 07/19/13 ([email or web address])      07/20/2013, 10:50 AM     LOS: 2 days         [place] [facility] Staphylococcus aureus bacteremia   Staphylococcus aureus bacteremia (SAB) is associated with a high rate of complications and mortality.  Specific aspects of clinical management are critical to optimizing the outcome of patients with SAB.  Therefore, the [facility] Antimicrobial Management Team [facility]) has initiated an intervention aimed at improving the management of SAB at [facility].  To do so, Infectious Diseases physicians are providing an evidence-based consult for the management of all patients with SAB.     Yes No Comments  Perform follow-up blood cultures (even if the patient is afebrile) to ensure clearance of bacteremia [x]  []    Remove vascular catheter and obtain follow-up blood cultures after the removal of the catheter []  []    Perform echocardiography to evaluate for endocarditis (transthoracic ECHO is 40-50% sensitive, TEE is > 90% sensitive) []  [x]  Please keep in mind, that neither test can definitively EXCLUDE endocarditis, and that should clinical suspicion remain high  for endocarditis the patient should then still be treated with an "endocarditis" duration of therapy = 6 weeks  Consult electrophysiologist to evaluate implanted cardiac device (pacemaker, ICD) []  []    Ensure source control []  []  Have all  abscesses been drained effectively? Have deep seeded infections (septic joints or osteomyelitis) had appropriate surgical debridement?  Investigate for "metastatic" sites of infection []  []  Does the patient have ANY symptom or physical exam finding that would suggest a deeper infection (back or neck pain that may be suggestive of vertebral osteomyelitis or epidural abscess, muscle pain that could be a symptom of pyomyositis)?  Keep in mind that for deep seeded infections MRI imaging with contrast is preferred rather than other often insensitive tests such as plain x-rays, especially early in a patient's presentation.  Change antibiotic therapy to __________________ []  []  Beta-lactam antibiotics are preferred for MSSA due to higher cure rates.   If on Vancomycin, goal trough should be 15 - 20 mcg/mL  Estimated duration of IV antibiotic therapy:   []  []  Consult case management for probably prolonged outpatient IV antibiotic therapy

## 2013-07-21 DIAGNOSIS — R4182 Altered mental status, unspecified: Secondary | ICD-10-CM

## 2013-07-21 DIAGNOSIS — K7460 Unspecified cirrhosis of liver: Secondary | ICD-10-CM

## 2013-07-21 DIAGNOSIS — N390 Urinary tract infection, site not specified: Secondary | ICD-10-CM

## 2013-07-21 DIAGNOSIS — A498 Other bacterial infections of unspecified site: Secondary | ICD-10-CM

## 2013-07-21 DIAGNOSIS — A4902 Methicillin resistant Staphylococcus aureus infection, unspecified site: Secondary | ICD-10-CM

## 2013-07-21 DIAGNOSIS — N179 Acute kidney failure, unspecified: Secondary | ICD-10-CM

## 2013-07-21 LAB — CULTURE, BLOOD (ROUTINE X 2)

## 2013-07-21 LAB — CBC
HCT: 19.8 % — ABNORMAL LOW (ref 36.0–46.0)
Hemoglobin: 7.1 g/dL — ABNORMAL LOW (ref 12.0–15.0)
MCH: 26.7 pg (ref 26.0–34.0)
MCHC: 35.9 g/dL (ref 30.0–36.0)
MCV: 74.4 fL — ABNORMAL LOW (ref 78.0–100.0)
Platelets: 81 10*3/uL — ABNORMAL LOW (ref 150–400)
RDW: 17.2 % — ABNORMAL HIGH (ref 11.5–15.5)
WBC: 9.9 10*3/uL (ref 4.0–10.5)

## 2013-07-21 LAB — TROPONIN I: Troponin I: <0.3 ng/mL (ref <0.30)

## 2013-07-21 LAB — BASIC METABOLIC PANEL
BUN: 39 mg/dL — ABNORMAL HIGH (ref 6–23)
Calcium: 8.1 mg/dL — ABNORMAL LOW (ref 8.4–10.5)
Chloride: 105 mEq/L (ref 96–112)
Creatinine, Ser: 2.2 mg/dL — ABNORMAL HIGH (ref 0.50–1.10)
GFR calc non Af Amer: 24 mL/min — ABNORMAL LOW (ref >90)
Glucose, Bld: 116 mg/dL — ABNORMAL HIGH (ref 70–99)
Sodium: 140 mEq/L (ref 135–145)

## 2013-07-21 LAB — URINE CULTURE: Colony Count: 25000

## 2013-07-21 LAB — CLOSTRIDIUM DIFFICILE BY PCR: Toxigenic C. Difficile by PCR: NEGATIVE

## 2013-07-21 LAB — GLUCOSE, CAPILLARY
Glucose-Capillary: 112 mg/dL — ABNORMAL HIGH (ref 70–99)
Glucose-Capillary: 107 mg/dL — ABNORMAL HIGH (ref 70–99)
Glucose-Capillary: 111 mg/dL — ABNORMAL HIGH (ref 70–99)
Glucose-Capillary: 116 mg/dL — ABNORMAL HIGH (ref 70–99)

## 2013-07-21 LAB — PROTIME-INR: Prothrombin Time: 23.9 seconds — ABNORMAL HIGH (ref 11.6–15.2)

## 2013-07-21 MED ORDER — SODIUM CHLORIDE 0.9 % IV SOLN
1750.0000 mg | INTRAVENOUS | Status: DC
  Administered 2013-07-21 – 2013-07-23 (×3): 1750 mg via INTRAVENOUS
  Filled 2013-07-21 (×4): qty 1750

## 2013-07-21 NOTE — Progress Notes (Signed)
PULMONARY  / CRITICAL CARE MEDICINE CONSULTATION  Name: Colleen Olson MRN: [id number] DOB: [birth year]    ADMISSION DATE:  07/18/2013  REASON FOR CONSULT:Altered mental status, sepsis  BRIEF PATIENT DESCRIPTION: Colleen Olson is a 52 year old woman with cirrhosis who was admitted with AMS and found to have 2/2 GPCs in clusters from blood cultures and ECOLI UTI with acute renal failure   SIGNIFICANT EVENTS / STUDIES:  1. 12/18  Admitted with AMS  2. 12/19 positive blood cultues, abx started, LP attempted unsuccessfully 3. 12/19 renal consult recommends trx to ICU for HRS treatment  LINES / TUBES: 1. PIV  CULTURES: 1. 12/18 blood cultures, 2/2 positive for GPCs in clusters 2. Ecoli UTi  ANTIBIOTICS: 1. Ceftriaxone 2. Ampicillin 3. Vancomycin 4. Acyclovir (d/c'd today)  INTERVAL HISTORY:  Improving steadily. Transferred to floor. Interactive. Renal function improving good urine output. ID managing abx. Renal following.     PHYSICAL EXAM  VITAL SIGNS: Temp:  [97.7 F (36.5 C)-98.7 F (37.1 C)] 97.8 F (36.6 C) (12/21 1839) Pulse Rate:  [92-100] 93 (12/21 1839) Resp:  [16-26] 20 (12/21 1839) BP: (108-151)/(50-91) 151/68 mmHg (12/21 1839) SpO2:  [94 %-100 %] 98 % (12/21 1839) Weight:  [138.1 kg (304 lb 7.3 oz)] 138.1 kg (304 lb 7.3 oz) (12/21 0800)  INTAKE / OUTPUT: Intake/Output     12/21 0701 - 12/22 0700   I.V. (mL/kg) 700 (5.1)   IV Piggyback 650   Total Intake(mL/kg) 1350 (9.8)   Urine (mL/kg/hr) 700 (0.4)   Stool 200 (0.1)   Total Output 900   Net +450        PHYSICAL EXAMINATION: General:  Obese,  Neuro:  Awake and conversant. Oriented to person and place. Not year (2012)  HEENT:  Normal x for icterus Cardiovascular:  RRR. 1-2+ pitting edema of lower extremities  Lungs:  CTA anteriorly No respiratory distress.  Abdomen:  Obese, soft, nontender. + bowel sounds.   Foley and rectal bag in place Skin:  No rashes, lesions, or breakdown noted.    LABS:  CBC Recent Labs     07/19/13  1636  07/20/13  0400  07/21/13  0425  WBC  8.4  9.0  9.9  HGB  7.6*  7.5*  7.1*  HCT  21.3*  20.5*  19.8*  PLT  120*  86*  81*    Coag's Recent Labs     07/21/13  0425  INR  2.22*    BMET Recent Labs     07/20/13  0400  07/20/13  1010  07/21/13  0425  NA  133*  135  140  K  5.5*  5.2*  3.9  CL  104  104  105  CO2  18*  20  25  BUN  41*  42*  39*  CREATININE  3.85*  3.52*  2.20*  GLUCOSE  126*  107*  116*    Electrolytes Recent Labs     07/20/13  0400  07/20/13  1010  07/21/13  0425  CALCIUM  7.6*  7.6*  8.1*  MG   --    --   1.6  PHOS  6.0*   --   3.9    Sepsis Markers No results found for this basename: LACTICACIDVEN, PROCALCITON, O2SATVEN,  in the last 72 hours  ABG Recent Labs     07/19/13  1520  PHART  7.380  PCO2ART  35.0  PO2ART  63.7*    Liver Enzymes Recent Labs  07/19/13  0245  07/19/13  1636  07/20/13  0400  AST  43*  56*   --   ALT  13  14   --   ALKPHOS  153*  122*   --   BILITOT  3.0*  2.9*   --   ALBUMIN  1.3*  1.2*  1.6*    Cardiac Enzymes Recent Labs     07/20/13  0400  07/20/13  0955  07/21/13  0425  TROPONINI  <0.30  <0.30  <0.30    Glucose Recent Labs     07/20/13  2008  07/21/13  0103  07/21/13  0434  07/21/13  0728  07/21/13  1124  07/21/13  1625  GLUCAP  115*  112*  107*  111*  116*  109*    Imaging Dg Chest Port 1 View  07/20/2013   CLINICAL DATA:  Sepsis, possible aspiration.  EXAM: PORTABLE CHEST - 1 VIEW  COMPARISON:  Chest x-ray dated July 18, 2013  FINDINGS: Since yesterday's study there has developed increased interstitial density within the left lung and in the right upper lobe. The cardiopericardial silhouette remains enlarged. The pulmonary vascularity is indistinct. There is no definite pleural effusion.  IMPRESSION: The findings are consistent with atelectasis or developing pneumonia diffusely on the left and in the right upper lobe. This  may reflect the clinically suspected aspiration.   Electronically Signed   By: David  [place]   On: 07/20/2013 08:36   CXR: 12/18: clear lung fields. Question mild cardiomegaly  ASSESSMENT / PLAN: Active Problems:   Altered mental state   Sepsis   Acute renal failure Cirrhosis MRSA Bacteremia E. Coli UTI Acute delirium Morbid obesity  She is markedly improved. Mental status improved. Vitals stable. Sepsis/shock likely etiology of AMS and acute renal failure. ID and renal following. CCM will sign off. Please call with any questions. D/w Dr. Bradly Chris.   I have personally obtained a history, examined the patient, evaluated laboratory and imaging results, formulated the assessment and plan and placed orders.   Truman Hayward 7:41 PM Covering for Pulmonary and Critical Care Medicine [facility]  07/21/2013, 7:40 PM

## 2013-07-21 NOTE — Progress Notes (Signed)
INFECTIOUS DISEASE PROGRESS NOTE  ID: Colleen Olson is a 52 y.o. female with  Active Problems:   Altered mental state   Sepsis   Acute renal failure  Subjective: Awake, interactive.   Abtx:  Anti-infectives   Start     Dose/Rate Route Frequency Ordered Stop   07/21/13 1200  vancomycin (VANCOCIN) 1,750 mg in sodium chloride 0.9 % 500 mL IVPB     1,750 mg 250 mL/hr over 120 Minutes Intravenous Every 48 hours 07/19/13 1131     07/20/13 0630  ampicillin (OMNIPEN) 2 g in sodium chloride 0.9 % 50 mL IVPB     2 g 150 mL/hr over 20 Minutes Intravenous 3 times per day 07/20/13 0624     07/19/13 1800  ampicillin (OMNIPEN) 2 g in sodium chloride 0.9 % 50 mL IVPB  Status:  Discontinued     2 g 150 mL/hr over 20 Minutes Intravenous Every 12 hours 07/19/13 1214 07/20/13 0616   07/19/13 1600  acyclovir (ZOVIRAX) 320 mg in dextrose 5 % 100 mL IVPB  Status:  Discontinued     320 mg 106.4 mL/hr over 60 Minutes Intravenous Every 24 hours 07/19/13 1214 07/20/13 0327   07/19/13 1400  cefTRIAXone (ROCEPHIN) 2 g in dextrose 5 % 50 mL IVPB  Status:  Discontinued     2 g 100 mL/hr over 30 Minutes Intravenous Every 12 hours 07/19/13 1214 07/20/13 1135   07/19/13 1200  vancomycin (VANCOCIN) 2,500 mg in sodium chloride 0.9 % 500 mL IVPB     2,500 mg 250 mL/hr over 120 Minutes Intravenous  Once 07/19/13 1131 07/19/13 1612   07/18/13 1615  cefTRIAXone (ROCEPHIN) 1 g in dextrose 5 % 50 mL IVPB  Status:  Discontinued     1 g 100 mL/hr over 30 Minutes Intravenous Every 24 hours 07/18/13 1603 07/18/13 1604      Medications:  Scheduled: . albumin human  25 g Intravenous Q6H  . ampicillin (OMNIPEN) IV  2 g Intravenous Q8H  . calcium carbonate  1 tablet Oral Daily  . Chlorhexidine Gluconate Cloth  6 each Topical Q0600  . fluticasone  2 spray Each Nare Daily  . lactulose  20 g Oral TID  . mupirocin ointment  1 application Nasal BID  . sodium chloride  3 mL Intravenous Q12H  . sodium polystyrene  30  g Per Tube Once  . vancomycin  1,750 mg Intravenous Q48H    Objective: Vital signs in last 24 hours: Temp:  [97.7 F (36.5 C)-98.7 F (37.1 C)] 98 F (36.7 C) (12/21 0745) Pulse Rate:  [93-100] 93 (12/21 1000) Resp:  [16-26] 22 (12/21 1000) BP: (108-131)/(50-90) 131/71 mmHg (12/21 1000) SpO2:  [92 %-100 %] 97 % (12/21 1000) Weight:  [138.1 kg (304 lb 7.3 oz)] 138.1 kg (304 lb 7.3 oz) (12/21 0800)   General appearance: alert, cooperative and no distress Eyes: positive findings: sclera icterus Resp: clear to auscultation bilaterally Cardio: regular rate and rhythm GI: normal findings: bowel sounds normal and soft, non-tender Extremities: edema >3+ BLE  Lab Results  Recent Labs  07/20/13 0400 07/20/13 1010 07/21/13 0425  WBC 9.0  --  9.9  HGB 7.5*  --  7.1*  HCT 20.5*  --  19.8*  NA 133* 135 140  K 5.5* 5.2* 3.9  CL 104 104 105  CO2 18* 20 25  BUN 41* 42* 39*  CREATININE 3.85* 3.52* 2.20*   Liver Panel  Recent Labs  07/19/13 0245 07/19/13 1636 07/20/13 0400  PROT 7.4 7.2  --   ALBUMIN 1.3* 1.2* 1.6*  AST 43* 56*  --   ALT 13 14  --   ALKPHOS 153* 122*  --   BILITOT 3.0* 2.9*  --    Sedimentation Rate No results found for this basename: ESRSEDRATE,  in the last 72 hours C-Reactive Protein No results found for this basename: CRP,  in the last 72 hours  Microbiology: Recent Results (from the past 240 hour(s))  CULTURE, BLOOD (ROUTINE X 2)     Status: None   Collection Time    07/18/13  4:55 PM      Result Value Range Status   Specimen Description BLOOD ARM RIGHT   Final   Special Requests BOTTLES DRAWN AEROBIC AND ANAEROBIC 10CC   Final   Culture  Setup Time     Final   Value: 07/19/2013 02:04     Performed at [facility]   Culture     Final   Value: METHICILLIN RESISTANT STAPHYLOCOCCUS AUREUS     Note: RIFAMPIN AND GENTAMICIN SHOULD NOT BE USED AS SINGLE DRUGS FOR TREATMENT OF STAPH INFECTIONS. CRITICAL RESULT CALLED TO, READ BACK BY AND  VERIFIED WITH: [facility] MACAFERO 07/21/13 @ 9:11AM BY RUSCOE A.     Note: Gram Stain Report Called to,Read Back By and Verified With: ASHLEY LARSON 07/19/13 1030 BY SMITHERSJ     Performed at [facility]   Report Status 07/21/2013 FINAL   Final   Organism ID, Bacteria METHICILLIN RESISTANT STAPHYLOCOCCUS AUREUS   Final  CULTURE, BLOOD (ROUTINE X 2)     Status: None   Collection Time    07/18/13  5:05 PM      Result Value Range Status   Specimen Description BLOOD HAND LEFT   Final   Special Requests BOTTLES DRAWN AEROBIC ONLY 10CC   Final   Culture  Setup Time     Final   Value: 07/19/2013 02:03     Performed at [facility]   Culture     Final   Value: METHICILLIN RESISTANT STAPHYLOCOCCUS AUREUS     Note: SUSCEPTIBILITIES PERFORMED ON PREVIOUS CULTURE WITHIN THE LAST 5 DAYS.     Note: Gram Stain Report Called to,Read Back By and Verified With: JANET BEASLEY 07/19/13 1035 BY SMITHERSJ     Performed at [facility]   Report Status 07/21/2013 FINAL   Final  MRSA PCR SCREENING     Status: Abnormal   Collection Time    07/19/13  2:37 PM      Result Value Range Status   MRSA by PCR POSITIVE (*) NEGATIVE Final   Comment:            The GeneXpert MRSA Assay (FDA     approved for NASAL specimens     only), is one component of a     comprehensive MRSA colonization     surveillance program. It is not     intended to diagnose MRSA     infection nor to guide or     monitor treatment for     MRSA infections.     RESULT CALLED TO, READ BACK BY AND VERIFIED WITH:     C. SCHILLER RN 15:50 07/19/13 ([email or web address])  URINE CULTURE     Status: None   Collection Time    07/19/13  4:15 PM      Result Value Range Status   Specimen Description URINE, CATHETERIZED   Final   Special Requests  NONE   Final   Culture  Setup Time     Final   Value: 07/19/2013 21:45     Performed at [facility] Count     Final   Value: 25,000 COLONIES/ML     Performed at [facility]   Culture     Final   Value: ESCHERICHIA COLI     Performed at [facility]   Report Status 07/21/2013 FINAL   Final   Organism ID, Bacteria ESCHERICHIA COLI   Final  CULTURE, BLOOD (ROUTINE X 2)     Status: None   Collection Time    07/20/13 12:20 PM      Result Value Range Status   Specimen Description BLOOD LEFT ARM   Final   Special Requests BOTTLES DRAWN AEROBIC ONLY 10CC   Final   Culture  Setup Time     Final   Value: 07/20/2013 20:21     Performed at [facility]   Culture     Final   Value:        BLOOD CULTURE RECEIVED NO GROWTH TO DATE CULTURE WILL BE HELD FOR 5 DAYS BEFORE ISSUING A FINAL NEGATIVE REPORT     Performed at [facility]   Report Status PENDING   Incomplete    Studies/Results: Dg Chest Port 1 View  07/20/2013   CLINICAL DATA:  Sepsis, possible aspiration.  EXAM: PORTABLE CHEST - 1 VIEW  COMPARISON:  Chest x-ray dated July 18, 2013  FINDINGS: Since yesterday's study there has developed increased interstitial density within the left lung and in the right upper lobe. The cardiopericardial silhouette remains enlarged. The pulmonary vascularity is indistinct. There is no definite pleural effusion.  IMPRESSION: The findings are consistent with atelectasis or developing pneumonia diffusely on the left and in the right upper lobe. This may reflect the clinically suspected aspiration.   Electronically Signed   By: David  [place]   On: 07/20/2013 08:36   Dg Fluoro Guide Lumbar Puncture  07/19/2013   CLINICAL DATA:  Altered mental status.  EXAM: DIAGNOSTIC LUMBAR PUNCTURE UNDER FLUOROSCOPIC GUIDANCE  FLUOROSCOPY TIME:  1 min, 27 seconds  PROCEDURE: Informed consent was obtained from the patient prior to the procedure, including potential complications of headache, allergy, and pain. With the patient prone, the lower back was prepped with Betadine. 1% Lidocaine was used for local anesthesia. Lumbar puncture was attempted initially at  the L4-5 level using a gauge needle. Despite multiple attempts, this was unsuccessful. Therefore, attempts were made at L3-4, again with unsuccessful return of CSF. At this point, a cross-table lateral view of the lumbar spine was obtained which demonstrates the medial at the appropriate depth of the spinal canal. Given no return of CSF, a 3rd attempt was made at the L2-3 level, also unsuccessful.  IMPRESSION: Multiple attempts that lumbar puncture with fluoroscopic guidance for unsuccessful less described above. This was discussed with Dr. Clinton Sawyer at the time of the procedure.   Electronically Signed   By: Charlett Nose M.D.   On: 07/19/2013 19:20     Assessment/Plan: MRSA bacteremia  Cirrhosis ARF Hepatitis C Ab+ but RNA (-) with cirrhosis  E coli UTI  Would  Continue ampicillin (for UCx e coli) Continue vanco  Await repeat BCx  Will check C diff (could be due to anbx, esp Amp?) Cr better, mental status better Blood txf?  Total days of antibiotics: 4 vanco 12/19 --> ampicillin 12/20 --> Ceftriaxone 12/19 -->  12/20  ACV 12/19 ---12/20 Amp 12/19 ---12/20          [name] Infectious Diseases (pager) [phone] [email or web address] 07/21/2013, 10:50 AM  LOS: 3 days

## 2013-07-21 NOTE — Progress Notes (Signed)
Patient ID: Lainey Nelson, female   DOB: Dec 09, 1960, 52 y.o.   MRN: [id number] S:awake but still delirious O:BP 118/73  Pulse 96  Temp(Src) 98 F (36.7 C) (Oral)  Resp 20  Ht 5\' 8"  (1.727 m)  Wt 138.1 kg (304 lb 7.3 oz)  BMI 46.30 kg/m2  SpO2 94%  Intake/Output Summary (Last 24 hours) at 07/21/13 0906 Last data filed at 07/21/13 0645  Gross per 24 hour  Intake   2503 ml  Output   1790 ml  Net    713 ml   Intake/Output: I/O last 3 completed shifts: In: 3813.6 [I.V.:2607.2; IV Piggyback:1206.4] Out: 2195 [Urine:2195]  Intake/Output this shift:    Weight change: -0.4 kg (-14.1 oz) Gen:WD obese AAF lying in bed, awake but disoriented/delirious CVS:no rub Resp:decreased BS at bases NWG:NFAOZ, +BS Ext:1+ pitting pretib edema   Recent Labs Lab 07/18/13 1201 07/18/13 1519 07/19/13 0245 07/19/13 1636 07/19/13 2138 07/20/13 0400 07/20/13 1010 07/21/13 0425  NA 131* 134* 134* 132* 133* 133* 135 140  K 6.8* 5.8* 6.0* 6.4* 5.4* 5.5* 5.2* 3.9  CL 102 102 104 103 104 104 104 105  CO2 21 23 20 20 21  18* 20 25  GLUCOSE 107* 77 104* 96 91 126* 107* 116*  BUN 31* 31* 36* 39* 42* 41* 42* 39*  CREATININE 4.98* 4.84* 5.13* 4.53* 4.34* 3.85* 3.52* 2.20*  ALBUMIN 1.6*  --  1.3* 1.2*  --  1.6*  --   --   CALCIUM 8.3* 8.3* 7.9* 7.4* 7.2* 7.6* 7.6* 8.1*  PHOS  --   --   --   --   --  6.0*  --  3.9  AST 45*  --  43* 56*  --   --   --   --   ALT 15  --  13 14  --   --   --   --    Liver Function Tests:  Recent Labs Lab 07/18/13 1201 07/19/13 0245 07/19/13 1636 07/20/13 0400  AST 45* 43* 56*  --   ALT 15 13 14   --   ALKPHOS 157* 153* 122*  --   BILITOT 3.6* 3.0* 2.9*  --   PROT 8.1 7.4 7.2  --   ALBUMIN 1.6* 1.3* 1.2* 1.6*   No results found for this basename: LIPASE, AMYLASE,  in the last 168 hours  Recent Labs Lab 07/18/13 1201 07/19/13 1636  AMMONIA 52 91*   CBC:  Recent Labs Lab 07/18/13 1201 07/19/13 0245 07/19/13 1636 07/20/13 0400 07/21/13 0425  WBC  13.7* 12.2* 8.4 9.0 9.9  HGB 8.1* 7.6* 7.6* 7.5* 7.1*  HCT 22.8* 21.1* 21.3* 20.5* 19.8*  MCV 77.0* 75.6* 75.3* 75.1* 74.4*  PLT 121* 107* 120* 86* 81*   Cardiac Enzymes:  Recent Labs Lab 07/19/13 1636 07/19/13 2138 07/20/13 0400 07/20/13 0955 07/21/13 0425  TROPONINI <0.30 <0.30 <0.30 <0.30 <0.30   CBG:  Recent Labs Lab 07/20/13 1540 07/20/13 2008 07/21/13 0103 07/21/13 0434 07/21/13 0728  GLUCAP 106* 115* 112* 107* 111*    Iron Studies: No results found for this basename: IRON, TIBC, TRANSFERRIN, FERRITIN,  in the last 72 hours Studies/Results: Dg Chest Port 1 View  07/20/2013   CLINICAL DATA:  Sepsis, possible aspiration.  EXAM: PORTABLE CHEST - 1 VIEW  COMPARISON:  Chest x-ray dated July 18, 2013  FINDINGS: Since yesterday's study there has developed increased interstitial density within the left lung and in the right upper lobe. The cardiopericardial silhouette remains enlarged. The pulmonary vascularity  is indistinct. There is no definite pleural effusion.  IMPRESSION: The findings are consistent with atelectasis or developing pneumonia diffusely on the left and in the right upper lobe. This may reflect the clinically suspected aspiration.   Electronically Signed   By: David  [place]   On: 07/20/2013 08:36   Dg Fluoro Guide Lumbar Puncture  07/19/2013   CLINICAL DATA:  Altered mental status.  EXAM: DIAGNOSTIC LUMBAR PUNCTURE UNDER FLUOROSCOPIC GUIDANCE  FLUOROSCOPY TIME:  1 min, 27 seconds  PROCEDURE: Informed consent was obtained from the patient prior to the procedure, including potential complications of headache, allergy, and pain. With the patient prone, the lower back was prepped with Betadine. 1% Lidocaine was used for local anesthesia. Lumbar puncture was attempted initially at the L4-5 level using a gauge needle. Despite multiple attempts, this was unsuccessful. Therefore, attempts were made at L3-4, again with unsuccessful return of CSF. At this point, a  cross-table lateral view of the lumbar spine was obtained which demonstrates the medial at the appropriate depth of the spinal canal. Given no return of CSF, a 3rd attempt was made at the L2-3 level, also unsuccessful.  IMPRESSION: Multiple attempts that lumbar puncture with fluoroscopic guidance for unsuccessful less described above. This was discussed with Dr. Clinton Sawyer at the time of the procedure.   Electronically Signed   By: Charlett Nose M.D.   On: 07/19/2013 19:20   . albumin human  25 g Intravenous Q6H  . ampicillin (OMNIPEN) IV  2 g Intravenous Q8H  . calcium carbonate  1 tablet Oral Daily  . Chlorhexidine Gluconate Cloth  6 each Topical Q0600  . fluticasone  2 spray Each Nare Daily  . lactulose  20 g Oral TID  . mupirocin ointment  1 application Nasal BID  . sodium chloride  3 mL Intravenous Q12H  . sodium polystyrene  30 g Per Tube Once  . vancomycin  1,750 mg Intravenous Q48H    BMET    Component Value Date/Time   NA 140 07/21/2013 0425   K 3.9 07/21/2013 0425   CL 105 07/21/2013 0425   CO2 25 07/21/2013 0425   GLUCOSE 116* 07/21/2013 0425   BUN 39* 07/21/2013 0425   CREATININE 2.20* 07/21/2013 0425   CREATININE 1.38* 07/01/2013 1515   CALCIUM 8.1* 07/21/2013 0425   GFRNONAA 24* 07/21/2013 0425   GFRAA 28* 07/21/2013 0425   CBC    Component Value Date/Time   WBC 9.9 07/21/2013 0425   RBC 2.66* 07/21/2013 0425   RBC 3.28* 04/07/2013 2025   HGB 7.1* 07/21/2013 0425   HCT 19.8* 07/21/2013 0425   PLT 81* 07/21/2013 0425   MCV 74.4* 07/21/2013 0425   MCH 26.7 07/21/2013 0425   MCHC 35.9 07/21/2013 0425   RDW 17.2* 07/21/2013 0425   LYMPHSABS 2.2 04/07/2013 1542   MONOABS 0.7 04/07/2013 1542   EOSABS 0.1 04/07/2013 1542   BASOSABS 0.1 04/07/2013 1542     Assessment/Plan:  1. 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 Ordered urine studies to help evaluate further  and FeNa is 0.27% c/w pre-renal and/or hepatorenal, however she appears more stable today. Marked improvement of UOP overnight and Scr which supports pre-renal over HRS (thankfully).  1. Cont with IVF's of isotonic bicarb and BP support for now but may want to change to NS at lower rate until she can take po.  2. Albumin helped BP, mental status,  and UOP. (Pt is not a candidate for liver transplant d/t ongoing polysubstance abuse).  Would stop this today. 3. Appreciate PCCM input. Pt was transferred to ICU for levophed and vasopressin IV gtt however patient's daughter wanted to wait before placement of central line and thankfully she has improved with albumin and IVF's. No indication for levophed given overall improvement of AMS/BP and ARF.  Agree with transfer to the floor.  4. Would not offer HD as this will not impact her survival if she is not a transplant candidate.  5. Palliative care consult to help set goals/limits of care as if she is in HRS, prognosis is grim without transplantation even though her current clinical situation is improving. 2. Hyperkalemia- resolved 3. Hyponatremia- resolved with volume replacement. 4. MRSA bacteremia- per primary svc on Vanco, ID following. 5. Metabolic acidosis- started bicarb gtt and improved.  May want to switch to NS if CO2 cont to climb. 6. Cirrhosis- elevated INR and LFT's, per primary svc 7. AMS- as above. She has improved with albumin and better BP. ?meningitis/SIRS/HRS/hepatic encephelopathy (NH3 elevated to 91). May need to be intubated due to lack of airway protection if she becomes unresponsive again. For LP per Primary svc and cultures pending. 8. Hypoxia on ABG- as above consult PCCM 9. Hypotension/SIRS- hemodynamically improving.   10. Dispo- palliative care consult as above 11.   Jennalyn Cawley A

## 2013-07-21 NOTE — Progress Notes (Signed)
Report given to RN [place] on [address] . Dr Tyson Alias made aware of new location,  stated he will see patient on floor and he also contacted family medicine. Dr. Tyson Alias and Dr Mauricio Po made aware of family requesting a meeting to discuss plan of care and patients prognosis.

## 2013-07-21 NOTE — Significant Event (Signed)
Pt with frequent BM's.  Will have flexiseal placed.  Coralyn Helling, MD 07/21/2013, 12:32 AM

## 2013-07-22 DIAGNOSIS — A419 Sepsis, unspecified organism: Secondary | ICD-10-CM

## 2013-07-22 DIAGNOSIS — I10 Essential (primary) hypertension: Secondary | ICD-10-CM

## 2013-07-22 DIAGNOSIS — G9340 Encephalopathy, unspecified: Secondary | ICD-10-CM

## 2013-07-22 DIAGNOSIS — D638 Anemia in other chronic diseases classified elsewhere: Secondary | ICD-10-CM

## 2013-07-22 DIAGNOSIS — R748 Abnormal levels of other serum enzymes: Secondary | ICD-10-CM

## 2013-07-22 LAB — GLUCOSE, CAPILLARY
Glucose-Capillary: 123 mg/dL — ABNORMAL HIGH (ref 70–99)
Glucose-Capillary: 137 mg/dL — ABNORMAL HIGH (ref 70–99)
Glucose-Capillary: 137 mg/dL — ABNORMAL HIGH (ref 70–99)
Glucose-Capillary: 137 mg/dL — ABNORMAL HIGH (ref 70–99)
Glucose-Capillary: 160 mg/dL — ABNORMAL HIGH (ref 70–99)
Glucose-Capillary: 166 mg/dL — ABNORMAL HIGH (ref 70–99)

## 2013-07-22 LAB — BASIC METABOLIC PANEL
Calcium: 8.8 mg/dL (ref 8.4–10.5)
Chloride: 102 mEq/L (ref 96–112)
Creatinine, Ser: 0.89 mg/dL (ref 0.50–1.10)
GFR calc Af Amer: 84 mL/min — ABNORMAL LOW (ref >90)
GFR calc non Af Amer: 72 mL/min — ABNORMAL LOW (ref >90)
Sodium: 136 mEq/L (ref 135–145)

## 2013-07-22 LAB — CBC
HCT: 23.3 % — ABNORMAL LOW (ref 36.0–46.0)
MCHC: 36.1 g/dL — ABNORMAL HIGH (ref 30.0–36.0)
Platelets: 68 10*3/uL — ABNORMAL LOW (ref 150–400)
RDW: 17.1 % — ABNORMAL HIGH (ref 11.5–15.5)
WBC: 11.5 10*3/uL — ABNORMAL HIGH (ref 4.0–10.5)

## 2013-07-22 MED ORDER — PANTOPRAZOLE SODIUM 40 MG IV SOLR
40.0000 mg | Freq: Two times a day (BID) | INTRAVENOUS | Status: DC
  Administered 2013-07-22 – 2013-07-31 (×18): 40 mg via INTRAVENOUS
  Filled 2013-07-22 (×21): qty 40

## 2013-07-22 NOTE — Consult Note (Addendum)
Unassigned Patient  Reason for Consult: Cirrhosis and Anemia Referring Physician: Teaching Service.  Colleen Olson HPI: This is a 52 year old alcoholic cirrhotic admitted for AMS.  Her AMS started several months ago and it has progressively worsened.  She was admitted to the hospital and identified to have sepsis.  She was also found to have ARF, but this issue has been improving.  Because of her situation, a GI consultation was requested for recommendations about her prognosis.  She was noted to have a progressive anemia, but no records of any endoscopic evaluations.  Her CT scan in 05/2013 does reveal a small lesion that could be a hepatoma.  Past Medical History  Diagnosis Date  . Asthma   . Hypertension   . Angina   . Osteoarthritis of both knees   . Borderline diabetes   . Headache(784.0) 12/02/11    "sometimes when I wake up in am; real bad"  . Arthritis   . Chronic lower back pain   . Tachycardia   . Cirrhosis     Colleen Olson 07/18/2013  . High cholesterol   . Exertional dyspnea 12/01/11  . Shortness of breath     "all the time right now" (07/18/2013)  . On home oxygen therapy     "2L; 24/7" (07/18/2013)  . Type II diabetes mellitus     "not taking RX anymore" (07/18/2013)  . Anxiety   . Depression   . Acute kidney failure 07/18/2013  . Altered mental status     admitting dx 07/18/2013    Past Surgical History  Procedure Laterality Date  . Joint replacement    . Total knee arthroplasty Left 03/02/2011  . Tonsillectomy      "I was a little girl"  . Tubal ligation  1982  . Knee arthroscopy Left 2012    Family History  Problem Relation Age of Onset  . Diabetes Mother   . Diabetes Father   . Diabetes Sister   . Diabetes Brother     Social History:  reports that she has never smoked. She has never used smokeless tobacco. She reports that she drinks alcohol. She reports that she does not use illicit drugs.  Allergies: No Known Allergies  Medications:   Scheduled: . ampicillin (OMNIPEN) IV  2 g Intravenous Q8H  . calcium carbonate  1 tablet Oral Daily  . Chlorhexidine Gluconate Cloth  6 each Topical Q0600  . fluticasone  2 spray Each Nare Daily  . lactulose  20 g Oral TID  . mupirocin ointment  1 application Nasal BID  . pantoprazole (PROTONIX) IV  40 mg Intravenous Q12H  . sodium chloride  3 mL Intravenous Q12H  . sodium polystyrene  30 g Per Tube Once  . vancomycin  1,750 mg Intravenous Q24H   Continuous:   Results for orders placed during the hospital encounter of 07/18/13 (from the past 24 hour(s))  GLUCOSE, CAPILLARY     Status: Abnormal   Collection Time    07/21/13  8:21 PM      Result Value Range   Glucose-Capillary 123 (*) 70 - 99 mg/dL  GLUCOSE, CAPILLARY     Status: Abnormal   Collection Time    07/21/13 11:58 PM      Result Value Range   Glucose-Capillary 137 (*) 70 - 99 mg/dL  GLUCOSE, CAPILLARY     Status: Abnormal   Collection Time    07/22/13  4:23 AM      Result Value Range   Glucose-Capillary  124 (*) 70 - 99 mg/dL  GLUCOSE, CAPILLARY     Status: Abnormal   Collection Time    07/22/13  8:27 AM      Result Value Range   Glucose-Capillary 137 (*) 70 - 99 mg/dL  GLUCOSE, CAPILLARY     Status: Abnormal   Collection Time    07/22/13 11:43 AM      Result Value Range   Glucose-Capillary 173 (*) 70 - 99 mg/dL   Comment 1 Documented in Chart     Comment 2 Notify RN    CBC     Status: Abnormal   Collection Time    07/22/13 12:40 PM      Result Value Range   WBC 11.5 (*) 4.0 - 10.5 K/uL   RBC 3.06 (*) 3.87 - 5.11 MIL/uL   Hemoglobin 8.4 (*) 12.0 - 15.0 g/dL   HCT 53.6 (*) 64.4 - 03.4 %   MCV 76.1 (*) 78.0 - 100.0 fL   MCH 27.5  26.0 - 34.0 pg   MCHC 36.1 (*) 30.0 - 36.0 g/dL   RDW 74.2 (*) 59.5 - 63.8 %   Platelets 68 (*) 150 - 400 K/uL  BASIC METABOLIC PANEL     Status: Abnormal   Collection Time    07/22/13  3:10 PM      Result Value Range   Sodium 136  135 - 145 mEq/L   Potassium 3.7  3.5 - 5.1  mEq/L   Chloride 102  96 - 112 mEq/L   CO2 26  19 - 32 mEq/L   Glucose, Bld 145 (*) 70 - 99 mg/dL   BUN 24 (*) 6 - 23 mg/dL   Creatinine, Ser 7.56  0.50 - 1.10 mg/dL   Calcium 8.8  8.4 - 43.3 mg/dL   GFR calc non Af Amer 72 (*) >90 mL/min   GFR calc Af Amer 84 (*) >90 mL/min  GLUCOSE, CAPILLARY     Status: Abnormal   Collection Time    07/22/13  3:46 PM      Result Value Range   Glucose-Capillary 160 (*) 70 - 99 mg/dL     No results found.  ROS:  As stated above in the HPI otherwise negative.  Blood pressure 123/58, pulse 102, temperature 98.3 F (36.8 C), temperature source Oral, resp. rate 18, height 5\' 8"  (1.727 m), weight 308 lb 10.3 oz (140 kg), SpO2 100.00%.    PE: Gen: Alert, confused HEENT:  [place]/AT, EOMI, scleral icterus Neck: Supple, no LAD Lungs: CTA Bilaterally CV: RRR without M/G/R ABM: Soft, morbidly obese, +BS Ext: No C/C/E  Assessment/Plan: 1) ESLD ETOH cirrhosis. 2) Hepatic encephalopathy. 3) UTI. 4) Anemia. 5) ? Hepatoma.   The patient has a very poor prognosis.  She does NOT have HCV.  Her HCV RNA was negative.  All of her cirrhosis stems from ETOH abuse.  At this point Hospice care needs to be pursued.  Her INR is steadily increasing and her mental status is poor.  Confusion continues to persist.  I had a very long and frank discussion with the daughter and son.  Her daughter has been with her throughout all of her liver failure issues.  The patient was being seen at [facility] satellite clinic.  Per the daughter they recommended Palliative Care with transition to Hospice care.  I concur with that opinion.  Her synthetic function is declining.    Plan: 1) Hospice care.   2) I strongly advise against any further interventions. 3)  Signing off.  Colleen Olson D 07/22/2013, 6:26 PM

## 2013-07-22 NOTE — Progress Notes (Signed)
Speech Language Pathology Treatment: Dysphagia  Patient Details Name: Colleen Olson MRN: [id number] DOB: 13-Aug-1960 Today's Date: 07/22/2013 Time: 4098-1191 SLP Time Calculation (min): 15 min  Assessment / Plan / Recommendation Clinical Impression  Pt observed eating breakfast today, nearly all of breakfast consumed.  Pt self fed graham cracker, but due to missing front dentition and cognitive status excessive mastication noted with mild oral stasis.    Oral stasis cleared with liquid swallows. No s/s of aspiration with solids today and pt able to self feed.  Pt also required moderate verbal cues to cease talking with intake - perseverating on a wedding/marriage.   Rec advance diet to soft/pureed meat/thin with intermittent supervision - assess for oral stasis after meal completion.  Suspect pt will do better eating solitarily as she tends to talk with visitors in room.   SLP to follow briefly to assure tolerance and to educate pt/family to precautions to mitigate aspiration risk.  Hopeful for ongoing improvement with cognitive advancements.   Also question if pt has a partial- RN reports she will ask pt's daughter today.     HPI HPI: Colleen Olson is a 52 y.o. female presented to hospital 12/18 with AMS and acute changes in functional status over the past 4 days. 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 ETOH abuse, Asthma, HTN, Borderline DM, HA, Arthritis, Chronic back pain.  Pt underwent bedside swallow evaluation with indications of cognitive based dysphagia and was placed on a full liquid diet.  Today pt seen for diagnostic treatment to determine readiness for dietary advancement.     Pertinent Vitals Afebrile, decreased  SLP Plan  Continue with current plan of care    Recommendations Diet recommendations: Dysphagia 3 (mechanical soft);pureed meats, Thin liquid Liquids provided via: Straw;Cup Medication Administration:  (as tolerated) Supervision: Patient able to self feed  (assess for oral residuals after completion of meal) Compensations: Slow rate;Small sips/bites;Check for pocketing Postural Changes and/or Swallow Maneuvers: Seated upright 90 degrees;Upright 30-60 min after meal              Oral Care Recommendations: Oral care BID Follow up Recommendations: Other (comment) Plan: Continue with current plan of care    GO     Donavan Burnet, MS [facility] SLP [phone]

## 2013-07-22 NOTE — Progress Notes (Signed)
ANTIBIOTIC CONSULT NOTE - Follow-Up  Pharmacy Consult for Vancomycin,  Ampicillin Indication: MRSA bacteremia and EColi UTI  No Known Allergies  Patient Measurements: Height: 5\' 8"  (172.7 cm) Weight: 308 lb 10.3 oz (140 kg) IBW/kg (Calculated) : 63.9 Weight ~ 123 kg Height ~ 68 inches IBW: 64 kg  Vital Signs: Temp: 97.9 F (36.6 C) (12/22 1004) Temp src: Oral (12/22 0614) BP: 131/70 mmHg (12/22 1004) Pulse Rate: 105 (12/22 1004) Intake/Output from previous day: 12/21 0701 - 12/22 0700 In: 1710 [P.O.:360; I.V.:700; IV Piggyback:650] Out: 2150 [Urine:1950; Stool:200] Intake/Output from this shift:    Labs:  Recent Labs  07/19/13 1615 07/19/13 1636  07/20/13 0400 07/20/13 1010 07/21/13 0425  WBC  --  8.4  --  9.0  --  9.9  HGB  --  7.6*  --  7.5*  --  7.1*  PLT  --  120*  --  86*  --  81*  LABCREA 287.12  --   --   --   --   --   CREATININE  --  4.53*  < > 3.85* 3.52* 2.20*  < > = values in this interval not displayed. Estimated CrCl ~ 14 ml/min   Microbiology: Recent Results (from the past 720 hour(s))  CULTURE, BLOOD (ROUTINE X 2)     Status: None   Collection Time    07/18/13  4:55 PM      Result Value Range Status   Specimen Description BLOOD ARM RIGHT   Final   Special Requests BOTTLES DRAWN AEROBIC AND ANAEROBIC 10CC   Final   Culture  Setup Time     Final   Value: 07/19/2013 02:04     Performed at [facility]   Culture     Final   Value: METHICILLIN RESISTANT STAPHYLOCOCCUS AUREUS     Note: RIFAMPIN AND GENTAMICIN SHOULD NOT BE USED AS SINGLE DRUGS FOR TREATMENT OF STAPH INFECTIONS. CRITICAL RESULT CALLED TO, READ BACK BY AND VERIFIED WITH: [facility] MACAFERO 07/21/13 @ 9:11AM BY RUSCOE A.     Note: Gram Stain Report Called to,Read Back By and Verified With: ASHLEY LARSON 07/19/13 1030 BY SMITHERSJ     Performed at [facility]   Report Status 07/21/2013 FINAL   Final   Organism ID, Bacteria METHICILLIN RESISTANT STAPHYLOCOCCUS AUREUS    Final  CULTURE, BLOOD (ROUTINE X 2)     Status: None   Collection Time    07/18/13  5:05 PM      Result Value Range Status   Specimen Description BLOOD HAND LEFT   Final   Special Requests BOTTLES DRAWN AEROBIC ONLY 10CC   Final   Culture  Setup Time     Final   Value: 07/19/2013 02:03     Performed at [facility]   Culture     Final   Value: METHICILLIN RESISTANT STAPHYLOCOCCUS AUREUS     Note: SUSCEPTIBILITIES PERFORMED ON PREVIOUS CULTURE WITHIN THE LAST 5 DAYS.     Note: Gram Stain Report Called to,Read Back By and Verified With: JANET BEASLEY 07/19/13 1035 BY SMITHERSJ     Performed at [facility]   Report Status 07/21/2013 FINAL   Final  MRSA PCR SCREENING     Status: Abnormal   Collection Time    07/19/13  2:37 PM      Result Value Range Status   MRSA by PCR POSITIVE (*) NEGATIVE Final   Comment:  The GeneXpert MRSA Assay (FDA     approved for NASAL specimens     only), is one component of a     comprehensive MRSA colonization     surveillance program. It is not     intended to diagnose MRSA     infection nor to guide or     monitor treatment for     MRSA infections.     RESULT CALLED TO, READ BACK BY AND VERIFIED WITH:     C. SCHILLER RN 15:50 07/19/13 ([email or web address])  URINE CULTURE     Status: None   Collection Time    07/19/13  4:15 PM      Result Value Range Status   Specimen Description URINE, CATHETERIZED   Final   Special Requests NONE   Final   Culture  Setup Time     Final   Value: 07/19/2013 21:45     Performed at [facility] Count     Final   Value: 25,000 COLONIES/ML     Performed at [facility]   Culture     Final   Value: ESCHERICHIA COLI     Performed at [facility]   Report Status 07/21/2013 FINAL   Final   Organism ID, Bacteria ESCHERICHIA COLI   Final  CULTURE, BLOOD (ROUTINE X 2)     Status: None   Collection Time    07/20/13 12:20 PM      Result Value Range Status   Specimen  Description BLOOD LEFT ARM   Final   Special Requests BOTTLES DRAWN AEROBIC ONLY 10CC   Final   Culture  Setup Time     Final   Value: 07/20/2013 20:21     Performed at [facility]   Culture     Final   Value:        BLOOD CULTURE RECEIVED NO GROWTH TO DATE CULTURE WILL BE HELD FOR 5 DAYS BEFORE ISSUING A FINAL NEGATIVE REPORT     Performed at [facility]   Report Status PENDING   Incomplete  CLOSTRIDIUM DIFFICILE BY PCR     Status: None   Collection Time    07/21/13  2:17 PM      Result Value Range Status   C difficile by pcr NEGATIVE  NEGATIVE Final    Medical History: Past Medical History  Diagnosis Date  . Asthma   . Hypertension   . Angina   . Osteoarthritis of both knees   . Borderline diabetes   . Headache(784.0) 12/02/11    "sometimes when I wake up in am; real bad"  . Arthritis   . Chronic lower back pain   . Tachycardia   . Cirrhosis     [name] 07/18/2013  . High cholesterol   . Exertional dyspnea 12/01/11  . Shortness of breath     "all the time right now" (07/18/2013)  . On home oxygen therapy     "2L; 24/7" (07/18/2013)  . Type II diabetes mellitus     "not taking RX anymore" (07/18/2013)  . Anxiety   . Depression   . Acute kidney failure 07/18/2013  . Altered mental status     admitting dx 07/18/2013    Medications:  Scheduled:  . albumin human  25 g Intravenous Q6H  . ampicillin (OMNIPEN) IV  2 g Intravenous Q8H  . calcium carbonate  1 tablet Oral Daily  . Chlorhexidine Gluconate Cloth  6 each Topical Q0600  .  fluticasone  2 spray Each Nare Daily  . lactulose  20 g Oral TID  . mupirocin ointment  1 application Nasal BID  . sodium chloride  3 mL Intravenous Q12H  . sodium polystyrene  30 g Per Tube Once  . vancomycin  1,750 mg Intravenous Q24H   Assessment: 52 yo F admitted 12/18 with 4d history of AMS.  Empirically started on Rocephin for possible infection.  Blood cx now with MRSA; Urine cx with EColi - antibiotics narrowed to  vancomycin 1750 mg q 24 hrs and ampicillin 2g q8 hrs.    Of note, patient also presented with ARF, SCr 4.98 on admission (baseline 1.4).  SCr currently 2.2 with estimated CrCl ~ 43 ml/min.  Antibiotic doses appropriate for weight and improving renal function.  Goal of Therapy:  Vancomycin trough level 15-20 mcg/ml Renal dose adjustment of antibiotics Eradication of infection  Plan:  1. Continue Vancomycin 1750 mg IV q 24 hrs. 2. Continue ampicillin 2g IV q 8 hrs. 3. Follow culture data, clinical progress, and changes in renal function.. 4. Check Vancomycin trough levels ASAP (Wed or Thurs)  Reece Leader, Loura Back, BCPS  Clinical Pharmacist Pager [phone]  07/22/2013 10:22 AM

## 2013-07-22 NOTE — Progress Notes (Signed)
Interim Progress Note:  Patient's son Rachael Fee Lequita Halt) and daughter Lamount Cranker) both present at bedside. The desired to talk about the patient's current condition and prognosis. We reviewed that events that led to escalation of care on Friday and covered her improvement in renal function and mental status. I explained the concept of the MELD score being the tool that we use to predict mortality for the patient. I explained that her MELD score has improved since her kidneys are working better. However, I did explain that her liver function will not improve over time, which puts her at risk for getting sicker. The daughter expects 3-6 month life expectancy based upon last conversation with hepatologist. We also discussed disposition, and they are concerned about her going home in a debilitated state. They would prefer a "facility" where there are "professionals" to look after her. I told them that we would look into that when we approach discharge.   Si Raider Clinton Sawyer, MD, MBA 07/22/2013, 8:15 PM Family Medicine Resident, PGY-3

## 2013-07-22 NOTE — Progress Notes (Signed)
Patient ID: Colleen Olson, female   DOB: [birth year], 52 y.o.   MRN: [id number] S:awake but still seeming a little confused.  Good uop, no labs this am O:BP 114/66  Pulse 96  Temp(Src) 98.1 F (36.7 C) (Oral)  Resp 20  Ht 5\' 8"  (1.727 m)  Wt 140 kg (308 lb 10.3 oz)  BMI 46.94 kg/m2  SpO2 97%  Intake/Output Summary (Last 24 hours) at 07/22/13 1233 Last data filed at 07/22/13 0600  Gross per 24 hour  Intake   1210 ml  Output   1700 ml  Net   -490 ml   Intake/Output: I/O last 3 completed shifts: In: 3113 [P.O.:360; I.V.:1803; IV Piggyback:950] Out: 3265 [Urine:3065; Stool:200]  Intake/Output this shift:    Weight change: 0 kg (0 lb) Gen:WD obese AAF lying in bed, awake but disoriented/delirious CVS:no rub Resp:decreased BS at bases WUJ:WJXBJ, +BS Ext:1+ pitting pretib edema   Recent Labs Lab 07/18/13 1201 07/18/13 1519 07/19/13 0245 07/19/13 1636 07/19/13 2138 07/20/13 0400 07/20/13 1010 07/21/13 0425  NA 131* 134* 134* 132* 133* 133* 135 140  K 6.8* 5.8* 6.0* 6.4* 5.4* 5.5* 5.2* 3.9  CL 102 102 104 103 104 104 104 105  CO2 21 23 20 20 21  18* 20 25  GLUCOSE 107* 77 104* 96 91 126* 107* 116*  BUN 31* 31* 36* 39* 42* 41* 42* 39*  CREATININE 4.98* 4.84* 5.13* 4.53* 4.34* 3.85* 3.52* 2.20*  ALBUMIN 1.6*  --  1.3* 1.2*  --  1.6*  --   --   CALCIUM 8.3* 8.3* 7.9* 7.4* 7.2* 7.6* 7.6* 8.1*  PHOS  --   --   --   --   --  6.0*  --  3.9  AST 45*  --  43* 56*  --   --   --   --   ALT 15  --  13 14  --   --   --   --    Liver Function Tests:  Recent Labs Lab 07/18/13 1201 07/19/13 0245 07/19/13 1636 07/20/13 0400  AST 45* 43* 56*  --   ALT 15 13 14   --   ALKPHOS 157* 153* 122*  --   BILITOT 3.6* 3.0* 2.9*  --   PROT 8.1 7.4 7.2  --   ALBUMIN 1.6* 1.3* 1.2* 1.6*   No results found for this basename: LIPASE, AMYLASE,  in the last 168 hours  Recent Labs Lab 07/18/13 1201 07/19/13 1636  AMMONIA 52 91*   CBC:  Recent Labs Lab 07/18/13 1201 07/19/13 0245  07/19/13 1636 07/20/13 0400 07/21/13 0425  WBC 13.7* 12.2* 8.4 9.0 9.9  HGB 8.1* 7.6* 7.6* 7.5* 7.1*  HCT 22.8* 21.1* 21.3* 20.5* 19.8*  MCV 77.0* 75.6* 75.3* 75.1* 74.4*  PLT 121* 107* 120* 86* 81*   Cardiac Enzymes:  Recent Labs Lab 07/19/13 1636 07/19/13 2138 07/20/13 0400 07/20/13 0955 07/21/13 0425  TROPONINI <0.30 <0.30 <0.30 <0.30 <0.30   CBG:  Recent Labs Lab 07/21/13 1625 07/21/13 2021 07/21/13 2358 07/22/13 0423 07/22/13 0827  GLUCAP 109* 123* 137* 124* 137*    Iron Studies: No results found for this basename: IRON, TIBC, TRANSFERRIN, FERRITIN,  in the last 72 hours Studies/Results: No results found. Marland Kitchen albumin human  25 g Intravenous Q6H  . ampicillin (OMNIPEN) IV  2 g Intravenous Q8H  . calcium carbonate  1 tablet Oral Daily  . Chlorhexidine Gluconate Cloth  6 each Topical Q0600  . fluticasone  2 spray Each Nare Daily  .  lactulose  20 g Oral TID  . mupirocin ointment  1 application Nasal BID  . pantoprazole (PROTONIX) IV  40 mg Intravenous Q12H  . sodium chloride  3 mL Intravenous Q12H  . sodium polystyrene  30 g Per Tube Once  . vancomycin  1,750 mg Intravenous Q24H    BMET    Component Value Date/Time   NA 140 07/21/2013 0425   K 3.9 07/21/2013 0425   CL 105 07/21/2013 0425   CO2 25 07/21/2013 0425   GLUCOSE 116* 07/21/2013 0425   BUN 39* 07/21/2013 0425   CREATININE 2.20* 07/21/2013 0425   CREATININE 1.38* 07/01/2013 1515   CALCIUM 8.1* 07/21/2013 0425   GFRNONAA 24* 07/21/2013 0425   GFRAA 28* 07/21/2013 0425   CBC    Component Value Date/Time   WBC 9.9 07/21/2013 0425   RBC 2.66* 07/21/2013 0425   RBC 3.28* 04/07/2013 2025   HGB 7.1* 07/21/2013 0425   HCT 19.8* 07/21/2013 0425   PLT 81* 07/21/2013 0425   MCV 74.4* 07/21/2013 0425   MCH 26.7 07/21/2013 0425   MCHC 35.9 07/21/2013 0425   RDW 17.2* 07/21/2013 0425   LYMPHSABS 2.2 04/07/2013 1542   MONOABS 0.7 04/07/2013 1542   EOSABS 0.1 04/07/2013 1542   BASOSABS 0.1 04/07/2013 1542      Assessment/Plan:  1. 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  Marked improvement of UOP overnight and Scr which supports pre-renal over HRS (thankfully).  1. BP better, tank seems full.  Will stop ivf and albumin.  2. Appreciate PCCM input. Pt was transferred to ICU for levophed and vasopressin IV gtt however patient's daughter wanted to wait before placement of central line and thankfully she has improved with albumin and IVF's. No indication for levophed given overall improvement of AMS/BP and ARF.  Agree with transfer to the floor.  3. Would not offer HD as this will not impact her survival if she is not a transplant candidate. Will order labs in AM to hopefully continue to demonstrate improvement in renal function 4. Palliative care consult to help set goals/limits of care as if she is in HRS, prognosis is grim without transplantation even though her current clinical situation is improving. 2. Hyperkalemia- resolved 3. Hyponatremia- resolved  4. MRSA bacteremia- per primary svc on Vanco, ID following. Also on ampicillin  for e coli uti 5. Metabolic acidosis- resolved. Stp bicarb 6. Cirrhosis- elevated INR and LFT's, per primary svc 7. AMS- as above. She has improved with albumin and better BP. ?meningitis/SIRS/HRS/hepatic encephelopathy (NH3 elevated to 91).  8. Hypoxia on ABG- as above consult PCCM 9. Hypotension/SIRS- hemodynamically improving.   10. Dispo- palliative care consult recommended  as above 11.   Sharmaine Bain A

## 2013-07-22 NOTE — Progress Notes (Addendum)
Family Medicine Teaching Service Daily Progress Note Intern Pager: [phone]  Patient name: Colleen Olson Medical record number: [id number] Date of birth: 1960-10-17 Age: 52 y.o. Gender: female  Primary Care Provider: Rodman Pickle, MD Consultants: Nephrology Code Status: Full  Pt Overview and Major Events to Date:  07/18/13 - pt admitted with AMS, severe AKI 07/19/13 - blood cx grow GPC start anti-bx, consult renal, plan to attempt LP 12/20 --> 12/21 - CCM Primary 12/22 - Improved mental status, Hgb 7.1--8.4  Assessment and Plan: Colleen Olson is a 52 y.o. female presenting with AMS and acute changes in functional status x 4 days, ultimately found to have sepsis from bacteremia (MRSA), Acute Renal Failure, and UTI (E.Coli). PMH is significant for Cirrhosis 2/2 NASH and ETOH abuse (pt DOES NOT HAVE HEP C AS RNA NEG), Asthma, HTN, Borderline DM, HA, Arthritis, Chronic back pain  ID: # Sepsis, with MRSA Bacteremia # UTI, E.Coli Initially unclear etiology, considered GI source (US revealed minimal ascites), considered pulmonary etiology with suspected atelectasis vs consolidation. No obvious skin lesions or ulcers. Previously received empiric therapy for possible meningitis, LP unable to be obtained. Confirmed UTI with E.Coli, however not source of bacteremia. - (12/18) Blood culture - positive MRSA - c/s ID, greatly appreciate all recs    - Continue Vancomycin (Day 4)    - repeat blood cultures (12/20) - NGTD x 48 hrs and remains afebrile    - continue Ampicillin (Day 3) for E.Coli UTI  Heme: # Acute on Chronic Anemia, Microcytic, concern for acute blood loss anemia 2/2 UGI Bleed On admission, reported significant event of bloody production from mouth / nose. Worsening of chronic microcytic anemia, positive FOBT in Oct 2014 and likely to have GI bleed now in setting of increasing INR > 2.0. FOBT (positive 07/19/13) Currently no active signs of bleeding, no hematemesis, no  hematochezia - b/l Hgb 9 - 10 - decreasing Hgb trend 8.1-->7.6-->7.5-->7.1-->8.4 (improvement today) - consider transfusion threshold at < 7.0 - initiate Protonix 40mg  IV BID - Concern for potential decompensation if persistent GI bleed, considered FFP transfusion, continue to monitor, low threshold for returning to SDU status - c/s GI, re: evaluate suspected UGI Bleed, greatly appreciate all recs  Neuro: # Acute Encephalopathy, in setting of cirrhosis, suspected secondary sepsis with bacteremia / ARF - Improved Unclear mental status at baseline, concern for hepatic encephalopathy but ammonia initially normal at 52 on admission, has since increased to (91, on 12/19). Initial work-up negative with Head CT, considered meningitis however since determined less likely. Overall suspected primary etiology from bacteremia / acute renal failure. Significant improvement in mental status within past 24 hours, improved orientation remains awake, and able to converse, however still with poor functioning baseline. - continue lactulose - continue to hold narcotics, benzos, anti-cholinergic meds etc d/t poor renal function and likely to worsen delirium  Renal: # AKI, suspected pre-renal etiology - Improved 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  H/o baseline Cr 0.7, with significant increase to 5.0 on admission - improving Cr trend 4.34-->3.85-->3.52-->2.20 - Good UOP 2150 (24 hrs) - continue to hold spironolactone / lasix, nephrotoxic meds C/s Nephrology, greatly appreciate all recommendations   - stopped IVF bicarb / albumin. initially treated with IVF isotonic bicarb, Albumin --> improved BP and UOP   - Would not offer HD (no impact on survival, if not a transplant candidate)   - continue to trend  Metabolic: # Hyperkalemia - Resolved - K+ peak at 6.4 --> improving  trend to 3.9 - previously NGT placed d/t AMS and NPO --> kayexalate, insulin/D50/bicarb. No  documented EKG changes # Hyponatremia - Resolved # Metabolic Acidosis - Resolved - initially received bicarb gtt, since stopped with normalized metabolic disturbances  GI: # Cirrhosis, secondary to chronic alcohol use (continued) and Hep C On admission, reportedly saw Hepatologist about 3 weeks ago. No recent ingestions per daughter. Ammonia 52 in ED, and initially scleral icterus noted on exam. Pt on lasix and spirnolactone at home (per report took all her medications this morning). No evidence of bruising or purpura on admission. UDS (+opiates only).   - continue to hold spironolactone / lasix, nephrotoxic meds - trend LFTs, Coags    - AST 45-->56  / ALT 15-->14    - Albumin 1.6    - Ammonia 52-->91    - T.Bili 2.9    - increasing INR 1.99-->2.22 (suggestive of hepatic synthetic fxn failure) - lactulose BID, titrate to BMs, strict i/os and daily weights - Patient is not a liver transplant candidate due to continued alcohol use   Resp: # Asthma on O2- On 2LNC at home Daughter had noticed pt with increased WOB lately and some gasping sensation. No positional relationship. Difficult to examine lungs 2/2 pt screaming  -cont albuterol prn  -home o2 requirement  -pulse ox continuous  -monitor vs  -loratidine, flonase prn   # Right knee pain- s/p TKR. No acute exacerbating factors. Pt daughter report has not ambulated since surgery.  - Hold chronic pain meds given AMS  FEN/GI: NPO, cont NS at 125 ml/hr PPx: INR 2, no anticoagulant  Disposition: Patient admitted to floor, transferred to SDU and then to the ICU, managed by PCCM 12/20 to 12/21, transferred back to floor status. Overall continued work-up and medical stabilization to determine outcome and future plan in patient with poor long-term prognosis. Expect multiple day hospital course.  Subjective: Patient's mental status significantly improved within past 24 hours, however difficult interactions and unable to carry actual  conversation. Speech and thoughts are tangential and doesn't always directly respond to simple questions. Unable to get accurate review of systems.  Objective: Temp:  [97.5 F (36.4 C)-98.5 F (36.9 C)] 98.3 F (36.8 C) (12/22 1321) Pulse Rate:  [88-105] 102 (12/22 1321) Resp:  [18-20] 18 (12/22 1321) BP: (114-151)/(53-71) 123/58 mmHg (12/22 1321) SpO2:  [95 %-100 %] 100 % (12/22 1321) Weight:  [308 lb 10.3 oz (140 kg)] 308 lb 10.3 oz (140 kg) (12/22 0500) Physical Exam: General: obese, altered mentation but fully awake and alert, tangential speech / conversation, NAD HEENT: NCAT, PERRL, without scleral icterus, MMM Cardiovascular: RRR, no murmurs heard Respiratory: CTAB Abdomen: obese, soft, NTND, +active BS Extremities: WWP, b/l LE +1-2 pitting edema, with evidence of TKR on left Skin: no evidence of bruising or bleeding Neuro: awake, alert, oriented (person, month only), follows commands Psych: tangential thoughts and speech / poor eye contact  Laboratory:  Recent Labs Lab 07/20/13 0400 07/21/13 0425 07/22/13 1240  WBC 9.0 9.9 11.5*  HGB 7.5* 7.1* 8.4*  HCT 20.5* 19.8* 23.3*  PLT 86* 81* 68*    Recent Labs Lab 07/18/13 1201  07/19/13 0245 07/19/13 1636  07/20/13 0400 07/20/13 1010 07/21/13 0425  NA 131*  < > 134* 132*  < > 133* 135 140  K 6.8*  < > 6.0* 6.4*  < > 5.5* 5.2* 3.9  CL 102  < > 104 103  < > 104 104 105  CO2 21  < >  20 20  < > 18* 20 25  BUN 31*  < > 36* 39*  < > 41* 42* 39*  CREATININE 4.98*  < > 5.13* 4.53*  < > 3.85* 3.52* 2.20*  CALCIUM 8.3*  < > 7.9* 7.4*  < > 7.6* 7.6* 8.1*  PROT 8.1  --  7.4 7.2  --   --   --   --   BILITOT 3.6*  --  3.0* 2.9*  --   --   --   --   ALKPHOS 157*  --  153* 122*  --   --   --   --   ALT 15  --  13 14  --   --   --   --   AST 45*  --  43* 56*  --   --   --   --   GLUCOSE 107*  < > 104* 96  < > 126* 107* 116*  < > = values in this interval not displayed.  C.Diff (negative)  Urine Culture (E.Coli -  pan-sensitive)  Blood Cultures (12/18) MRSA+ Repeat Blood Cultures (12/20) NGTD  Imaging/Diagnostic Tests:  12/16 Knee Xray IMPRESSION:  No acute fracture or subluxation. Stable postsurgical changes. Mild  degenerative changes.  12/18 Hip Xray IMPRESSION:  Normal exam.  12/18 Head CT w/o contrast IMPRESSION:  Study within normal limits.  12/18 Chest Xray 1v FINDINGS:  There is no edema or consolidation. Heart is mildly enlarged with  normal pulmonary vascularity. No appreciable adenopathy. There is  persistent mild elevation the right hemidiaphragm.  IMPRESSION:  Cardiac enlargement, stable. No edema or consolidation.  12/20 Repeat Portable CXR IMPRESSION:  The findings are consistent with atelectasis or developing pneumonia  diffusely on the left and in the right upper lobe. This may reflect  the clinically suspected aspiration.   Saralyn Pilar, DO 07/22/2013, 2:34 PM PGY-1, [facility] Family Medicine FPTS Intern pager: [phone] Amion password: [email or web address], text pages welcome

## 2013-07-22 NOTE — Progress Notes (Signed)
INFECTIOUS DISEASE PROGRESS NOTE  ID: Colleen Olson is a 52 y.o. female with  Active Problems:   Altered mental state   Sepsis   Acute renal failure  Subjective: Awake and alert Abtx:  Anti-infectives   Start     Dose/Rate Route Frequency Ordered Stop   07/21/13 1230  vancomycin (VANCOCIN) 1,750 mg in sodium chloride 0.9 % 500 mL IVPB     1,750 mg 250 mL/hr over 120 Minutes Intravenous Every 24 hours 07/21/13 1231     07/21/13 1200  vancomycin (VANCOCIN) 1,750 mg in sodium chloride 0.9 % 500 mL IVPB  Status:  Discontinued     1,750 mg 250 mL/hr over 120 Minutes Intravenous Every 48 hours 07/19/13 1131 07/21/13 1231   07/20/13 0630  ampicillin (OMNIPEN) 2 g in sodium chloride 0.9 % 50 mL IVPB     2 g 150 mL/hr over 20 Minutes Intravenous 3 times per day 07/20/13 0624     07/19/13 1800  ampicillin (OMNIPEN) 2 g in sodium chloride 0.9 % 50 mL IVPB  Status:  Discontinued     2 g 150 mL/hr over 20 Minutes Intravenous Every 12 hours 07/19/13 1214 07/20/13 0616   07/19/13 1600  acyclovir (ZOVIRAX) 320 mg in dextrose 5 % 100 mL IVPB  Status:  Discontinued     320 mg 106.4 mL/hr over 60 Minutes Intravenous Every 24 hours 07/19/13 1214 07/20/13 0327   07/19/13 1400  cefTRIAXone (ROCEPHIN) 2 g in dextrose 5 % 50 mL IVPB  Status:  Discontinued     2 g 100 mL/hr over 30 Minutes Intravenous Every 12 hours 07/19/13 1214 07/20/13 1135   07/19/13 1200  vancomycin (VANCOCIN) 2,500 mg in sodium chloride 0.9 % 500 mL IVPB     2,500 mg 250 mL/hr over 120 Minutes Intravenous  Once 07/19/13 1131 07/19/13 1612   07/18/13 1615  cefTRIAXone (ROCEPHIN) 1 g in dextrose 5 % 50 mL IVPB  Status:  Discontinued     1 g 100 mL/hr over 30 Minutes Intravenous Every 24 hours 07/18/13 1603 07/18/13 1604      Medications:  Scheduled: . ampicillin (OMNIPEN) IV  2 g Intravenous Q8H  . calcium carbonate  1 tablet Oral Daily  . Chlorhexidine Gluconate Cloth  6 each Topical Q0600  . fluticasone  2 spray Each  Nare Daily  . lactulose  20 g Oral TID  . mupirocin ointment  1 application Nasal BID  . pantoprazole (PROTONIX) IV  40 mg Intravenous Q12H  . sodium chloride  3 mL Intravenous Q12H  . sodium polystyrene  30 g Per Tube Once  . vancomycin  1,750 mg Intravenous Q24H    Objective: Vital signs in last 24 hours: Temp:  [97.5 F (36.4 C)-98.5 F (36.9 C)] 98.3 F (36.8 C) (12/22 1321) Pulse Rate:  [88-105] 102 (12/22 1321) Resp:  [18-20] 18 (12/22 1321) BP: (114-134)/(53-70) 123/58 mmHg (12/22 1321) SpO2:  [95 %-100 %] 100 % (12/22 1321) Weight:  [140 kg (308 lb 10.3 oz)] 140 kg (308 lb 10.3 oz) (12/22 0500)   General appearance: alert, cooperative and no distress Eyes: EOMI, icteric Resp: clear to auscultation bilaterally Cardio: regular rate and rhythm GI: normal findings: bowel sounds normal and soft, non-tender and abnormal findings:  distended Neurologic: Mental status: alertness: alert, orientation: person, she misses place, date, president  Lab Results  Recent Labs  07/21/13 0425 07/22/13 1240 07/22/13 1510  WBC 9.9 11.5*  --   HGB 7.1* 8.4*  --  HCT 19.8* 23.3*  --   NA 140  --  136  K 3.9  --  3.7  CL 105  --  102  CO2 25  --  26  BUN 39*  --  24*  CREATININE 2.20*  --  0.89   Liver Panel  Recent Labs  07/20/13 0400  ALBUMIN 1.6*   Sedimentation Rate No results found for this basename: ESRSEDRATE,  in the last 72 hours C-Reactive Protein No results found for this basename: CRP,  in the last 72 hours  Microbiology: Recent Results (from the past 240 hour(s))  CULTURE, BLOOD (ROUTINE X 2)     Status: None   Collection Time    07/18/13  4:55 PM      Result Value Range Status   Specimen Description BLOOD ARM RIGHT   Final   Special Requests BOTTLES DRAWN AEROBIC AND ANAEROBIC 10CC   Final   Culture  Setup Time     Final   Value: 07/19/2013 02:04     Performed at [facility]   Culture     Final   Value: METHICILLIN RESISTANT STAPHYLOCOCCUS  AUREUS     Note: RIFAMPIN AND GENTAMICIN SHOULD NOT BE USED AS SINGLE DRUGS FOR TREATMENT OF STAPH INFECTIONS. CRITICAL RESULT CALLED TO, READ BACK BY AND VERIFIED WITH: [facility] MACAFERO 07/21/13 @ 9:11AM BY RUSCOE A.     Note: Gram Stain Report Called to,Read Back By and Verified With: ASHLEY LARSON 07/19/13 1030 BY SMITHERSJ     Performed at [facility]   Report Status 07/21/2013 FINAL   Final   Organism ID, Bacteria METHICILLIN RESISTANT STAPHYLOCOCCUS AUREUS   Final  CULTURE, BLOOD (ROUTINE X 2)     Status: None   Collection Time    07/18/13  5:05 PM      Result Value Range Status   Specimen Description BLOOD HAND LEFT   Final   Special Requests BOTTLES DRAWN AEROBIC ONLY 10CC   Final   Culture  Setup Time     Final   Value: 07/19/2013 02:03     Performed at [facility]   Culture     Final   Value: METHICILLIN RESISTANT STAPHYLOCOCCUS AUREUS     Note: SUSCEPTIBILITIES PERFORMED ON PREVIOUS CULTURE WITHIN THE LAST 5 DAYS.     Note: Gram Stain Report Called to,Read Back By and Verified With: JANET BEASLEY 07/19/13 1035 BY SMITHERSJ     Performed at [facility]   Report Status 07/21/2013 FINAL   Final  MRSA PCR SCREENING     Status: Abnormal   Collection Time    07/19/13  2:37 PM      Result Value Range Status   MRSA by PCR POSITIVE (*) NEGATIVE Final   Comment:            The GeneXpert MRSA Assay (FDA     approved for NASAL specimens     only), is one component of a     comprehensive MRSA colonization     surveillance program. It is not     intended to diagnose MRSA     infection nor to guide or     monitor treatment for     MRSA infections.     RESULT CALLED TO, READ BACK BY AND VERIFIED WITH:     C. SCHILLER RN 15:50 07/19/13 ([email or web address])  URINE CULTURE     Status: None   Collection Time    07/19/13  4:15 PM  Result Value Range Status   Specimen Description URINE, CATHETERIZED   Final   Special Requests NONE   Final   Culture  Setup Time      Final   Value: 07/19/2013 21:45     Performed at [facility] Count     Final   Value: 25,000 COLONIES/ML     Performed at [facility]   Culture     Final   Value: ESCHERICHIA COLI     Performed at [facility]   Report Status 07/21/2013 FINAL   Final   Organism ID, Bacteria ESCHERICHIA COLI   Final  CULTURE, BLOOD (ROUTINE X 2)     Status: None   Collection Time    07/20/13 12:20 PM      Result Value Range Status   Specimen Description BLOOD LEFT ARM   Final   Special Requests BOTTLES DRAWN AEROBIC ONLY 10CC   Final   Culture  Setup Time     Final   Value: 07/20/2013 20:21     Performed at [facility]   Culture     Final   Value:        BLOOD CULTURE RECEIVED NO GROWTH TO DATE CULTURE WILL BE HELD FOR 5 DAYS BEFORE ISSUING A FINAL NEGATIVE REPORT     Performed at [facility]   Report Status PENDING   Incomplete  CLOSTRIDIUM DIFFICILE BY PCR     Status: None   Collection Time    07/21/13  2:17 PM      Result Value Range Status   C difficile by pcr NEGATIVE  NEGATIVE Final    Studies/Results: No results found.   Assessment/Plan: MRSA bacteremia  Cirrhosis  ARF  Hepatitis C Ab+ but RNA (-) with cirrhosis  E coli UTI  encephalopathy  Would  Continue ampicillin (for UCx e coli), aim for 7 total days Continue vanco  Await repeat BCx, so far ngtd C diff (-) Cr improving Palliative care to eval, will most likely need long term placement.   Total days of antibiotics: 5 vanco 12/19 -->  ampicillin 12/19 -->  Ceftriaxone 12/19 -->12/20  ACV 12/19 ---12/20          [name] Infectious Diseases (pager) [phone] [email or web address] 07/22/2013, 6:51 PM  LOS: 4 days

## 2013-07-22 NOTE — Progress Notes (Signed)
FMTS Attending Note  I personally saw and evaluated the patient. The plan of care was discussed with the resident team. I agree with the assessment and plan as documented by the resident.   1. Liver Cirrhosis - agree with resident plan, will consult GI to help with prognosis 2. Bacertemia/Sepsis - improved, continue Vanco and Ampicillin, repeat blood cultures 3. UTI - improved on Ampicillin, continue foley 4. Encephalopathy - due to above, slowly improving with treatment 5. Acute on chronic anemia with FOB+; appreciate GI input, complicated by increased INR due to liver disease, monitor INR, may need FFP 6. AKI - improving, appreciate nephrology recs.   Donnella Sham MD

## 2013-07-23 ENCOUNTER — Inpatient Hospital Stay (HOSPITAL_COMMUNITY): Payer: Medicare Other

## 2013-07-23 ENCOUNTER — Ambulatory Visit: Payer: Medicare Other | Admitting: Family Medicine

## 2013-07-23 LAB — CBC
Hemoglobin: 7.4 g/dL — ABNORMAL LOW (ref 12.0–15.0)
MCH: 26.6 pg (ref 26.0–34.0)
MCHC: 35.4 g/dL (ref 30.0–36.0)
Platelets: 65 10*3/uL — ABNORMAL LOW (ref 150–400)
RDW: 16.9 % — ABNORMAL HIGH (ref 11.5–15.5)
WBC: 14.7 10*3/uL — ABNORMAL HIGH (ref 4.0–10.5)

## 2013-07-23 LAB — BASIC METABOLIC PANEL
Calcium: 8.6 mg/dL (ref 8.4–10.5)
Chloride: 106 mEq/L (ref 96–112)
GFR calc Af Amer: >90 mL/min (ref >90)
GFR calc non Af Amer: 81 mL/min — ABNORMAL LOW (ref >90)
Glucose, Bld: 119 mg/dL — ABNORMAL HIGH (ref 70–99)
Potassium: 4 mEq/L (ref 3.5–5.1)
Sodium: 139 mEq/L (ref 135–145)

## 2013-07-23 LAB — GLUCOSE, CAPILLARY
Glucose-Capillary: 109 mg/dL — ABNORMAL HIGH (ref 70–99)
Glucose-Capillary: 123 mg/dL — ABNORMAL HIGH (ref 70–99)
Glucose-Capillary: 134 mg/dL — ABNORMAL HIGH (ref 70–99)
Glucose-Capillary: 144 mg/dL — ABNORMAL HIGH (ref 70–99)

## 2013-07-23 LAB — AMMONIA: Ammonia: 53 umol/L (ref 11–60)

## 2013-07-23 MED ORDER — IOHEXOL 300 MG/ML  SOLN
25.0000 mL | INTRAMUSCULAR | Status: AC
  Administered 2013-07-23 (×2): 25 mL via ORAL

## 2013-07-23 MED ORDER — IOHEXOL 300 MG/ML  SOLN
100.0000 mL | Freq: Once | INTRAMUSCULAR | Status: AC | PRN
  Administered 2013-07-23: 100 mL via INTRAVENOUS

## 2013-07-23 MED ORDER — LACTULOSE 10 GM/15ML PO SOLN
20.0000 g | Freq: Three times a day (TID) | ORAL | Status: DC
  Administered 2013-07-23 – 2013-07-28 (×14): 20 g via ORAL
  Filled 2013-07-23 (×18): qty 30

## 2013-07-23 NOTE — Progress Notes (Signed)
INFECTIOUS DISEASE PROGRESS NOTE  ID: Colleen Olson is a 52 y.o. female with  Active Problems:   Altered mental state   Sepsis   Acute renal failure  Subjective: Awake and alert.   Abtx:  Anti-infectives   Start     Dose/Rate Route Frequency Ordered Stop   07/21/13 1230  vancomycin (VANCOCIN) 1,750 mg in sodium chloride 0.9 % 500 mL IVPB     1,750 mg 250 mL/hr over 120 Minutes Intravenous Every 24 hours 07/21/13 1231     07/21/13 1200  vancomycin (VANCOCIN) 1,750 mg in sodium chloride 0.9 % 500 mL IVPB  Status:  Discontinued     1,750 mg 250 mL/hr over 120 Minutes Intravenous Every 48 hours 07/19/13 1131 07/21/13 1231   07/20/13 0630  ampicillin (OMNIPEN) 2 g in sodium chloride 0.9 % 50 mL IVPB     2 g 150 mL/hr over 20 Minutes Intravenous 3 times per day 07/20/13 0624     07/19/13 1800  ampicillin (OMNIPEN) 2 g in sodium chloride 0.9 % 50 mL IVPB  Status:  Discontinued     2 g 150 mL/hr over 20 Minutes Intravenous Every 12 hours 07/19/13 1214 07/20/13 0616   07/19/13 1600  acyclovir (ZOVIRAX) 320 mg in dextrose 5 % 100 mL IVPB  Status:  Discontinued     320 mg 106.4 mL/hr over 60 Minutes Intravenous Every 24 hours 07/19/13 1214 07/20/13 0327   07/19/13 1400  cefTRIAXone (ROCEPHIN) 2 g in dextrose 5 % 50 mL IVPB  Status:  Discontinued     2 g 100 mL/hr over 30 Minutes Intravenous Every 12 hours 07/19/13 1214 07/20/13 1135   07/19/13 1200  vancomycin (VANCOCIN) 2,500 mg in sodium chloride 0.9 % 500 mL IVPB     2,500 mg 250 mL/hr over 120 Minutes Intravenous  Once 07/19/13 1131 07/19/13 1612   07/18/13 1615  cefTRIAXone (ROCEPHIN) 1 g in dextrose 5 % 50 mL IVPB  Status:  Discontinued     1 g 100 mL/hr over 30 Minutes Intravenous Every 24 hours 07/18/13 1603 07/18/13 1604      Medications:  Scheduled: . ampicillin (OMNIPEN) IV  2 g Intravenous Q8H  . calcium carbonate  1 tablet Oral Daily  . Chlorhexidine Gluconate Cloth  6 each Topical Q0600  . fluticasone  2 spray  Each Nare Daily  . iohexol  25 mL Oral Q1 Hr x 2  . lactulose  20 g Oral TID  . mupirocin ointment  1 application Nasal BID  . pantoprazole (PROTONIX) IV  40 mg Intravenous Q12H  . sodium chloride  3 mL Intravenous Q12H  . vancomycin  1,750 mg Intravenous Q24H    Objective: Vital signs in last 24 hours: Temp:  [98.1 F (36.7 C)-98.9 F (37.2 C)] 98.4 F (36.9 C) (12/23 1337) Pulse Rate:  [102-105] 102 (12/23 1337) Resp:  [18] 18 (12/23 1337) BP: (119-143)/(59-75) 143/75 mmHg (12/23 1337) SpO2:  [98 %-100 %] 100 % (12/23 1337) Weight:  [142.4 kg (313 lb 15 oz)] 142.4 kg (313 lb 15 oz) (12/23 0432)   General appearance: alert, cooperative and no distress Resp: clear to auscultation bilaterally Cardio: regular rate and rhythm GI: normal findings: bowel sounds normal and soft, non-tender Neurologic: Mental status: alertness: alert, orientation: she misses year and place.   Lab Results  Recent Labs  07/22/13 1240 07/22/13 1510 07/23/13 0710  WBC 11.5*  --  14.7*  HGB 8.4*  --  7.4*  HCT 23.3*  --  20.9*  NA  --  136 139  K  --  3.7 4.0  CL  --  102 106  CO2  --  26 28  BUN  --  24* 18  CREATININE  --  0.89 0.81   Liver Panel No results found for this basename: PROT, ALBUMIN, AST, ALT, ALKPHOS, BILITOT, BILIDIR, IBILI,  in the last 72 hours Sedimentation Rate No results found for this basename: ESRSEDRATE,  in the last 72 hours C-Reactive Protein No results found for this basename: CRP,  in the last 72 hours  Microbiology: Recent Results (from the past 240 hour(s))  CULTURE, BLOOD (ROUTINE X 2)     Status: None   Collection Time    07/18/13  4:55 PM      Result Value Range Status   Specimen Description BLOOD ARM RIGHT   Final   Special Requests BOTTLES DRAWN AEROBIC AND ANAEROBIC 10CC   Final   Culture  Setup Time     Final   Value: 07/19/2013 02:04     Performed at [facility]   Culture     Final   Value: METHICILLIN RESISTANT STAPHYLOCOCCUS AUREUS       Note: RIFAMPIN AND GENTAMICIN SHOULD NOT BE USED AS SINGLE DRUGS FOR TREATMENT OF STAPH INFECTIONS. CRITICAL RESULT CALLED TO, READ BACK BY AND VERIFIED WITH: [facility] MACAFERO 07/21/13 @ 9:11AM BY RUSCOE A.     Note: Gram Stain Report Called to,Read Back By and Verified With: ASHLEY LARSON 07/19/13 1030 BY SMITHERSJ     Performed at [facility]   Report Status 07/21/2013 FINAL   Final   Organism ID, Bacteria METHICILLIN RESISTANT STAPHYLOCOCCUS AUREUS   Final  CULTURE, BLOOD (ROUTINE X 2)     Status: None   Collection Time    07/18/13  5:05 PM      Result Value Range Status   Specimen Description BLOOD HAND LEFT   Final   Special Requests BOTTLES DRAWN AEROBIC ONLY 10CC   Final   Culture  Setup Time     Final   Value: 07/19/2013 02:03     Performed at [facility]   Culture     Final   Value: METHICILLIN RESISTANT STAPHYLOCOCCUS AUREUS     Note: SUSCEPTIBILITIES PERFORMED ON PREVIOUS CULTURE WITHIN THE LAST 5 DAYS.     Note: Gram Stain Report Called to,Read Back By and Verified With: JANET BEASLEY 07/19/13 1035 BY SMITHERSJ     Performed at [facility]   Report Status 07/21/2013 FINAL   Final  MRSA PCR SCREENING     Status: Abnormal   Collection Time    07/19/13  2:37 PM      Result Value Range Status   MRSA by PCR POSITIVE (*) NEGATIVE Final   Comment:            The GeneXpert MRSA Assay (FDA     approved for NASAL specimens     only), is one component of a     comprehensive MRSA colonization     surveillance program. It is not     intended to diagnose MRSA     infection nor to guide or     monitor treatment for     MRSA infections.     RESULT CALLED TO, READ BACK BY AND VERIFIED WITH:     C. SCHILLER RN 15:50 07/19/13 ([email or web address])  URINE CULTURE     Status: None   Collection Time  07/19/13  4:15 PM      Result Value Range Status   Specimen Description URINE, CATHETERIZED   Final   Special Requests NONE   Final   Culture  Setup Time     Final    Value: 07/19/2013 21:45     Performed at [facility] Count     Final   Value: 25,000 COLONIES/ML     Performed at [facility]   Culture     Final   Value: ESCHERICHIA COLI     Performed at [facility]   Report Status 07/21/2013 FINAL   Final   Organism ID, Bacteria ESCHERICHIA COLI   Final  CULTURE, BLOOD (ROUTINE X 2)     Status: None   Collection Time    07/20/13 12:20 PM      Result Value Range Status   Specimen Description BLOOD LEFT ARM   Final   Special Requests BOTTLES DRAWN AEROBIC ONLY 10CC   Final   Culture  Setup Time     Final   Value: 07/20/2013 20:21     Performed at [facility]   Culture     Final   Value:        BLOOD CULTURE RECEIVED NO GROWTH TO DATE CULTURE WILL BE HELD FOR 5 DAYS BEFORE ISSUING A FINAL NEGATIVE REPORT     Performed at [facility]   Report Status PENDING   Incomplete  CLOSTRIDIUM DIFFICILE BY PCR     Status: None   Collection Time    07/21/13  2:17 PM      Result Value Range Status   C difficile by pcr NEGATIVE  NEGATIVE Final    Studies/Results: No results found.   Assessment/Plan: MRSA bacteremia  Cirrhosis  ARF  Hepatitis C Ab+ but RNA (-) with cirrhosis  E coli UTI  encephalopathy  Total days of antibiotics: 6 vanco 12/19 -->  ampicillin 12/19 -->  Ceftriaxone 12/19 -->12/20  ACV 12/19 ---12/20   Would: Stop ampicillin tomorrow Plan for 2 weeks of vanco Palliative care eval, long term planning.  Available if questions         [name] Infectious Diseases (pager) [phone] [email or web address] 07/23/2013, 2:09 PM  LOS: 5 days

## 2013-07-23 NOTE — Progress Notes (Signed)
Family Medicine Teaching Service Daily Progress Note Intern Pager: [phone]  Patient name: Colleen Olson Medical record number: [id number] Date of birth: 1960/12/06 Age: 52 y.o. Gender: female  Primary Care Provider: Rodman Pickle, MD Consultants: Nephrology Code Status: Full  Pt Overview and Major Events to Date:  07/18/13 - pt admitted with AMS, severe AKI 07/19/13 - blood cx grow GPC start anti-bx, consult renal, plan to attempt LP 12/20 --> 12/21 - CCM Primary 12/22 - Improved mental status, Hgb 7.1--8.4 12/23 - Hgb >> 7.4; not a candidate for GI intervention; Palliative care consulted  Assessment and Plan: Colleen Olson is a 52 y.o. female presenting with AMS and acute changes in functional status x 4 days, ultimately found to have sepsis from bacteremia (MRSA), Acute Renal Failure, and UTI (E.Coli). PMH is significant for Cirrhosis 2/2 NASH and ETOH abuse (pt DOES NOT HAVE HEP C AS RNA NEG), Asthma, HTN, Borderline DM, HA, Arthritis, Chronic back pain  ID: # Sepsis, with MRSA Bacteremia with worsening leukocytosis # UTI, E.Coli Initially unclear etiology, considered GI source (US revealed minimal ascites), considered pulmonary etiology with suspected atelectasis vs consolidation. No obvious skin lesions or ulcers. Previously received empiric therapy for possible meningitis, LP unable to be obtained. Confirmed UTI with E.Coli, however not source of bacteremia. - (12/18) Blood culture - positive MRSA - c/s ID, greatly appreciate all recs    - Continue Vancomycin (Day 4)    - repeat blood cultures (12/20) - NGTD    - has remained afebrile but continues to have worsening leukocytosis    - continue Ampicillin (Day 3) for E.Coli UTI (25,000CFU)  Neuro: # Acute Encephalopathy, in setting of cirrhosis, suspected secondary sepsis with bacteremia / ARF Unclear mental status at baseline, concern for hepatic encephalopathy but ammonia initially normal at 52 on admission, has since  increased to (91, on 12/19). Initial work-up negative with Head CT, considered meningitis however since determined less likely. Overall suspected primary etiology from bacteremia / acute renal failure. - Interactive today but not oriented X4. - ammonia 53 12/23; continue lactulose; appreciate if palliative to address appropriateness to continue (rectal tube in place)  GI: # Cirrhosis, secondary to chronic alcohol use (continued) On admission, reportedly saw Hepatologist about 3 weeks ago. No recent ingestions per daughter. Ammonia 52 in ED, and initially scleral icterus noted on exam. Pt on lasix and spirnolactone at home (per report took all her medications this morning). No evidence of bruising or purpura on admission. UDS (+opiates only).   - continue to hold spironolactone / lasix, nephrotoxic meds - trend LFTs, Coags    - AST 45-->56  / ALT 15-->14    - Albumin 1.6    - Ammonia 52-->91    - T.Bili 2.9    - increasing INR 1.99-->2.22 (suggestive of hepatic synthetic fxn failure) - lactulose BID, titrate to BMs, strict i/os and daily weights - Patient is not a liver transplant candidate due to continued alcohol use [ ]  reimage liver given worsening WBC and prior ?hepatoma with potential concerns for Liver abscess  Heme: # Acute on Chronic Anemia, Microcytic, concern for acute blood loss anemia 2/2 UGI Bleed On admission, reported significant event of bloody production from mouth / nose. Worsening of chronic microcytic anemia, positive FOBT in Oct 2014 and likely to have GI bleed now in setting of increasing INR > 2.0. FOBT (positive 07/19/13) Currently no active signs of bleeding, no hematemesis, no hematochezia - b/l Hgb 9 - 10 - decreasing Hgb trend  8.1-->7.6-->7.5-->7.1-->8.4 -- > 7.4 isolated drop - consider transfusion threshold at < 7.0 - initiate Protonix 40mg  IV BID - Concern for potential decompensation if persistent GI bleed, considered FFP transfusion, continue to monitor, low  threshold for returning to SDU status - GI reports not a candidate for intervention and recommend palliative care consult  Renal: # AKI, suspected pre-renal etiology - Improved 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  H/o baseline Cr 0.7, with significant increase to 5.0 on admission - improving Cr trend 4.34-->3.85-->3.52-->2.20 --> 0.89 --> 0.81 - Good UOP 2150 (24 hrs) - continue to hold spironolactone / lasix, nephrotoxic meds C/s Nephrology, greatly appreciate all recommendations   - stopped IVF bicarb / albumin. initially treated with IVF isotonic bicarb, Albumin --> improved BP and UOP   - Would not offer HD (no impact on survival, if not a transplant candidate)   - continue to trend  Metabolic: # Hyperkalemia - Resolved - K+ peak at 6.4 --> improving trend to 3.9 - previously NGT placed d/t AMS and NPO --> kayexalate, insulin/D50/bicarb. No documented EKG changes # Hyponatremia - Resolved # Metabolic Acidosis - Resolved - initially received bicarb gtt, since stopped with normalized metabolic disturbances  Resp: # Asthma on O2- On 2LNC at home Daughter had noticed pt with increased WOB lately and some gasping sensation. No positional relationship. Limited resp exam today -cont albuterol prn  -home o2 requirement  -pulse ox continuous  -monitor vs  -loratidine, flonase prn   # Right knee pain- s/p TKR. No acute exacerbating factors. Pt daughter report has not ambulated since surgery.  - Hold chronic pain meds given AMS  FEN/GI: NPO, cont NS at 125 ml/hr PPx: INR 2, no anticoagulant  Disposition: Patient admitted to floor, transferred to SDU and then to the ICU, managed by PCCM 12/20 to 12/21, transferred back to floor status. Overall continued work-up and medical stabilization to determine outcome and future plan in patient with poor long-term prognosis.  Palliative Care consulted for worsening hepatic encephalopathy and  GOC  Subjective: Pt is awake and interactive but not coherent.  Speech is clear but nonsensical; denies pain  Objective: Temp:  [97.9 F (36.6 C)-98.9 F (37.2 C)] 98.1 F (36.7 C) (12/23 0931) Pulse Rate:  [96-105] 104 (12/23 0931) Resp:  [18-20] 18 (12/23 0931) BP: (114-134)/(58-72) 129/64 mmHg (12/23 0931) SpO2:  [95 %-100 %] 99 % (12/23 0931) Weight:  [313 lb 15 oz (142.4 kg)] 313 lb 15 oz (142.4 kg) (12/23 0432) Physical Exam: General: obese, altered mentation but fully awake and alert, tangential speech / conversation, NAD HEENT: NCAT, PERRL, mild scleral icterus, MMM Cardiovascular: RRR, no murmurs heard Respiratory: CTAB Abdomen: obese, soft, NTND, +active BS Extremities: WWP, b/l LE +1-2 pitting edema, with evidence of TKR on left Skin: no evidence of bruising or bleeding Neuro: awake, and alert, not oriented X4.   Laboratory:  Recent Labs Lab 07/21/13 0425 07/22/13 1240 07/23/13 0710  WBC 9.9 11.5* 14.7*  HGB 7.1* 8.4* 7.4*  HCT 19.8* 23.3* 20.9*  PLT 81* 68* 65*    Recent Labs Lab 07/18/13 1201  07/19/13 0245 07/19/13 1636  07/21/13 0425 07/22/13 1510 07/23/13 0710  NA 131*  < > 134* 132*  < > 140 136 139  K 6.8*  < > 6.0* 6.4*  < > 3.9 3.7 4.0  CL 102  < > 104 103  < > 105 102 106  CO2 21  < > 20 20  < > 25 26  28  BUN 31*  < > 36* 39*  < > 39* 24* 18  CREATININE 4.98*  < > 5.13* 4.53*  < > 2.20* 0.89 0.81  CALCIUM 8.3*  < > 7.9* 7.4*  < > 8.1* 8.8 8.6  PROT 8.1  --  7.4 7.2  --   --   --   --   BILITOT 3.6*  --  3.0* 2.9*  --   --   --   --   ALKPHOS 157*  --  153* 122*  --   --   --   --   ALT 15  --  13 14  --   --   --   --   AST 45*  --  43* 56*  --   --   --   --   GLUCOSE 107*  < > 104* 96  < > 116* 145* 119*  < > = values in this interval not displayed.  C.Diff (negative)  Urine Culture (E.Coli 25,000CFU - pan-sensitive)  Blood Cultures (12/18) MRSA+ Repeat Blood Cultures (12/20) NGTD  Imaging/Diagnostic Tests:  12/16 Knee  Xray IMPRESSION:  No acute fracture or subluxation. Stable postsurgical changes. Mild  degenerative changes.  12/18 Hip Xray IMPRESSION:  Normal exam.  12/18 Head CT w/o contrast IMPRESSION:  Study within normal limits.  12/18 Chest Xray 1v FINDINGS:  There is no edema or consolidation. Heart is mildly enlarged with  normal pulmonary vascularity. No appreciable adenopathy. There is  persistent mild elevation the right hemidiaphragm.  IMPRESSION:  Cardiac enlargement, stable. No edema or consolidation.  12/20 Repeat Portable CXR IMPRESSION:  The findings are consistent with atelectasis or developing pneumonia  diffusely on the left and in the right upper lobe. This may reflect  the clinically suspected aspiration.   Andrena Mews, DO  07/23/2013, 10:03 AM PGY-3, [facility] FPTS Intern pager: [phone] Amion password: [email or web address], text pages welcome

## 2013-07-23 NOTE — Progress Notes (Signed)
FMTS Attending Daily Note: Kyl Givler MD [phone] pager office [phone] I  have seen and examined this patient, reviewed their chart. I have discussed this patient with the resident. I agree with the resident's findings, assessment and care plan. 

## 2013-07-23 NOTE — Progress Notes (Signed)
NUTRITION FOLLOW UP  INTERVENTION:  Continue Resource Breeze po BID, each supplement provides 250 kcal and 9 grams of protein RD to follow for nutrition care plan  NUTRITION DIAGNOSIS: Inadequate oral intake related to AMS as evidenced by minimal PO intake, ongoing  Goal: Intake to meet >90% of estimated nutrition needs, progressing  Monitor:  PO & supplemental intake, goals of care, weight, labs, I/O's  ASSESSMENT: PMHx significant for cirrhosis 2/2 Hep C and ETOH abuse, asthma, HTN, borderline DM, HA, arthritis, chronic back pain. Admitted with AMS x 4 days. Work-up ongoing.  Patient s/p bedside swallow evaluation 12/19.  S/p lumbar puncture 12/19.  Only 1 meal recorded  -- 100% at dinner last night.  + MRSA bacteremia.  Noted very poor prognosis.  Palliative Care consulted for worsening hepatic encephalopathy and GOC.  Receiving Resource Breeze supplements twice daily.  Height: Ht Readings from Last 1 Encounters:  07/19/13 5\' 8"  (1.727 m)    Wt Readings from Last 1 Encounters:  07/23/13 313 lb 15 oz (142.4 kg)    Estimated Nutritional Needs: Kcal: 1600 - 1800 Protein: 70 - 85 g Fluid: 1.6 - 1.8 liters  Skin: Intact  Diet Order: Full Liquids   Intake/Output Summary (Last 24 hours) at 07/23/13 1240 Last data filed at 07/23/13 0433  Gross per 24 hour  Intake   1090 ml  Output   2125 ml  Net  -1035 ml    Labs:   Recent Labs Lab 07/20/13 0400  07/21/13 0425 07/22/13 1510 07/23/13 0710  NA 133*  < > 140 136 139  K 5.5*  < > 3.9 3.7 4.0  CL 104  < > 105 102 106  CO2 18*  < > 25 26 28   BUN 41*  < > 39* 24* 18  CREATININE 3.85*  < > 2.20* 0.89 0.81  CALCIUM 7.6*  < > 8.1* 8.8 8.6  MG  --   --  1.6  --   --   PHOS 6.0*  --  3.9  --   --   GLUCOSE 126*  < > 116* 145* 119*  < > = values in this interval not displayed.  CBG (last 3)   Recent Labs  07/23/13 0406 07/23/13 0753 07/23/13 1156  GLUCAP 123* 124* 109*    Scheduled Meds: . ampicillin  (OMNIPEN) IV  2 g Intravenous Q8H  . calcium carbonate  1 tablet Oral Daily  . Chlorhexidine Gluconate Cloth  6 each Topical Q0600  . fluticasone  2 spray Each Nare Daily  . iohexol  25 mL Oral Q1 Hr x 2  . mupirocin ointment  1 application Nasal BID  . pantoprazole (PROTONIX) IV  40 mg Intravenous Q12H  . sodium chloride  3 mL Intravenous Q12H  . vancomycin  1,750 mg Intravenous Q24H    Continuous Infusions:    Past Medical History  Diagnosis Date  . Asthma   . Hypertension   . Angina   . Osteoarthritis of both knees   . Borderline diabetes   . Headache(784.0) 12/02/11    "sometimes when I wake up in am; real bad"  . Arthritis   . Chronic lower back pain   . Tachycardia   . Cirrhosis     [name] 07/18/2013  . High cholesterol   . Exertional dyspnea 12/01/11  . Shortness of breath     "all the time right now" (07/18/2013)  . On home oxygen therapy     "2L; 24/7" (07/18/2013)  .  Type II diabetes mellitus     "not taking RX anymore" (07/18/2013)  . Anxiety   . Depression   . Acute kidney failure 07/18/2013  . Altered mental status     admitting dx 07/18/2013    Past Surgical History  Procedure Laterality Date  . Joint replacement    . Total knee arthroplasty Left 03/02/2011  . Tonsillectomy      "I was a little girl"  . Tubal ligation  1982  . Knee arthroscopy Left 2012    Maureen Chatters, RD, LDN Pager #: [phone] After-Hours Pager #: [phone]

## 2013-07-23 NOTE — Progress Notes (Signed)
Patient ID: Colleen Olson, female   DOB: 1960-10-21, 52 y.o.   MRN: [id number] S:awake but still seeming a little confused.  Good uop, AKI resolved !!! O:BP 143/75  Pulse 102  Temp(Src) 98.4 F (36.9 C) (Oral)  Resp 18  Ht 5\' 8"  (1.727 m)  Wt 142.4 kg (313 lb 15 oz)  BMI 47.74 kg/m2  SpO2 100%  Intake/Output Summary (Last 24 hours) at 07/23/13 1455 Last data filed at 07/23/13 1338  Gross per 24 hour  Intake   1090 ml  Output   3125 ml  Net  -2035 ml   Intake/Output: I/O last 3 completed shifts: In: 1450 [P.O.:1400; IV Piggyback:50] Out: 3375 [Urine:3375]  Intake/Output this shift:  Total I/O In: -  Out: 1000 [Urine:1000] Weight change: 4.3 kg (9 lb 7.7 oz) Gen:WD obese AAF lying in bed, awake but disoriented/delirious CVS:no rub Resp:decreased BS at bases QVZ:DGLOV, +BS Ext:1+ pitting pretib edema   Recent Labs Lab 07/18/13 1201  07/19/13 0245 07/19/13 1636 07/19/13 2138 07/20/13 0400 07/20/13 1010 07/21/13 0425 07/22/13 1510 07/23/13 0710  NA 131*  < > 134* 132* 133* 133* 135 140 136 139  K 6.8*  < > 6.0* 6.4* 5.4* 5.5* 5.2* 3.9 3.7 4.0  CL 102  < > 104 103 104 104 104 105 102 106  CO2 21  < > 20 20 21  18* 20 25 26 28   GLUCOSE 107*  < > 104* 96 91 126* 107* 116* 145* 119*  BUN 31*  < > 36* 39* 42* 41* 42* 39* 24* 18  CREATININE 4.98*  < > 5.13* 4.53* 4.34* 3.85* 3.52* 2.20* 0.89 0.81  ALBUMIN 1.6*  --  1.3* 1.2*  --  1.6*  --   --   --   --   CALCIUM 8.3*  < > 7.9* 7.4* 7.2* 7.6* 7.6* 8.1* 8.8 8.6  PHOS  --   --   --   --   --  6.0*  --  3.9  --   --   AST 45*  --  43* 56*  --   --   --   --   --   --   ALT 15  --  13 14  --   --   --   --   --   --   < > = values in this interval not displayed. Liver Function Tests:  Recent Labs Lab 07/18/13 1201 07/19/13 0245 07/19/13 1636 07/20/13 0400  AST 45* 43* 56*  --   ALT 15 13 14   --   ALKPHOS 157* 153* 122*  --   BILITOT 3.6* 3.0* 2.9*  --   PROT 8.1 7.4 7.2  --   ALBUMIN 1.6* 1.3* 1.2* 1.6*   No  results found for this basename: LIPASE, AMYLASE,  in the last 168 hours  Recent Labs Lab 07/18/13 1201 07/19/13 1636 07/23/13 0905  AMMONIA 52 91* 53   CBC:  Recent Labs Lab 07/19/13 1636 07/20/13 0400 07/21/13 0425 07/22/13 1240 07/23/13 0710  WBC 8.4 9.0 9.9 11.5* 14.7*  HGB 7.6* 7.5* 7.1* 8.4* 7.4*  HCT 21.3* 20.5* 19.8* 23.3* 20.9*  MCV 75.3* 75.1* 74.4* 76.1* 75.2*  PLT 120* 86* 81* 68* 65*   Cardiac Enzymes:  Recent Labs Lab 07/19/13 1636 07/19/13 2138 07/20/13 0400 07/20/13 0955 07/21/13 0425  TROPONINI <0.30 <0.30 <0.30 <0.30 <0.30   CBG:  Recent Labs Lab 07/22/13 1943 07/22/13 2341 07/23/13 0406 07/23/13 0753 07/23/13 1156  GLUCAP 166* 137*  123* 124* 109*    Iron Studies: No results found for this basename: IRON, TIBC, TRANSFERRIN, FERRITIN,  in the last 72 hours Studies/Results: No results found. Marland Kitchen ampicillin (OMNIPEN) IV  2 g Intravenous Q8H  . calcium carbonate  1 tablet Oral Daily  . Chlorhexidine Gluconate Cloth  6 each Topical Q0600  . fluticasone  2 spray Each Nare Daily  . lactulose  20 g Oral TID  . mupirocin ointment  1 application Nasal BID  . pantoprazole (PROTONIX) IV  40 mg Intravenous Q12H  . sodium chloride  3 mL Intravenous Q12H  . vancomycin  1,750 mg Intravenous Q24H    BMET    Component Value Date/Time   NA 139 07/23/2013 0710   K 4.0 07/23/2013 0710   CL 106 07/23/2013 0710   CO2 28 07/23/2013 0710   GLUCOSE 119* 07/23/2013 0710   BUN 18 07/23/2013 0710   CREATININE 0.81 07/23/2013 0710   CREATININE 1.38* 07/01/2013 1515   CALCIUM 8.6 07/23/2013 0710   GFRNONAA 81* 07/23/2013 0710   GFRAA >90 07/23/2013 0710   CBC    Component Value Date/Time   WBC 14.7* 07/23/2013 0710   RBC 2.78* 07/23/2013 0710   RBC 3.28* 04/07/2013 2025   HGB 7.4* 07/23/2013 0710   HCT 20.9* 07/23/2013 0710   PLT 65* 07/23/2013 0710   MCV 75.2* 07/23/2013 0710   MCH 26.6 07/23/2013 0710   MCHC 35.4 07/23/2013 0710   RDW 16.9*  07/23/2013 0710   LYMPHSABS 2.2 04/07/2013 1542   MONOABS 0.7 04/07/2013 1542   EOSABS 0.1 04/07/2013 1542   BASOSABS 0.1 04/07/2013 1542     Assessment/Plan:  1. AKI- resolved. Palliative care consult could still be helpful to help set goals/limits of care as if she is in HRS, prognosis is grim without transplantation even though her current clinical situation is improving. 2. Hyperkalemia- resolved 3. Hyponatremia- resolved  4. MRSA bacteremia- per primary svc on Vanco, ID following. Also on ampicillin  for e coli uti 5. Metabolic acidosis- resolved. Stp bicarb 6. Cirrhosis- elevated INR and LFT's, per primary svc 7. AMS- as above. She has improved with albumin and better BP. ?meningitis/SIRS/HRS/hepatic encephelopathy (NH3 elevated to 91).  8. Hypoxia on ABG- as above consult PCCM 9. Hypotension/SIRS- hemodynamically improving.   10. Dispo- palliative care consult recommended  as above  Renal will sign off, call with any questions   Dimitris Shanahan A

## 2013-07-23 NOTE — Progress Notes (Signed)
Thank you for consulting the Palliative Medicine Team at [facility] to meet your patient's and family's needs.   The reason that you asked [place] to see your patient is for clarification of goals of care and options.  We have scheduled your patient for a meeting: 07/24/13 1200noon  The Surrogate decision make is: Lamount Cranker Contact information: [phone]  Other family members that need to be present: none   Your patient is able/unable to participate: Likely unable.  Yong Channel, NP Palliative Medicine Team Team Phone # [phone]

## 2013-07-24 DIAGNOSIS — Z515 Encounter for palliative care: Secondary | ICD-10-CM

## 2013-07-24 DIAGNOSIS — R531 Weakness: Secondary | ICD-10-CM

## 2013-07-24 DIAGNOSIS — R5381 Other malaise: Secondary | ICD-10-CM

## 2013-07-24 LAB — CBC
HCT: 21 % — ABNORMAL LOW (ref 36.0–46.0)
Hemoglobin: 7.5 g/dL — ABNORMAL LOW (ref 12.0–15.0)
MCH: 26.9 pg (ref 26.0–34.0)
MCHC: 35.7 g/dL (ref 30.0–36.0)
Platelets: 60 10*3/uL — ABNORMAL LOW (ref 150–400)
RBC: 2.79 MIL/uL — ABNORMAL LOW (ref 3.87–5.11)

## 2013-07-24 LAB — HEPATIC FUNCTION PANEL
ALT: 47 U/L — ABNORMAL HIGH (ref 0–35)
AST: 123 U/L — ABNORMAL HIGH (ref 0–37)
Albumin: 2 g/dL — ABNORMAL LOW (ref 3.5–5.2)
Alkaline Phosphatase: 157 U/L — ABNORMAL HIGH (ref 39–117)
Total Bilirubin: 4.5 mg/dL — ABNORMAL HIGH (ref 0.3–1.2)

## 2013-07-24 LAB — BASIC METABOLIC PANEL
BUN: 14 mg/dL (ref 6–23)
CO2: 25 mEq/L (ref 19–32)
Calcium: 8.3 mg/dL — ABNORMAL LOW (ref 8.4–10.5)
GFR calc non Af Amer: >90 mL/min (ref >90)
Glucose, Bld: 97 mg/dL (ref 70–99)
Potassium: 3.7 mEq/L (ref 3.5–5.1)
Sodium: 136 mEq/L (ref 135–145)

## 2013-07-24 LAB — VANCOMYCIN, TROUGH: Vancomycin Tr: 7.5 ug/mL — ABNORMAL LOW (ref 10.0–20.0)

## 2013-07-24 MED ORDER — VANCOMYCIN HCL 10 G IV SOLR
1500.0000 mg | Freq: Two times a day (BID) | INTRAVENOUS | Status: DC
  Administered 2013-07-25 – 2013-07-31 (×13): 1500 mg via INTRAVENOUS
  Filled 2013-07-24 (×14): qty 1500

## 2013-07-24 MED ORDER — VANCOMYCIN HCL 10 G IV SOLR
2000.0000 mg | Freq: Once | INTRAVENOUS | Status: AC
  Administered 2013-07-24: 2000 mg via INTRAVENOUS
  Filled 2013-07-24: qty 2000

## 2013-07-24 MED ORDER — TRAZODONE 25 MG HALF TABLET
25.0000 mg | ORAL_TABLET | Freq: Once | ORAL | Status: AC
  Administered 2013-07-24: 25 mg via ORAL
  Filled 2013-07-24: qty 1

## 2013-07-24 NOTE — Progress Notes (Signed)
Family Medicine Teaching Service Daily Progress Note Intern Pager: [phone]  Patient name: Colleen Olson Medical record number: [id number] Date of birth: 1960-09-17 Age: 52 y.o. Gender: female  Primary Care Provider: Rodman Pickle, MD Consultants: Nephrology Code Status: Full  Pt Overview and Major Events to Date:  07/18/13 - pt admitted with AMS, severe AKI 07/19/13 - blood cx grow GPC start anti-bx, consult renal, plan to attempt LP 12/20 --> 12/21 - CCM Primary 12/22 - Improved mental status, Hgb 7.1--8.4 12/23 - Hgb >> 7.4; not a candidate for GI intervention; Palliative care consulted  Assessment and Plan: Colleen Olson is a 52 y.o. female presenting with AMS and acute changes in functional status x 4 days, ultimately found to have sepsis from bacteremia (MRSA), Acute Renal Failure, and UTI (E.Coli). PMH is significant for Cirrhosis 2/2 NASH and ETOH abuse (pt DOES NOT HAVE HEP C AS RNA NEG), Asthma, HTN, Borderline DM, HA, Arthritis, Chronic back pain  ID: # Sepsis, with MRSA Bacteremia with continued worsening leukocytosis # UTI, E.Coli Initially unclear etiology, considered GI source (US revealed minimal ascites), considered pulmonary etiology with suspected atelectasis vs consolidation. No obvious skin lesions or ulcers. Previously received empiric therapy for possible meningitis, LP unable to be obtained. Confirmed UTI with E.Coli, however not source of bacteremia. - (12/18) Blood culture - positive MRSA - c/s ID, greatly appreciate all recs    - Continue Vancomycin (Day 5)    - repeat blood cultures (12/20) - NGTD    - No vital sign instability, lactic acid 06-23-13 normal    - Continues to be afebrile with worsening leukocytosis     - CT abdomen with possible pancreatitis, further labs pending     - continue Ampicillin (Day 5) for E.Coli UTI (25,000CFU) - last day today  Neuro: # Acute Encephalopathy, in setting of cirrhosis, suspected secondary sepsis with  bacteremia / ARF Unclear mental status at baseline, concern for hepatic encephalopathy but ammonia labile (52 >>91 >>52 >> .Marland KitchenMarland Kitchen)  Initial work-up negative with Head CT, considered meningitis however since determined less likely. Overall suspected primary etiology from bacteremia / acute renal failure. - Interactive today but not oriented X4. - continue lactulose; appreciate if palliative to address appropriateness to continue (rectal tube in place)  GI: # Cirrhosis, secondary to chronic alcohol use (continued); acute pancreatitis On admission, reportedly saw Hepatologist about 3 weeks ago. No recent ingestions per daughter. Ammonia 52 in ED, and initially scleral icterus noted on exam. Pt on lasix and spirnolactone at home (per report took all her medications this morning). No evidence of bruising or purpura on admission. UDS (+opiates only).   - continue to hold spironolactone / lasix, nephrotoxic meds  - trend LFTs, Coags    - Lipase elevated to 218 on 12/24; transaminitis worsening as well.,    - Albumin 1.6    - Ammonia 52-->91    - T.Bili 2.9 >> 4.5    - increasing INR 1.99-->2.22 (suggestive of hepatic synthetic fxn failure) - lactulose BID, titrate to BMs, strict i/os and daily weights - Patient is not a liver transplant candidate due to continued alcohol use - CT of the abdomen on 12/23 negative for acute abscess however concerning for possible pancreatitis  Heme: # Acute on Chronic Anemia, Microcytic, concern for acute blood loss anemia 2/2 UGI Bleed On admission, reported significant event of bloody production from mouth / nose. Worsening of chronic microcytic anemia, positive FOBT in Oct 2014 and likely to have upper GI bleed now  in setting of increasing INR > 2.0. FOBT (positive 07/19/13) Currently no active signs of bleeding, no hematemesis, no hematochezia - b/l Hgb 9 - 10 - decreasing Hgb trend 8.1-->7.6-->7.5-->7.1-->8.4 -- > 7.4 --> 7.5 - consider transfusion threshold at <  7.0 - Continue Protonix 40mg  IV BID - Concern for potential decompensation if persistent GI bleed, considered FFP transfusion, continue to monitor, low threshold for returning to SDU status - GI reports not a candidate for intervention and recommend palliative care consult  Renal: # AKI, suspected pre-renal etiology - Improved 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  H/o baseline Cr 0.7, with significant increase to 5.0 on admission - improving Cr trend 4.34-->3.85-->3.52-->2.20 --> 0.89 --> 0.81 --> 0.64 - Good UOP  - continue to hold spironolactone / lasix, nephrotoxic meds C/s Nephrology, greatly appreciate all recommendations   - stopped IVF bicarb / albumin. initially treated with IVF isotonic bicarb, Albumin --> improved BP and UOP   - Would not offer HD (no impact on survival, if not a transplant candidate)   - continue to trend  Metabolic: # Hyperkalemia - Resolved - K+ peak at 6.4 --> improving trend to 3.7 # Hyponatremia - Resolved # Metabolic Acidosis - Resolved - initially received bicarb gtt, since stopped with normalized metabolic disturbances  Resp: # Asthma on O2- On 2LNC at home Daughter had noticed pt with increased WOB lately and some gasping sensation. No positional relationship. Limited resp exam today -cont albuterol prn  -home o2 requirement  -pulse ox continuous  -monitor vs  -loratidine, flonase prn   # Right knee pain- s/p TKR. No acute exacerbating factors. Pt daughter report has not ambulated since surgery.  - Hold chronic pain meds given AMS  FEN/GI: NPO, cont NS at 125 ml/hr PPx: INR 2, no anticoagulant  Disposition: Patient admitted to floor, transferred to SDU and then to the ICU, managed by PCCM 12/20 to 12/21, transferred back to floor status. Overall continued work-up and medical stabilization to determine outcome and future plan in patient with poor long-term prognosis.  Palliative Care consulted for  worsening hepatic encephalopathy and GOC set for 12/24 at noon.  Subjective: Pt is awake and interactive but not coherent.  Maybe slightly better from yesterday but marginally Speech is clear but nonsensical; denies pain  Objective: Temp:  [97.8 F (36.6 C)-99 F (37.2 C)] 97.8 F (36.6 C) (12/24 0602) Pulse Rate:  [102-115] 107 (12/24 0602) Resp:  [18] 18 (12/24 0602) BP: (111-143)/(47-75) 138/65 mmHg (12/24 0602) SpO2:  [96 %-100 %] 96 % (12/24 0602) Weight:  [319 lb 10.7 oz (145 kg)] 319 lb 10.7 oz (145 kg) (12/24 0602) Physical Exam: General: obese, altered mentation but fully awake and alert, tangential speech / conversation, NAD HEENT: NCAT, PERRL, mild scleral icterus, MMM Cardiovascular: RRR, no murmurs heard Respiratory: Slightly coarse breath sounds without wheezing or crackles do Abdomen: obese, soft, NTND, +active BS Extremities: WWP, b/l LE +1-2 pitting edema, with evidence of TKR on left Skin: no evidence of bruising or bleeding Neuro: awake, and alert, not oriented X4.   Laboratory:  Recent Labs Lab 07/22/13 1240 07/23/13 0710 07/24/13 0523  WBC 11.5* 14.7* 20.0*  HGB 8.4* 7.4* 7.5*  HCT 23.3* 20.9* 21.0*  PLT 68* 65* 60*    Recent Labs Lab 07/19/13 0245 07/19/13 1636  07/22/13 1510 07/23/13 0710 07/24/13 0523  NA 134* 132*  < > 136 139 136  K 6.0* 6.4*  < > 3.7 4.0 3.7  CL 104  103  < > 102 106 104  CO2 20 20  < > 26 28 25   BUN 36* 39*  < > 24* 18 14  CREATININE 5.13* 4.53*  < > 0.89 0.81 0.64  CALCIUM 7.9* 7.4*  < > 8.8 8.6 8.3*  PROT 7.4 7.2  --   --   --  6.5  BILITOT 3.0* 2.9*  --   --   --  4.5*  ALKPHOS 153* 122*  --   --   --  157*  ALT 13 14  --   --   --  47*  AST 43* 56*  --   --   --  123*  GLUCOSE 104* 96  < > 145* 119* 97  < > = values in this interval not displayed.  C.Diff (negative) Urine Culture (E.Coli 25,000CFU - pan-sensitive)  Blood Cultures (12/18) MRSA+ Repeat Blood Cultures (12/20) NGTD  Imaging/Diagnostic  Tests:  12/16 Knee Xray IMPRESSION:  No acute fracture or subluxation. Stable postsurgical changes. Mild  degenerative changes.  12/18 Hip Xray IMPRESSION:  Normal exam.  12/18 Head CT w/o contrast IMPRESSION:  Study within normal limits.  12/18 Chest Xray 1v FINDINGS:  There is no edema or consolidation. Heart is mildly enlarged with  normal pulmonary vascularity. No appreciable adenopathy. There is  persistent mild elevation the right hemidiaphragm.  IMPRESSION:  Cardiac enlargement, stable. No edema or consolidation.  12/20 Repeat Portable CXR IMPRESSION:  The findings are consistent with atelectasis or developing pneumonia  diffusely on the left and in the right upper lobe. This may reflect  the clinically suspected aspiration.   Andrena Mews, DO 07/24/2013, 10:18 AM PGY-3, [facility] FPTS Intern pager: [phone] Amion password: [email or web address], text pages welcome

## 2013-07-24 NOTE — Progress Notes (Signed)
ANTIBIOTIC CONSULT NOTE - Follow-Up  Pharmacy Consult for Vancomycin Indication: MRSA bacteremia  No Known Allergies  Patient Measurements: Height: 5\' 8"  (172.7 cm) Weight: 319 lb 10.7 oz (145 kg) IBW/kg (Calculated) : 63.9  Vital Signs: Temp: 99 F (37.2 C) (12/24 1331) Temp src: Oral (12/24 1331) BP: 132/59 mmHg (12/24 1331) Pulse Rate: 112 (12/24 1331) Intake/Output from previous day: 12/23 0701 - 12/24 0700 In: -  Out: 2350 [Urine:2350] Intake/Output from this shift: Total I/O In: -  Out: 900 [Urine:900]  Labs:  Recent Labs  07/22/13 1240 07/22/13 1510 07/23/13 0710 07/24/13 0523  WBC 11.5*  --  14.7* 20.0*  HGB 8.4*  --  7.4* 7.5*  PLT 68*  --  65* 60*  CREATININE  --  0.89 0.81 0.64   Estimated CrCl ~ 14 ml/min   Microbiology: Recent Results (from the past 720 hour(s))  CULTURE, BLOOD (ROUTINE X 2)     Status: None   Collection Time    07/18/13  4:55 PM      Result Value Range Status   Specimen Description BLOOD ARM RIGHT   Final   Special Requests BOTTLES DRAWN AEROBIC AND ANAEROBIC 10CC   Final   Culture  Setup Time     Final   Value: 07/19/2013 02:04     Performed at [facility]   Culture     Final   Value: METHICILLIN RESISTANT STAPHYLOCOCCUS AUREUS     Note: RIFAMPIN AND GENTAMICIN SHOULD NOT BE USED AS SINGLE DRUGS FOR TREATMENT OF STAPH INFECTIONS. CRITICAL RESULT CALLED TO, READ BACK BY AND VERIFIED WITH: [facility] MACAFERO 07/21/13 @ 9:11AM BY RUSCOE A.     Note: Gram Stain Report Called to,Read Back By and Verified With: ASHLEY LARSON 07/19/13 1030 BY SMITHERSJ     Performed at [facility]   Report Status 07/21/2013 FINAL   Final   Organism ID, Bacteria METHICILLIN RESISTANT STAPHYLOCOCCUS AUREUS   Final  CULTURE, BLOOD (ROUTINE X 2)     Status: None   Collection Time    07/18/13  5:05 PM      Result Value Range Status   Specimen Description BLOOD HAND LEFT   Final   Special Requests BOTTLES DRAWN AEROBIC ONLY 10CC    Final   Culture  Setup Time     Final   Value: 07/19/2013 02:03     Performed at [facility]   Culture     Final   Value: METHICILLIN RESISTANT STAPHYLOCOCCUS AUREUS     Note: SUSCEPTIBILITIES PERFORMED ON PREVIOUS CULTURE WITHIN THE LAST 5 DAYS.     Note: Gram Stain Report Called to,Read Back By and Verified With: JANET BEASLEY 07/19/13 1035 BY SMITHERSJ     Performed at [facility]   Report Status 07/21/2013 FINAL   Final  MRSA PCR SCREENING     Status: Abnormal   Collection Time    07/19/13  2:37 PM      Result Value Range Status   MRSA by PCR POSITIVE (*) NEGATIVE Final   Comment:            The GeneXpert MRSA Assay (FDA     approved for NASAL specimens     only), is one component of a     comprehensive MRSA colonization     surveillance program. It is not     intended to diagnose MRSA     infection nor to guide or     monitor treatment for  MRSA infections.     RESULT CALLED TO, READ BACK BY AND VERIFIED WITH:     C. SCHILLER RN 15:50 07/19/13 ([email or web address])  URINE CULTURE     Status: None   Collection Time    07/19/13  4:15 PM      Result Value Range Status   Specimen Description URINE, CATHETERIZED   Final   Special Requests NONE   Final   Culture  Setup Time     Final   Value: 07/19/2013 21:45     Performed at [facility] Count     Final   Value: 25,000 COLONIES/ML     Performed at [facility]   Culture     Final   Value: ESCHERICHIA COLI     Performed at [facility]   Report Status 07/21/2013 FINAL   Final   Organism ID, Bacteria ESCHERICHIA COLI   Final  CULTURE, BLOOD (ROUTINE X 2)     Status: None   Collection Time    07/20/13 12:20 PM      Result Value Range Status   Specimen Description BLOOD LEFT ARM   Final   Special Requests BOTTLES DRAWN AEROBIC ONLY 10CC   Final   Culture  Setup Time     Final   Value: 07/20/2013 20:21     Performed at [facility]   Culture     Final   Value:         BLOOD CULTURE RECEIVED NO GROWTH TO DATE CULTURE WILL BE HELD FOR 5 DAYS BEFORE ISSUING A FINAL NEGATIVE REPORT     Performed at [facility]   Report Status PENDING   Incomplete  CLOSTRIDIUM DIFFICILE BY PCR     Status: None   Collection Time    07/21/13  2:17 PM      Result Value Range Status   C difficile by pcr NEGATIVE  NEGATIVE Final    Medical History: Past Medical History  Diagnosis Date  . Asthma   . Hypertension   . Angina   . Osteoarthritis of both knees   . Borderline diabetes   . Headache(784.0) 12/02/11    "sometimes when I wake up in am; real bad"  . Arthritis   . Chronic lower back pain   . Tachycardia   . Cirrhosis     [name] 07/18/2013  . High cholesterol   . Exertional dyspnea 12/01/11  . Shortness of breath     "all the time right now" (07/18/2013)  . On home oxygen therapy     "2L; 24/7" (07/18/2013)  . Type II diabetes mellitus     "not taking RX anymore" (07/18/2013)  . Anxiety   . Depression   . Acute kidney failure 07/18/2013  . Altered mental status     admitting dx 07/18/2013    Medications:  Scheduled:  . ampicillin (OMNIPEN) IV  2 g Intravenous Q8H  . calcium carbonate  1 tablet Oral Daily  . fluticasone  2 spray Each Nare Daily  . lactulose  20 g Oral TID  . pantoprazole (PROTONIX) IV  40 mg Intravenous Q12H  . sodium chloride  3 mL Intravenous Q12H  . vancomycin  1,750 mg Intravenous Q24H   Assessment: 52 yo F admitted 12/18 with 4d history of AMS.  Empirically started on Rocephin for possible infection.  Blood cx resulted with MRSA; Urine cx with EColi - antibiotics narrowed to vancomycin and ampicillin.   Of note,  patient also presented with ARF, SCr 4.98 on admission.  SCr has improved considerably to 0.64 with estimated CrCl >120 and UOP 0.39ml/kg/hr (patient has not had HD during admission). Patient is currently afebrile and WBC is trending up to 20.  This is day #6 of vancomycin therapy.  Vancomycin trough resulted today as  7.5, subtherapeutic likely d/t improved kidney function.  Goal of Therapy:  Vancomycin trough level 15-20 mcg/ml Renal dose adjustment of antibiotics Resolution of infection  Plan:  - give vancomycin IV 2000mg  now and change  to 1500mg  q12hr - plan to draw vancomycin trough at [facility] - completed course of ampicillin therapy for E.coli UTI, discontinued today - f/u clinical progress, and changes in renal function.  Shelba Flake Achilles Dunk, PharmD Clinical Pharmacist - Resident Pager: [phone] Pharmacy: [phone] 07/24/2013 3:11 PM

## 2013-07-24 NOTE — Consult Note (Signed)
Patient ZO:[id number] Colleen Olson      DOB: 01/04/1961      UJW:[id number]     Consult Note from the Palliative Medicine Team at [facility]    Consult Requested by: Dr Mauricio Po    PCP: Rodman Pickle, MD Reason for Consultation:Clarification of GOC and options     Phone Number:[phone]  Assessment of patients Current state: Charlet Olson is a 52 y.o. female presenting with AMS and acute changes in functional status over the past 4 days. H/O alcoholic cirrhosis  . Her AMS started several months ago and it has progressively worsened. She was admitted to the hospital and identified to have sepsis. She was also found to have ARF, but this issue has been improving.   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 ETOH abuse, Asthma, HTN, Borderline DM, HA, Arthritis, Chornic back pain  Overall prognosis is poor and family is faced with advanced directive decsions  Goals of Care: 1.  Code Status: DNR/DNI   2. Scope of Treatment: Continue with present medical treatment plan, Family is hopeful for improvement.  Thye understand the overall poor prognosis but at this time do not wish to de-escalate care in any way.  thye are agreeable to DNR/DNI  4. Disposition:Dependant on outcomes   3. Symptom Management:   Weakness/ Failure to thrive: continue medical management of treatable  4. Psychosocial:  Emotional support offered to family. This is a difficult situation for all, most family live in [place], her son came over the week end to see his mother.  Patient's son and daughter express complicated  Feelings as they re faced with decisions making.    5. Spiritual: Spiritual consulted        Patient Documents Completed or Given: Document Given Completed  Advanced Directives Pkt    MOST    DNR    Gone from My Sight    Hard Choices yes     Brief QIO:NGEXBMWU [name] is a 52 y.o. female presenting with AMS and acute changes in functional status over the past 4 days. H/O alcoholic  cirrhosis  . Her AMS started several months ago and it has progressively worsened. She was admitted to the hospital and identified to have sepsis. She was also found to have ARF, but this issue has been improving.   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 ETOH abuse, Asthma, HTN, Borderline DM, HA, Arthritis, Chornic back pain  Overall prognosis is poor and family is faced with advanced directive decsions     ROS: unable to illicite due to altered cognition    PMH:  Past Medical History  Diagnosis Date  . Asthma   . Hypertension   . Angina   . Osteoarthritis of both knees   . Borderline diabetes   . Headache(784.0) 12/02/11    "sometimes when I wake up in am; real bad"  . Arthritis   . Chronic lower back pain   . Tachycardia   . Cirrhosis     Colleen Olson 07/18/2013  . High cholesterol   . Exertional dyspnea 12/01/11  . Shortness of breath     "all the time right now" (07/18/2013)  . On home oxygen therapy     "2L; 24/7" (07/18/2013)  . Type II diabetes mellitus     "not taking RX anymore" (07/18/2013)  . Anxiety   . Depression   . Acute kidney failure 07/18/2013  . Altered mental status     admitting dx 07/18/2013  PSH: Past Surgical History  Procedure Laterality Date  . Joint replacement    . Total knee arthroplasty Left 03/02/2011  . Tonsillectomy      "I was a little girl"  . Tubal ligation  1982  . Knee arthroscopy Left 2012   I have reviewed the FH and SH and  If appropriate update it with new information. No Known Allergies Scheduled Meds: . ampicillin (OMNIPEN) IV  2 g Intravenous Q8H  . calcium carbonate  1 tablet Oral Daily  . fluticasone  2 spray Each Nare Daily  . lactulose  20 g Oral TID  . pantoprazole (PROTONIX) IV  40 mg Intravenous Q12H  . sodium chloride  3 mL Intravenous Q12H  . vancomycin  1,750 mg Intravenous Q24H   Continuous Infusions:  PRN Meds:.albuterol, dextrose, ondansetron (ZOFRAN) IV, ondansetron    BP 132/59  Pulse 112   Temp(Src) 99 F (37.2 C) (Oral)  Resp 18  Ht 5\' 8"  (1.727 m)  Wt 145 kg (319 lb 10.7 oz)  BMI 48.62 kg/m2  SpO2 99%   PPS:30 % at best   Intake/Output Summary (Last 24 hours) at 07/24/13 1348 Last data filed at 07/24/13 0620  Gross per 24 hour  Intake      0 ml  Output   1350 ml  Net  -1350 ml    Physical Exam:  General: ill appearing, NAD, obese HEENT:  Mm, no exudate Chest:   Decreased in bases CTA CVS: tachycrdic Abdomen:  Soft NT +BS Ext: BLE +2 edema Neuro: awake and makes eye contact, confused to place and sitaution  Labs: CBC    Component Value Date/Time   WBC 20.0* 07/24/2013 0523   RBC 2.79* 07/24/2013 0523   RBC 3.28* 04/07/2013 2025   HGB 7.5* 07/24/2013 0523   HCT 21.0* 07/24/2013 0523   PLT 60* 07/24/2013 0523   MCV 75.3* 07/24/2013 0523   MCH 26.9 07/24/2013 0523   MCHC 35.7 07/24/2013 0523   RDW 16.8* 07/24/2013 0523   LYMPHSABS 2.2 04/07/2013 1542   MONOABS 0.7 04/07/2013 1542   EOSABS 0.1 04/07/2013 1542   BASOSABS 0.1 04/07/2013 1542    BMET    Component Value Date/Time   NA 136 07/24/2013 0523   K 3.7 07/24/2013 0523   CL 104 07/24/2013 0523   CO2 25 07/24/2013 0523   GLUCOSE 97 07/24/2013 0523   BUN 14 07/24/2013 0523   CREATININE 0.64 07/24/2013 0523   CREATININE 1.38* 07/01/2013 1515   CALCIUM 8.3* 07/24/2013 0523   GFRNONAA >90 07/24/2013 0523   GFRAA >90 07/24/2013 0523    CMP     Component Value Date/Time   NA 136 07/24/2013 0523   K 3.7 07/24/2013 0523   CL 104 07/24/2013 0523   CO2 25 07/24/2013 0523   GLUCOSE 97 07/24/2013 0523   BUN 14 07/24/2013 0523   CREATININE 0.64 07/24/2013 0523   CREATININE 1.38* 07/01/2013 1515   CALCIUM 8.3* 07/24/2013 0523   PROT 6.5 07/24/2013 0523   ALBUMIN 2.0* 07/24/2013 0523   AST 123* 07/24/2013 0523   ALT 47* 07/24/2013 0523   ALKPHOS 157* 07/24/2013 0523   BILITOT 4.5* 07/24/2013 0523   GFRNONAA >90 07/24/2013 0523   GFRAA >90 07/24/2013 0523       Time In Time Out Total  Time Spent with Patient Total Overall Time  1330 1445 70 min 75 min    Greater than 50%  of this time was spent counseling and coordinating care related  to the above assessment and plan.  Lorinda Creed NP  Palliative Medicine Team Team Phone # [phone] Pager [phone]  Discussed with Family Medicine Resident on call

## 2013-07-24 NOTE — Progress Notes (Signed)
Speech Language Pathology Treatment: Dysphagia  Patient Details Name: Colleen Olson MRN: [id number] DOB: Dec 25, 1960 Today's Date: 07/24/2013 Time: 1100-1109 SLP Time Calculation (min): 9 min  Assessment / Plan / Recommendation Clinical Impression  Pt in bed finishing breakfast and holding phone in her hand, diet remains full liquids.  Observed pt with graham cracker to determine readiness for dietary advancement.  Slow mastication with mild oral stasis on left noted with delays in pt clearance, suspect due to combination of cognition and dentition.  Pt repeatedly asked SLP if I just had the baby indicating cognition issues.    Recommend advance diet to soft/ground meats/thin with intermittent supervision - assuring pt oral cavity clear at completion of snack/meal.  Pt educated to importance of oral clearance and verbalized understanding.  Note palliative referral pending.    RN reports pt tolerating current diet.  SLP to sign off as goals are met, will page MD to request diet advancement.     HPI HPI: Colleen Olson is a 52 y.o. female presented to hospital 12/18 with AMS and acute changes in functional status over the past 4 days. 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 ETOH abuse, Asthma, HTN, Borderline DM, HA, Arthritis, Chronic back pain.  Pt underwent bedside swallow evaluation with indications of cognitive based dysphagia and was placed on a full liquid diet.  Today pt seen for diagnostic treatment and tolerance of diet advancement of completed.   Pertinent Vitals Afebrile, decreased  SLP Plan  All goals met    Recommendations Diet recommendations: Dysphagia 3 (mechanical soft);Thin liquid (ground meats) Liquids provided via: Cup;Straw Medication Administration:  (as tolerated) Supervision: Patient able to self feed (assess for oral stasis after completion of snack/meal) Compensations: Slow rate;Small sips/bites;Check for pocketing Postural Changes and/or Swallow  Maneuvers: Seated upright 90 degrees;Upright 30-60 min after meal              Oral Care Recommendations: Oral care BID Follow up Recommendations: None Plan: All goals met    GO     Donavan Burnet, MS [facility] SLP [phone]

## 2013-07-24 NOTE — Progress Notes (Signed)
Patient has approximately 20 visitors in the room.  Patient was seen eating a chicken wing.  Patient and family educated as to the patient's risk of aspiration and her dys 3 diet.  Daughter in the room stated. "momma can eat and all these people keep saying she can't."  Educated patient's family and will continue to monitor.  Lance Bosch, RN

## 2013-07-24 NOTE — Progress Notes (Signed)
I have seen and examined this patient. I have discussed with Dr Berline Chough.  I agree with their findings and plans as documented in their progress note. Awaiting results of Palliative Care service GOC meeting with patient's family today before making disposition plans. Pt lacks capacity to make informed decisions at this time due to her active hepatic encephalopathy.

## 2013-07-25 DIAGNOSIS — D696 Thrombocytopenia, unspecified: Secondary | ICD-10-CM

## 2013-07-25 LAB — CBC
HCT: 20.6 % — ABNORMAL LOW (ref 36.0–46.0)
Hemoglobin: 7.3 g/dL — ABNORMAL LOW (ref 12.0–15.0)
MCH: 27 pg (ref 26.0–34.0)
MCV: 76.3 fL — ABNORMAL LOW (ref 78.0–100.0)
Platelets: 61 10*3/uL — ABNORMAL LOW (ref 150–400)
RBC: 2.7 MIL/uL — ABNORMAL LOW (ref 3.87–5.11)
RDW: 17.1 % — ABNORMAL HIGH (ref 11.5–15.5)
WBC: 19.1 10*3/uL — ABNORMAL HIGH (ref 4.0–10.5)

## 2013-07-25 LAB — BASIC METABOLIC PANEL
BUN: 12 mg/dL (ref 6–23)
CO2: 24 mEq/L (ref 19–32)
Calcium: 8.3 mg/dL — ABNORMAL LOW (ref 8.4–10.5)
Chloride: 103 mEq/L (ref 96–112)
Creatinine, Ser: 0.61 mg/dL (ref 0.50–1.10)
Potassium: 4 mEq/L (ref 3.5–5.1)
Sodium: 134 mEq/L — ABNORMAL LOW (ref 135–145)

## 2013-07-25 LAB — GLUCOSE, CAPILLARY
Glucose-Capillary: 93 mg/dL (ref 70–99)
Glucose-Capillary: 132 mg/dL — ABNORMAL HIGH (ref 70–99)
Glucose-Capillary: 112 mg/dL — ABNORMAL HIGH (ref 70–99)

## 2013-07-25 NOTE — Progress Notes (Signed)
FMTS Attending Note Patient seen and examined by me, discussed with resident team and I agree with Dr Paulina Fusi' assessment and plan for today.  Patient is alert, well-appearing, using her bedside TV remote as though it were a telephone (holding to ear); when I introduce myself, she says "I was talking with someone about you."  No apparent distress. Abdomen soft and nontender, obese/mild distention.  Given recent drop in platelets, agree with plan to get HIT panel and possibly a DIC panel if follow-up CBC shows continued drop in platelets.  Paula Compton, MD

## 2013-07-25 NOTE — Progress Notes (Signed)
Ms. Colleen Olson appears comfortable in her room. She is alert and is holding the phone to her ear and says she is speaking to her son Darryl (although I hear no one on the other line). Her daughter and son, Rodell Perna and Rachael Fee did say nursing said she was speaking to them when they aren't in the room. She has no complaints. Nursing just moved her onto a bariatric bed for her comfort. We will continue to follow and support patient and family.  Yong Channel, NP Palliative Medicine Team Team Phone # [phone]

## 2013-07-25 NOTE — Progress Notes (Signed)
Assumed care of patient at 1100 on 07/25/13 from [place], [place].  Completed assessment patient stable at this time.  Will continue to monitor patient.  Call light placed within reach.

## 2013-07-25 NOTE — Progress Notes (Signed)
Family Medicine Teaching Service Daily Progress Note Intern Pager: [phone]  Patient name: Colleen Olson Medical record number: [id number] Date of birth: [birth year] Age: 52 y.o. Gender: female  Primary Care Provider: Rodman Pickle, MD Consultants: Nephrology Code Status: DNR/DNI   Pt Overview and Major Events to Date:  07/18/13 - pt admitted with AMS, severe AKI 07/19/13 - blood cx grow GPC start anti-bx, consult renal, plan to attempt LP 12/20 --> 12/21 - CCM Primary 12/22 - Improved mental status, Hgb 7.1--8.4 12/23 - Hgb >> 7.4; not a candidate for GI intervention; Palliative care consulted 12/24 - Palliative Care meeting, pt DNR/DNI  Assessment and Plan: Colleen Olson is a 52 y.o. female presenting with AMS and acute changes in functional status x 4 days, ultimately found to have sepsis from bacteremia (MRSA), Acute Renal Failure, and UTI (E.Coli). PMH is significant for Cirrhosis 2/2 NASH and ETOH abuse (pt DOES NOT HAVE HEP C AS RNA NEG), Asthma, HTN, Borderline DM, HA, Arthritis, Chronic back pain  ID: # Sepsis, with MRSA Bacteremia with continued worsening leukocytosis # UTI, E.Coli Initially unclear etiology, considered GI source (US revealed minimal ascites), considered pulmonary etiology with suspected atelectasis vs consolidation. No obvious skin lesions or ulcers. Previously received empiric therapy for possible meningitis, LP unable to be obtained. Confirmed UTI with E.Coli, however not source of bacteremia. - (12/18) Blood culture - positive MRSA - c/s ID, greatly appreciate all recs    - Continue Vancomycin (Day 6) and Ampicillin (Day 6)     - repeat blood cultures (12/20) - NGTD    - No vital sign instability, lactic acid 06-23-13 normal    - CT abdomen with possible pancreatitis, lipase elevated as well  Neuro: # Acute Encephalopathy, in setting of cirrhosis, suspected secondary sepsis with bacteremia / ARF Unclear mental status at baseline, concern for  hepatic encephalopathy but ammonia labile (52 >>91 >>52 >> .Marland KitchenMarland Kitchen)  Initial work-up negative with Head CT, considered meningitis however since determined less likely. Overall suspected primary etiology from bacteremia / acute renal failure. - Interactive today but not oriented X4. - continue lactulose  GI: # Cirrhosis, secondary to chronic alcohol use (continued); acute pancreatitis On admission, reportedly saw Hepatologist about 3 weeks ago. No recent ingestions per daughter. Ammonia 52 in ED, and initially scleral icterus noted on exam. Pt on lasix and spirnolactone at home (per report took all her medications this morning). No evidence of bruising or purpura on admission. UDS (+opiates only).   - trend LFTs, Coags    - Albumin 1.6    - Ammonia 52-->91    - T.Bili 2.9 >> 4.5    - increasing INR 1.99-->2.22 (suggestive of hepatic synthetic fxn failure) - lactulose BID, titrate to BMs, strict i/os and daily weights - Patient is not a liver transplant candidate due to continued alcohol use - CT of the abdomen on 12/23 negative for acute abscess however concerning for possible pancreatitis  Heme: # Acute on Chronic Anemia, Microcytic, concern for acute blood loss anemia 2/2 UGI Bleed On admission, reported significant event of bloody production from mouth / nose. Worsening of chronic microcytic anemia, positive FOBT in Oct 2014 and likely to have upper GI bleed now in setting of increasing INR > 2.0. FOBT (positive 07/19/13) Currently no active signs of bleeding, no hematemesis, no hematochezia - b/l Hgb 9 - 10 - decreasing Hgb trend 8.1-->7.6-->7.5-->7.1-->8.4 -- > 7.4 --> 7.5>7.3 - consider transfusion threshold at < 7.0 - Continue Protonix 40mg  IV BID - Concern  for potential decompensation if persistent GI bleed, considered FFP transfusion, continue to monitor, low threshold for returning to SDU status - GI reports not a candidate for intervention, palliative care recommend  DNR/DNI  #Thrombocytopenia - Pt platelet count has dropped from 120 to 60 currently - Holding anticoagulation or antiplatelet medications - Could consider smear along with HITT panel, along with DIC panel  # AKI, suspected pre-renal etiology -Resolved  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  H/o baseline Cr 0.7, with significant increase to 5.0 on admission - improving Cr trend 4.34-->3.85-->3.52-->2.20 --> 0.89 --> 0.81 --> 0.64 - Good UOP  - continue to hold spironolactone / lasix, nephrotoxic meds C/s Nephrology, greatly appreciate all recommendations   - stopped IVF bicarb / albumin. initially treated with IVF isotonic bicarb, Albumin --> improved BP and UOP   - Would not offer HD (no impact on survival, if not a transplant candidate)   - continue to trend  # Hyperkalemia - Resolved # Hyponatremia - Resolved # Metabolic Acidosis - Resolved - initially received bicarb gtt, since stopped with normalized metabolic disturbances  Resp: # Asthma on O2- On 2LNC at home -cont albuterol prn  -home o2 requirement  -pulse ox continuous    # Right knee pain- s/p TKR. No acute exacerbating factors. Pt daughter report has not ambulated since surgery.  - Hold chronic pain meds given AMS  FEN/GI: NPO, cont NS at 125 ml/hr PPx: INR 2 and platelets 60s, no anticoagulant  Disposition: Patient admitted to floor, transferred to SDU and then to the ICU, managed by PCCM 12/20 to 12/21, transferred back to floor status. Overall continued work-up and medical stabilization to determine outcome and future plan in patient with poor long-term prognosis.  Palliative Care consulted, now DNR  Subjective: Pt is awake and interactive but not coherent. Denies pain  Objective: Temp:  [98 F (36.7 C)-99.3 F (37.4 C)] 99.1 F (37.3 C) (12/25 0500) Pulse Rate:  [111-116] 116 (12/25 0500) Resp:  [18-20] 19 (12/25 0500) BP: (114-141)/(47-59) 114/47 mmHg (12/25  0500) SpO2:  [94 %-99 %] 97 % (12/25 0500) Weight:  [323 lb 10.2 oz (146.8 kg)] 323 lb 10.2 oz (146.8 kg) (12/25 0500) Physical Exam: General: obese, altered mentation but fully awake and alert, tangential speech / conversation, NAD HEENT: NCAT, mild scleral icterus, MMM Cardiovascular: RRR, no murmurs heard Respiratory: CTA B/L  Abdomen: obese, soft, NTND, +active BS Extremities: WWP, b/l LE +1-2 pitting edema, with evidence of TKR on left Skin: no evidence of bruising or bleeding Neuro: awake, and alert, not oriented X4.   Laboratory:  Recent Labs Lab 07/23/13 0710 07/24/13 0523 07/25/13 0640  WBC 14.7* 20.0* 19.1*  HGB 7.4* 7.5* 7.3*  HCT 20.9* 21.0* 20.6*  PLT 65* 60* 61*    Recent Labs Lab 07/19/13 0245 07/19/13 1636  07/23/13 0710 07/24/13 0523 07/25/13 0640  NA 134* 132*  < > 139 136 134*  K 6.0* 6.4*  < > 4.0 3.7 4.0  CL 104 103  < > 106 104 103  CO2 20 20  < > 28 25 24   BUN 36* 39*  < > 18 14 12   CREATININE 5.13* 4.53*  < > 0.81 0.64 0.61  CALCIUM 7.9* 7.4*  < > 8.6 8.3* 8.3*  PROT 7.4 7.2  --   --  6.5  --   BILITOT 3.0* 2.9*  --   --  4.5*  --   ALKPHOS 153* 122*  --   --  157*  --   ALT 13 14  --   --  47*  --   AST 43* 56*  --   --  123*  --   GLUCOSE 104* 96  < > 119* 97 94  < > = values in this interval not displayed.  C.Diff (negative) Urine Culture (E.Coli 25,000CFU - pan-sensitive)  Blood Cultures (12/18) MRSA+ Repeat Blood Cultures (12/20) NGTD  Imaging/Diagnostic Tests:  12/16 Knee Xray IMPRESSION:  No acute fracture or subluxation. Stable postsurgical changes. Mild  degenerative changes.  12/18 Hip Xray IMPRESSION:  Normal exam.  12/18 Head CT w/o contrast IMPRESSION:  Study within normal limits.  12/18 Chest Xray 1v FINDINGS:  There is no edema or consolidation. Heart is mildly enlarged with  normal pulmonary vascularity. No appreciable adenopathy. There is  persistent mild elevation the right hemidiaphragm.  IMPRESSION:   Cardiac enlargement, stable. No edema or consolidation.  12/20 Repeat Portable CXR IMPRESSION:  The findings are consistent with atelectasis or developing pneumonia  diffusely on the left and in the right upper lobe. This may reflect  the clinically suspected aspiration.   Briscoe Deutscher, DO 07/25/2013, 9:25 AM PGY-2, [place] [facility] FPTS Intern pager: [phone] Amion password: [email or web address], text pages welcome

## 2013-07-26 DIAGNOSIS — M1710 Unilateral primary osteoarthritis, unspecified knee: Secondary | ICD-10-CM

## 2013-07-26 DIAGNOSIS — R609 Edema, unspecified: Secondary | ICD-10-CM

## 2013-07-26 DIAGNOSIS — K7290 Hepatic failure, unspecified without coma: Secondary | ICD-10-CM

## 2013-07-26 DIAGNOSIS — R52 Pain, unspecified: Secondary | ICD-10-CM

## 2013-07-26 LAB — CBC
HCT: 20 % — ABNORMAL LOW (ref 36.0–46.0)
Hemoglobin: 7.1 g/dL — ABNORMAL LOW (ref 12.0–15.0)
MCV: 77.5 fL — ABNORMAL LOW (ref 78.0–100.0)
RDW: 18.2 % — ABNORMAL HIGH (ref 11.5–15.5)
WBC: 18.9 10*3/uL — ABNORMAL HIGH (ref 4.0–10.5)

## 2013-07-26 LAB — HEPATIC FUNCTION PANEL
ALT: 46 U/L — ABNORMAL HIGH (ref 0–35)
AST: 101 U/L — ABNORMAL HIGH (ref 0–37)
Albumin: 2 g/dL — ABNORMAL LOW (ref 3.5–5.2)
Alkaline Phosphatase: 163 U/L — ABNORMAL HIGH (ref 39–117)
Total Bilirubin: 6.2 mg/dL — ABNORMAL HIGH (ref 0.3–1.2)
Total Protein: 6.5 g/dL (ref 6.0–8.3)

## 2013-07-26 LAB — BASIC METABOLIC PANEL
BUN: 12 mg/dL (ref 6–23)
CO2: 25 mEq/L (ref 19–32)
Chloride: 103 mEq/L (ref 96–112)
Creatinine, Ser: 0.63 mg/dL (ref 0.50–1.10)
Glucose, Bld: 103 mg/dL — ABNORMAL HIGH (ref 70–99)
Potassium: 3.8 mEq/L (ref 3.5–5.1)

## 2013-07-26 LAB — DIC (DISSEMINATED INTRAVASCULAR COAGULATION)PANEL
Platelets: 68 10*3/uL — ABNORMAL LOW (ref 150–400)
Smear Review: NONE SEEN
aPTT: 45 seconds — ABNORMAL HIGH (ref 24–37)

## 2013-07-26 LAB — CULTURE, BLOOD (ROUTINE X 2): Culture: NO GROWTH

## 2013-07-26 LAB — VANCOMYCIN, TROUGH: Vancomycin Tr: 19.5 ug/mL (ref 10.0–20.0)

## 2013-07-26 LAB — SAVE SMEAR

## 2013-07-26 LAB — GLUCOSE, CAPILLARY
Glucose-Capillary: 116 mg/dL — ABNORMAL HIGH (ref 70–99)
Glucose-Capillary: 121 mg/dL — ABNORMAL HIGH (ref 70–99)
Glucose-Capillary: 91 mg/dL (ref 70–99)
Glucose-Capillary: 94 mg/dL (ref 70–99)

## 2013-07-26 LAB — DIC (DISSEMINATED INTRAVASCULAR COAGULATION) PANEL
Fibrinogen: 275 mg/dL (ref 204–475)
Prothrombin Time: 26.4 seconds — ABNORMAL HIGH (ref 11.6–15.2)

## 2013-07-26 LAB — TECHNOLOGIST SMEAR REVIEW

## 2013-07-26 MED ORDER — OXYCODONE HCL 5 MG PO TABS
10.0000 mg | ORAL_TABLET | ORAL | Status: DC | PRN
  Administered 2013-07-26 – 2013-07-28 (×6): 10 mg via ORAL
  Filled 2013-07-26 (×6): qty 2

## 2013-07-26 MED ORDER — OXYCODONE HCL 5 MG PO TABS
10.0000 mg | ORAL_TABLET | Freq: Three times a day (TID) | ORAL | Status: DC | PRN
  Administered 2013-07-26 (×2): 10 mg via ORAL
  Filled 2013-07-26 (×2): qty 2

## 2013-07-26 MED ORDER — RIFAXIMIN 550 MG PO TABS
550.0000 mg | ORAL_TABLET | Freq: Two times a day (BID) | ORAL | Status: DC
  Administered 2013-07-26 – 2013-07-29 (×7): 550 mg via ORAL
  Filled 2013-07-26 (×9): qty 1

## 2013-07-26 MED ORDER — DIPHENHYDRAMINE HCL 25 MG PO CAPS
25.0000 mg | ORAL_CAPSULE | Freq: Three times a day (TID) | ORAL | Status: DC | PRN
  Administered 2013-07-26 – 2013-07-28 (×4): 25 mg via ORAL
  Filled 2013-07-26 (×4): qty 1

## 2013-07-26 MED ORDER — WHITE PETROLATUM GEL
Status: AC
  Administered 2013-07-26: 0.2
  Filled 2013-07-26: qty 5

## 2013-07-26 NOTE — Progress Notes (Signed)
Family Medicine Teaching Service Daily Progress Note Intern Pager: [phone]  Patient name: Colleen Olson Medical record number: [id number] Date of birth: 02/12/1961 Age: 52 y.o. Gender: female  Primary Care Provider: Rodman Pickle, MD Consultants: Nephrology Code Status: DNR/DNI   Pt Overview and Major Events to Date:  07/18/13 - pt admitted with AMS, severe AKI 07/19/13 - blood cx grow GPC start anti-bx, consult renal, plan to attempt LP 12/20 --> 12/21 - CCM Primary 12/22 - Improved mental status, Hgb 7.1--8.4 12/23 - Hgb >> 7.4; not a candidate for GI intervention; Palliative care consulted 12/24 - Palliative Care meeting, pt DNR/DNI 12/25 - Continued drop in platelets (120 to 59 over one week) 12/26 - Addition of Rifaximin for Hepatic Encephalopathy   Assessment and Plan: Colleen Olson is a 52 y.o. female presenting with AMS and acute changes in functional status x 4 days, ultimately found to have sepsis from bacteremia (MRSA), Acute Renal Failure, and UTI (E.Coli). PMH is significant for Cirrhosis 2/2 NASH and ETOH abuse (pt DOES NOT HAVE HEP C AS RNA NEG), Asthma, HTN, Borderline DM, HA, Arthritis, Chronic back pain  ID: # Sepsis, with MRSA Bacteremia with continued worsening leukocytosis # UTI, E.Coli Initially unclear etiology, considered GI source (US revealed minimal ascites), considered pulmonary etiology with suspected atelectasis vs consolidation. No obvious skin lesions or ulcers. Previously received empiric therapy for possible meningitis, LP unable to be obtained. Confirmed UTI with E.Coli, however not source of bacteremia. - (12/18) Blood culture - positive MRSA - c/s ID, greatly appreciate all recs    - Continue Vancomycin (Day 7) and Ampicillin (Day 7)     - repeat blood cultures (12/20) - NGTD    - No vital sign instability, lactic acid 06-23-13 normal    - CT abdomen with possible pancreatitis, lipase elevated as well  Neuro: # Acute Encephalopathy, in  setting of cirrhosis, suspected secondary sepsis with bacteremia / ARF Unclear mental status at baseline, concern for hepatic encephalopathy with ammonia labile (52 >>91 >>52 >> .Marland KitchenMarland Kitchen)  Initial work-up negative with Head CT, considered meningitis however since determined less likely. Overall suspected primary etiology from bacteremia / acute renal failure. - Interactive today but not oriented X4. - continue lactulose,  - Addition of Rifaximin for hepatic encephalopathy  GI: # Cirrhosis, secondary to chronic alcohol use (continued); acute pancreatitis On admission, reportedly saw Hepatologist about 3 weeks ago. No recent ingestions per daughter. Ammonia 52 in ED, and initially scleral icterus noted on exam. Pt on lasix and spirnolactone at home. No evidence of bruising or purpura on admission. UDS (+opiates only).   - trend LFTs, Coags  Lab Results  Component Value Date   ALT 46* 07/26/2013   AST 101* 07/26/2013   ALKPHOS 163* 07/26/2013   BILITOT 6.2* 07/26/2013     - increasing INR 1.99-->2.22>> 2.43 (suggestive of hepatic synthetic fxn failure) - lactulose BID, titrate to BMs, strict i/os and daily weights - Patient is not a liver transplant candidate due to continued alcohol use - CT of the abdomen on 12/23 negative for acute abscess however concerning for possible pancreatitis  Heme: # Acute on Chronic Anemia, Microcytic, concern for acute blood loss anemia 2/2 UGI Bleed On admission, reported significant event of bloody production from mouth / nose. Worsening of chronic microcytic anemia, positive FOBT in Oct 2014 and likely to have upper GI bleed now in setting of increasing INR > 2.0. FOBT (positive 07/19/13) Currently no active signs of bleeding, no hematemesis, no hematochezia -  b/l Hgb 9 - 10 - decreasing Hgb trend 8.1-->7.6-->7.5-->7.1-->8.4 -- > 7.4 --> 7.5>7.3>>7.1 - consider transfusion threshold at < 7.0 - Continue Protonix 40mg  IV BID - Concern for potential decompensation  if persistent GI bleed, considered FFP transfusion, continue to monitor, low threshold for returning to SDU status - GI reports not a candidate for intervention, palliative care recommend DNR/DNI  #Thrombocytopenia - Pt platelet count has dropped from 120 to 60 currently - Holding anticoagulation or antiplatelet medications - HITT panel and peripheral smear today - Will add on DIC panel to evaluate as well   # AKI, suspected pre-renal etiology -Resolved  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  H/o baseline Cr 0.7, with significant increase to 5.0 on admission - improving Cr trend 4.34-->3.85-->3.52-->2.20 --> 0.89 --> 0.81 --> 0.64 - Good UOP  - continue to hold spironolactone / lasix, nephrotoxic meds C/s Nephrology, greatly appreciate all recommendations   - stopped IVF bicarb / albumin. initially treated with IVF isotonic bicarb, Albumin --> improved BP and UOP   - Would not offer HD (no impact on survival, if not a transplant candidate)   - continue to trend  # Hyperkalemia - Resolved # Hyponatremia - Resolved # Metabolic Acidosis - Resolved - initially received bicarb gtt, since stopped with normalized metabolic disturbances  Resp: # Asthma on O2- On 2LNC at home -cont albuterol prn  -home o2 requirement  -pulse ox continuous    # Right knee pain- s/p TKR. No acute exacerbating factors. Pt daughter report has not ambulated since surgery.  - Restarting oxycodone home dosing as pt c/o pain and mentation much improved   FEN/GI: NPO, cont NS at 125 ml/hr PPx: INR > 2 and platelets 60s, no anticoagulant  Disposition: Patient admitted to floor, transferred to SDU and then to the ICU, managed by PCCM 12/20 to 12/21, transferred back to floor status. Overall continued work-up and medical stabilization to determine outcome and future plan in patient with poor long-term prognosis.  Palliative Care consulted, now DNR  Subjective: Pt more awake  today, stating lower extremity pain B/L.   Objective: Temp:  [98.2 F (36.8 C)-99.6 F (37.6 C)] 98.2 F (36.8 C) (12/26 0636) Pulse Rate:  [107-121] 107 (12/26 0636) Resp:  [20-24] 22 (12/26 0636) BP: (135-148)/(58-75) 135/68 mmHg (12/26 0636) SpO2:  [96 %-98 %] 98 % (12/26 0636) Physical Exam: General: obese, tangential speech / conversation, NAD HEENT: NCAT, mild scleral icterus, MMM Cardiovascular: RRR, no murmurs heard Respiratory: CTA B/L  Abdomen: obese, soft, NTND, +active BS Extremities: WWP, b/l LE +1-2 pitting edema, with evidence of TKR on left Skin: no evidence of bruising or bleeding Neuro: awake, and alert, Oriented x 3 this AM   Laboratory:  Recent Labs Lab 07/24/13 0523 07/25/13 0640 07/26/13 0440  WBC 20.0* 19.1* 18.9*  HGB 7.5* 7.3* 7.1*  HCT 21.0* 20.6* 20.0*  PLT 60* 61* 59*    Recent Labs Lab 07/19/13 1636  07/24/13 0523 07/25/13 0640 07/26/13 0440  NA 132*  < > 136 134* 134*  K 6.4*  < > 3.7 4.0 3.8  CL 103  < > 104 103 103  CO2 20  < > 25 24 25   BUN 39*  < > 14 12 12   CREATININE 4.53*  < > 0.64 0.61 0.63  CALCIUM 7.4*  < > 8.3* 8.3* 8.3*  PROT 7.2  --  6.5  --  6.5  BILITOT 2.9*  --  4.5*  --  6.2*  ALKPHOS 122*  --  157*  --  163*  ALT 14  --  47*  --  46*  AST 56*  --  123*  --  101*  GLUCOSE 96  < > 97 94 103*  < > = values in this interval not displayed.  C.Diff (negative) Urine Culture (E.Coli 25,000CFU - pan-sensitive)  Blood Cultures (12/18) MRSA+ Repeat Blood Cultures (12/20) NGTD  Imaging/Diagnostic Tests:  12/16 Knee Xray IMPRESSION:  No acute fracture or subluxation. Stable postsurgical changes. Mild  degenerative changes.  12/18 Hip Xray IMPRESSION:  Normal exam.  12/18 Head CT w/o contrast IMPRESSION:  Study within normal limits.  12/18 Chest Xray 1v FINDINGS:  There is no edema or consolidation. Heart is mildly enlarged with  normal pulmonary vascularity. No appreciable adenopathy. There is   persistent mild elevation the right hemidiaphragm.  IMPRESSION:  Cardiac enlargement, stable. No edema or consolidation.  12/20 Repeat Portable CXR IMPRESSION:  The findings are consistent with atelectasis or developing pneumonia  diffusely on the left and in the right upper lobe. This may reflect  the clinically suspected aspiration.   Briscoe Deutscher, DO 07/26/2013, 7:16 AM PGY-2, [facility] FPTS Intern pager: [phone] Amion password: [email or web address], text pages welcome

## 2013-07-26 NOTE — Progress Notes (Signed)
Progress Note from the Palliative Medicine Team at [facility]  Subjective: patietn is awake and alert and engaged in conversation with family regarding her wishes       (large family from [place])    Objective: No Known Allergies Scheduled Meds: . calcium carbonate  1 tablet Oral Daily  . fluticasone  2 spray Each Nare Daily  . lactulose  20 g Oral TID  . pantoprazole (PROTONIX) IV  40 mg Intravenous Q12H  . rifaximin  550 mg Oral BID  . sodium chloride  3 mL Intravenous Q12H  . vancomycin  1,500 mg Intravenous Q12H   Continuous Infusions:  PRN Meds:.albuterol, dextrose, ondansetron (ZOFRAN) IV, ondansetron, oxyCODONE  BP 136/58  Pulse 106  Temp(Src) 100 F (37.8 C) (Oral)  Resp 24  Ht 5\' 8"  (1.727 m)  Wt 146.8 kg (323 lb 10.2 oz)  BMI 49.22 kg/m2  SpO2 98%   PPS: 30 % at best  Pain Score: reports generalized and poorly managed leg pain   Intake/Output Summary (Last 24 hours) at 07/26/13 1742 Last data filed at 07/26/13 0800  Gross per 24 hour  Intake    420 ml  Output    900 ml  Net   -480 ml      LBM: rectal tube 07-26-13 for loose large amt of stool      Physical Exam: General: ill appearing, NAD, obese  HEENT: Mm, no exudate  Chest: Decreased in bases CTA  CVS: tachycrdic  Abdomen: Soft NT +BS  Ext: BLE +2 edema  Neuro: awake and oriented X3, expresses insight an understanding into her medical  sitaution   Labs: CBC    Component Value Date/Time   WBC 18.9* 07/26/2013 0440   RBC 2.58* 07/26/2013 0440   RBC 3.28* 04/07/2013 2025   HGB 7.1* 07/26/2013 0440   HCT 20.0* 07/26/2013 0440   PLT 68* 07/26/2013 1137   MCV 77.5* 07/26/2013 0440   MCH 27.5 07/26/2013 0440   MCHC 35.5 07/26/2013 0440   RDW 18.2* 07/26/2013 0440   LYMPHSABS 2.2 04/07/2013 1542   MONOABS 0.7 04/07/2013 1542   EOSABS 0.1 04/07/2013 1542   BASOSABS 0.1 04/07/2013 1542    BMET    Component Value Date/Time   NA 134* 07/26/2013 0440   K 3.8 07/26/2013 0440    CL 103 07/26/2013 0440   CO2 25 07/26/2013 0440   GLUCOSE 103* 07/26/2013 0440   BUN 12 07/26/2013 0440   CREATININE 0.63 07/26/2013 0440   CREATININE 1.38* 07/01/2013 1515   CALCIUM 8.3* 07/26/2013 0440   GFRNONAA >90 07/26/2013 0440   GFRAA >90 07/26/2013 0440    CMP     Component Value Date/Time   NA 134* 07/26/2013 0440   K 3.8 07/26/2013 0440   CL 103 07/26/2013 0440   CO2 25 07/26/2013 0440   GLUCOSE 103* 07/26/2013 0440   BUN 12 07/26/2013 0440   CREATININE 0.63 07/26/2013 0440   CREATININE 1.38* 07/01/2013 1515   CALCIUM 8.3* 07/26/2013 0440   PROT 6.5 07/26/2013 0440   ALBUMIN 2.0* 07/26/2013 0440   AST 101* 07/26/2013 0440   ALT 46* 07/26/2013 0440   ALKPHOS 163* 07/26/2013 0440   BILITOT 6.2* 07/26/2013 0440   GFRNONAA >90 07/26/2013 0440   GFRAA >90 07/26/2013 0440    Assessment and Plan:  . Code Status: DNR/DNI  2. Scope of Treatment:  Continue with present medical treatment plan.  Both patient and her daughter [name]/HPOA are leaning  toward a more comfort care approach.  They understand the overall poor prognosis, however they are now trying to consider other family members in the decision (including the patient's mother) who are having a hard time accepting the prognosis.   4. Disposition:Dependant on outcomes  3. Symptom Management:  Patient verbalizes poor pain control.  We discussed pain management strategies and I will make adjustments -Oxycodone 10 mg PO every 4 hrs prn -Weakness/ Failure to thrive: continue medical management of treatable  4. Psychosocial: Emotional support offered to family.  Questions and concerns addressed, education regarding diagnosis offered  5. Spiritual: Spiritual consulted    Patient Documents Completed or Given: Document Given Completed  Advanced Directives Pkt    MOST yes   DNR    Gone from My Sight    Hard Choices yes     Time In Time Out Total Time Spent with Patient Total Overall Time  1745 1830 45 min 45  min    Greater than 50%  of this time was spent counseling and coordinating care related to the above assessment and plan.  Lorinda Creed NP  Palliative Medicine Team Team Phone # [phone] Pager [phone]  Discussed with Family Practice resident 1

## 2013-07-26 NOTE — Progress Notes (Signed)
Chaplain responded to spiritual care consult and consulted with pt's nurse, who said that pt has been confused, has been transitioned to palliative care, and is very sick. Pt expressed many emotions - confusion, sadness, anger, fear, helplessness. Chaplain provided emotional and spiritual support, prayer, empathic listening, and caring presence. Will refer to unit chaplain for Monday. Please page on-call chaplain if urgent support is needed this weekend.   Guy Sandifer [place], [place] [phone]

## 2013-07-26 NOTE — Progress Notes (Signed)
FMTS Attending Note  I personally saw and evaluated the patient. The plan of care was discussed with the resident team. I agree with the assessment and plan as documented by the resident.   1. Encephalopathy - due to severe liver disease and Urosepsis/Bacteremia, improved today 2. Sepsis/MRSA Bacteremia - continue Zosyn, appreciate ID input 3. Ecoli UTI - resolved, completed course of Ampicillin 4. Liver Cirrhosis - INR remains elevated, GI has signed off, hepatic encephalopathy improving, agree with continued lactulose and Rifaximin 5. Thrombocytopenia - HIT panel pending, will order DIC panel 6. Acute on chronic anemia - exacerbated by GI bleed/elevated INR from liver disease, continue Protonix, monitor hemoglobin, transfusion threshold of 7 7. Left knee pain - likely due to immobilization, slightly warm as warm pack is overlying area, no erythema to suggest septic joint, will apply ice pack and ask PT to evaluate once patients mental status has improved, ok to restart low dose narcotics as mental status is improving  Donnella Sham MD

## 2013-07-26 NOTE — Progress Notes (Signed)
ANTIBIOTIC CONSULT NOTE - FOLLOW UP  Pharmacy Consult for Vancomycine Indication: MRSA bacteremia  No Known Allergies  Patient Measurements: Height: 5\' 8"  (172.7 cm) Weight: 323 lb 10.2 oz (146.8 kg) IBW/kg (Calculated) : 63.9 Adjusted Body Weight:   Vital Signs: Temp: 100 F (37.8 C) (12/26 1009) Temp src: Oral (12/26 1009) BP: 136/58 mmHg (12/26 1009) Pulse Rate: 106 (12/26 1009) Intake/Output from previous day: 12/25 0701 - 12/26 0700 In: 240  Out: 1375 [Urine:1375] Intake/Output from this shift:    Labs:  Recent Labs  07/24/13 0523 07/25/13 0640 07/26/13 0440 07/26/13 1137  WBC 20.0* 19.1* 18.9*  --   HGB 7.5* 7.3* 7.1*  --   PLT 60* 61* 59* 68*  CREATININE 0.64 0.61 0.63  --    Estimated Creatinine Clearance: 123.2 ml/min (by C-G formula based on Cr of 0.63).  Recent Labs  07/24/13 1327 07/26/13 1909  VANCOTROUGH 7.5* 19.5     Microbiology: Recent Results (from the past 720 hour(s))  CULTURE, BLOOD (ROUTINE X 2)     Status: None   Collection Time    07/18/13  4:55 PM      Result Value Range Status   Specimen Description BLOOD ARM RIGHT   Final   Special Requests BOTTLES DRAWN AEROBIC AND ANAEROBIC 10CC   Final   Culture  Setup Time     Final   Value: 07/19/2013 02:04     Performed at [facility]   Culture     Final   Value: METHICILLIN RESISTANT STAPHYLOCOCCUS AUREUS     Note: RIFAMPIN AND GENTAMICIN SHOULD NOT BE USED AS SINGLE DRUGS FOR TREATMENT OF STAPH INFECTIONS. CRITICAL RESULT CALLED TO, READ BACK BY AND VERIFIED WITH: [facility] MACAFERO 07/21/13 @ 9:11AM BY RUSCOE A.     Note: Gram Stain Report Called to,Read Back By and Verified With: ASHLEY LARSON 07/19/13 1030 BY SMITHERSJ     Performed at [facility]   Report Status 07/21/2013 FINAL   Final   Organism ID, Bacteria METHICILLIN RESISTANT STAPHYLOCOCCUS AUREUS   Final  CULTURE, BLOOD (ROUTINE X 2)     Status: None   Collection Time    07/18/13  5:05 PM      Result  Value Range Status   Specimen Description BLOOD HAND LEFT   Final   Special Requests BOTTLES DRAWN AEROBIC ONLY 10CC   Final   Culture  Setup Time     Final   Value: 07/19/2013 02:03     Performed at [facility]   Culture     Final   Value: METHICILLIN RESISTANT STAPHYLOCOCCUS AUREUS     Note: SUSCEPTIBILITIES PERFORMED ON PREVIOUS CULTURE WITHIN THE LAST 5 DAYS.     Note: Gram Stain Report Called to,Read Back By and Verified With: JANET BEASLEY 07/19/13 1035 BY SMITHERSJ     Performed at [facility]   Report Status 07/21/2013 FINAL   Final  MRSA PCR SCREENING     Status: Abnormal   Collection Time    07/19/13  2:37 PM      Result Value Range Status   MRSA by PCR POSITIVE (*) NEGATIVE Final   Comment:            The GeneXpert MRSA Assay (FDA     approved for NASAL specimens     only), is one component of a     comprehensive MRSA colonization     surveillance program. It is not     intended  to diagnose MRSA     infection nor to guide or     monitor treatment for     MRSA infections.     RESULT CALLED TO, READ BACK BY AND VERIFIED WITH:     C. SCHILLER RN 15:50 07/19/13 ([email or web address])  URINE CULTURE     Status: None   Collection Time    07/19/13  4:15 PM      Result Value Range Status   Specimen Description URINE, CATHETERIZED   Final   Special Requests NONE   Final   Culture  Setup Time     Final   Value: 07/19/2013 21:45     Performed at [facility] Count     Final   Value: 25,000 COLONIES/ML     Performed at [facility]   Culture     Final   Value: ESCHERICHIA COLI     Performed at [facility]   Report Status 07/21/2013 FINAL   Final   Organism ID, Bacteria ESCHERICHIA COLI   Final  CULTURE, BLOOD (ROUTINE X 2)     Status: None   Collection Time    07/20/13 12:20 PM      Result Value Range Status   Specimen Description BLOOD LEFT ARM   Final   Special Requests BOTTLES DRAWN AEROBIC ONLY 10CC   Final   Culture  Setup  Time     Final   Value: 07/20/2013 20:21     Performed at [facility]   Culture     Final   Value: NO GROWTH 5 DAYS     Performed at [facility]   Report Status 07/26/2013 FINAL   Final  CLOSTRIDIUM DIFFICILE BY PCR     Status: None   Collection Time    07/21/13  2:17 PM      Result Value Range Status   C difficile by pcr NEGATIVE  NEGATIVE Final    Anti-infectives   Start     Dose/Rate Route Frequency Ordered Stop   07/26/13 1000  rifaximin (XIFAXAN) tablet 550 mg     550 mg Oral 2 times daily 07/26/13 0823     07/25/13 0700  vancomycin (VANCOCIN) 1,500 mg in sodium chloride 0.9 % 500 mL IVPB     1,500 mg 250 mL/hr over 120 Minutes Intravenous Every 12 hours 07/24/13 1538     07/24/13 1545  vancomycin (VANCOCIN) 2,000 mg in sodium chloride 0.9 % 500 mL IVPB     2,000 mg 250 mL/hr over 120 Minutes Intravenous  Once 07/24/13 1538 07/24/13 1808   07/21/13 1230  vancomycin (VANCOCIN) 1,750 mg in sodium chloride 0.9 % 500 mL IVPB  Status:  Discontinued     1,750 mg 250 mL/hr over 120 Minutes Intravenous Every 24 hours 07/21/13 1231 07/24/13 1537   07/21/13 1200  vancomycin (VANCOCIN) 1,750 mg in sodium chloride 0.9 % 500 mL IVPB  Status:  Discontinued     1,750 mg 250 mL/hr over 120 Minutes Intravenous Every 48 hours 07/19/13 1131 07/21/13 1231   07/20/13 0630  ampicillin (OMNIPEN) 2 g in sodium chloride 0.9 % 50 mL IVPB     2 g 150 mL/hr over 20 Minutes Intravenous 3 times per day 07/20/13 0624 07/24/13 2244   07/19/13 1800  ampicillin (OMNIPEN) 2 g in sodium chloride 0.9 % 50 mL IVPB  Status:  Discontinued     2 g 150 mL/hr over 20 Minutes Intravenous Every 12 hours  07/19/13 1214 07/20/13 0616   07/19/13 1600  acyclovir (ZOVIRAX) 320 mg in dextrose 5 % 100 mL IVPB  Status:  Discontinued     320 mg 106.4 mL/hr over 60 Minutes Intravenous Every 24 hours 07/19/13 1214 07/20/13 0327   07/19/13 1400  cefTRIAXone (ROCEPHIN) 2 g in dextrose 5 % 50 mL IVPB  Status:   Discontinued     2 g 100 mL/hr over 30 Minutes Intravenous Every 12 hours 07/19/13 1214 07/20/13 1135   07/19/13 1200  vancomycin (VANCOCIN) 2,500 mg in sodium chloride 0.9 % 500 mL IVPB     2,500 mg 250 mL/hr over 120 Minutes Intravenous  Once 07/19/13 1131 07/19/13 1612   07/18/13 1615  cefTRIAXone (ROCEPHIN) 1 g in dextrose 5 % 50 mL IVPB  Status:  Discontinued     1 g 100 mL/hr over 30 Minutes Intravenous Every 24 hours 07/18/13 1603 07/18/13 1604      Assessment: 52yo morbidly obese female with MRSA bacteremia, currently on Vancomycin 1500mg  IV q12.  Vancomycin trough today 19.5, within goal range.  Pt admitted in ARF, but Cr < 1 x 5 days now.    Goal of Therapy:  Vancomycin trough level 15-20 mcg/ml  Plan:  1-  Continue current dosing 2-  Consider repeat trough in 2-3 days to verify pt is not accumulating 3-  Watch renal function closely  Marisue Humble, PharmD Clinical Pharmacist [facility]

## 2013-07-27 DIAGNOSIS — R52 Pain, unspecified: Secondary | ICD-10-CM

## 2013-07-27 LAB — GLUCOSE, CAPILLARY
Glucose-Capillary: 93 mg/dL (ref 70–99)
Glucose-Capillary: 85 mg/dL (ref 70–99)
Glucose-Capillary: 100 mg/dL — ABNORMAL HIGH (ref 70–99)

## 2013-07-27 LAB — BASIC METABOLIC PANEL
BUN: 10 mg/dL (ref 6–23)
Calcium: 8.1 mg/dL — ABNORMAL LOW (ref 8.4–10.5)
Chloride: 103 mEq/L (ref 96–112)
Creatinine, Ser: 0.57 mg/dL (ref 0.50–1.10)
GFR calc Af Amer: >90 mL/min (ref >90)
GFR calc non Af Amer: >90 mL/min (ref >90)
Potassium: 4.3 mEq/L (ref 3.5–5.1)
Sodium: 129 mEq/L — ABNORMAL LOW (ref 135–145)

## 2013-07-27 LAB — CBC
HCT: 19.1 % — ABNORMAL LOW (ref 36.0–46.0)
MCHC: 36.1 g/dL — ABNORMAL HIGH (ref 30.0–36.0)
MCV: 79.3 fL (ref 78.0–100.0)
Platelets: 94 10*3/uL — ABNORMAL LOW (ref 150–400)
RDW: 19.9 % — ABNORMAL HIGH (ref 11.5–15.5)
WBC: 15.4 10*3/uL — ABNORMAL HIGH (ref 4.0–10.5)

## 2013-07-27 LAB — ABO/RH: ABO/RH(D): B POS

## 2013-07-27 MED ORDER — VITAMIN K1 10 MG/ML IJ SOLN
10.0000 mg | Freq: Every day | INTRAVENOUS | Status: AC
  Administered 2013-07-27 – 2013-07-29 (×3): 10 mg via INTRAVENOUS
  Filled 2013-07-27 (×3): qty 1

## 2013-07-27 NOTE — Progress Notes (Signed)
I have seen and examined this patient. I have discussed with Dr Williamson.  I agree with their findings and plans as documented in their progress note.    

## 2013-07-27 NOTE — Progress Notes (Signed)
Pt is receiving second unit of blood, no complication, vitals within limits. Report given to the next oncoming nurse. Colleen Olson

## 2013-07-27 NOTE — Progress Notes (Signed)
Family Medicine Teaching Service Daily Progress Note Intern Pager: [phone]  Patient name: Colleen Olson Medical record number: [id number] Date of birth: 11/20/1960 Age: 52 y.o. Gender: female  Primary Care Provider: Rodman Pickle, MD Consultants: Nephrology Code Status: DNR/DNI   Pt Overview and Major Events to Date:  07/18/13 - pt admitted with AMS, severe AKI 07/19/13 - blood cx grow GPC start anti-bx, consult renal, plan to attempt LP 12/20 --> 12/21 - CCM Primary 12/22 - Improved mental status, Hgb 7.1--8.4 12/23 - Hgb >> 7.4; not a candidate for GI intervention; Palliative care consulted 12/24 - Palliative Care meeting, pt DNR/DNI 12/25 - Continued drop in platelets (120 to 59 over one week) 12/26 - Addition of Rifaximin for Hepatic Encephalopathy  12/27 - Hgb 6.9, tranfuse 2units PRBC  Assessment and Plan: Colleen Olson is a 52 y.o. female presenting with AMS and acute changes in functional status x 4 days, ultimately found to have sepsis from bacteremia (MRSA), Acute Renal Failure, and UTI (E.Coli). PMH is significant for Cirrhosis 2/2 NASH and ETOH abuse (pt DOES NOT HAVE HEP C AS RNA NEG), Asthma, HTN, Borderline DM, HA, Arthritis, Chronic back pain  ID: # Sepsis, with MRSA Bacteremia with continued worsening leukocytosis # UTI, E.Coli Initially unclear etiology, considered GI source (US revealed minimal ascites), considered pulmonary etiology with suspected atelectasis vs consolidation. No obvious skin lesions or ulcers. Previously received empiric therapy for possible meningitis, LP unable to be obtained. Confirmed UTI with E.Coli, however not source of bacteremia. - (12/18) Blood culture - positive MRSA - c/s ID, greatly appreciate all recs    - Continue Vancomycin (Day 8) and Ampicillin (Day 8)     - repeat blood cultures (12/20) - NGTD    - No vital sign instability, lactic acid 06-23-13 normal    - CT abdomen with possible pancreatitis, lipase elevated as  well  Neuro: # Acute Encephalopathy, in setting of cirrhosis, suspected secondary sepsis with bacteremia / ARF Unclear mental status at baseline, concern for hepatic encephalopathy with ammonia labile (52 >>91 >>52 >> .Marland KitchenMarland Kitchen)  Initial work-up negative with Head CT, considered meningitis however since determined less likely. Overall suspected primary etiology from bacteremia / acute renal failure. - Interactive today but not oriented X4. - continue lactulose,  - Addition of Rifaximin for hepatic encephalopathy  GI: # Cirrhosis, secondary to chronic alcohol use (continued); acute pancreatitis On admission, reportedly saw Hepatologist about 3 weeks ago. No recent ingestions per daughter. Ammonia 52 in ED, and initially scleral icterus noted on exam. Pt on lasix and spirnolactone at home. No evidence of bruising or purpura on admission. UDS (+opiates only).   - trend LFTs, Coags  Lab Results  Component Value Date   ALT 46* 07/26/2013   AST 101* 07/26/2013   ALKPHOS 163* 07/26/2013   BILITOT 6.2* 07/26/2013     - increasing INR 1.99-->2.22>> 2.43 (suggestive of hepatic synthetic fxn failure) - lactulose BID, titrate to BMs, strict i/os and daily weights - Patient is not a liver transplant candidate due to continued alcohol use - CT of the abdomen on 12/23 negative for acute abscess however concerning for possible pancreatitis  Heme: # Acute on Chronic Anemia, Microcytic, concern for acute blood loss anemia 2/2 UGI Bleed On admission, reported significant event of bloody production from mouth / nose. Worsening of chronic microcytic anemia, positive FOBT in Oct 2014 and likely to have upper GI bleed now in setting of increasing INR > 2.0. FOBT (positive 07/19/13) Currently no active signs  of bleeding, no hematemesis, no hematochezia - b/l Hgb 9 - 10 - decreasing Hgb trend 8.1-->7.6-->7.5-->7.1-->8.4 -- > 7.4 --> 7.5>7.3>>7.1--> 6.9 - Continue Protonix 40mg  IV BID - Given hgb 6.9 transfusion of 2  units daily, also give vitamin K to see if liver has any synthetic capacity left, although evidence of up-trending INR seems unlikely - GI reports not a candidate for intervention, palliative care recommend DNR/DNI  #Thrombocytopenia - Pt platelet count has dropped from 120 to 60 currently - Holding anticoagulation or antiplatelet medications - HITT panel and peripheral smear today - Will add on DIC panel to evaluate as well   # AKI, suspected pre-renal etiology -Resolved  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  H/o baseline Cr 0.7, with significant increase to 5.0 on admission - improving Cr trend 4.34-->3.85-->3.52-->2.20 --> 0.89 --> 0.81 --> 0.64 - Good UOP  - continue to hold spironolactone / lasix, nephrotoxic meds C/s Nephrology, greatly appreciate all recommendations   - stopped IVF bicarb / albumin. initially treated with IVF isotonic bicarb, Albumin --> improved BP and UOP   - Would not offer HD (no impact on survival, if not a transplant candidate)   - continue to trend  # Hyperkalemia - Resolved # Hyponatremia - Resolved # Metabolic Acidosis - Resolved - initially received bicarb gtt, since stopped with normalized metabolic disturbances  Resp: # Asthma on O2- On 2LNC at home -cont albuterol prn  -home o2 requirement  -pulse ox continuous    # Right knee pain- s/p TKR. No acute exacerbating factors. Pt daughter report has not ambulated since surgery.  - Restarting oxycodone home dosing as pt c/o pain and mentation much improved   FEN/GI: NPO, cont NS at 125 ml/hr PPx: INR > 2 and platelets 60s, no anticoagulant  Disposition: Patient admitted to floor, transferred to SDU and then to the ICU, managed by PCCM 12/20 to 12/21, transferred back to floor status. Overall continued work-up and medical stabilization to determine outcome and future plan in patient with poor long-term prognosis.  Palliative Care consulted, now  DNR  Subjective: Pt more awake today, denies pain, only itching on her back; she is does not answer questions appropriately  Objective: Temp:  [98.3 F (36.8 C)-100 F (37.8 C)] 98.8 F (37.1 C) (12/27 0630) Pulse Rate:  [106-111] 111 (12/27 0630) Resp:  [20-24] 20 (12/27 0140) BP: (120-136)/(56-74) 134/56 mmHg (12/27 0630) SpO2:  [95 %-98 %] 95 % (12/27 0630) Physical Exam: General: obese, tangential speech / conversation, NAD HEENT: NCAT, mild scleral icterus, MMM Cardiovascular: RRR, no murmurs heard Respiratory: CTA B/L  Abdomen: obese, soft, NTND, +active BS Extremities: WWP, b/l LE +1-2 pitting edema, with evidence of TKR on left Skin: no evidence of bruising or bleeding Neuro: awake, and alert, Oriented x 3 this AM   Laboratory:  Recent Labs Lab 07/25/13 0640 07/26/13 0440 07/26/13 1137 07/27/13 0652  WBC 19.1* 18.9*  --  15.4*  HGB 7.3* 7.1*  --  6.9*  HCT 20.6* 20.0*  --  19.1*  PLT 61* 59* 68* 94*    Recent Labs Lab 07/24/13 0523 07/25/13 0640 07/26/13 0440 07/27/13 0652  NA 136 134* 134* 129*  K 3.7 4.0 3.8 4.3  CL 104 103 103 103  CO2 25 24 25 23   BUN 14 12 12 10   CREATININE 0.64 0.61 0.63 0.57  CALCIUM 8.3* 8.3* 8.3* 8.1*  PROT 6.5  --  6.5  --   BILITOT 4.5*  --  6.2*  --  ALKPHOS 157*  --  163*  --   ALT 47*  --  46*  --   AST 123*  --  101*  --   GLUCOSE 97 94 103* 87    C.Diff (negative) Urine Culture (E.Coli 25,000CFU - pan-sensitive)  Blood Cultures (12/18) MRSA+ Repeat Blood Cultures (12/20) NGTD  Imaging/Diagnostic Tests:  12/16 Knee Xray IMPRESSION:  No acute fracture or subluxation. Stable postsurgical changes. Mild  degenerative changes.  12/18 Hip Xray IMPRESSION:  Normal exam.  12/18 Head CT w/o contrast IMPRESSION:  Study within normal limits.  12/18 Chest Xray 1v FINDINGS:  There is no edema or consolidation. Heart is mildly enlarged with  normal pulmonary vascularity. No appreciable adenopathy. There is   persistent mild elevation the right hemidiaphragm.  IMPRESSION:  Cardiac enlargement, stable. No edema or consolidation.  12/20 Repeat Portable CXR IMPRESSION:  The findings are consistent with atelectasis or developing pneumonia  diffusely on the left and in the right upper lobe. This may reflect  the clinically suspected aspiration.   Garnetta Buddy, MD 07/27/2013, 9:00 AM PGY-3, [facility] Family Medicine FPTS Intern pager: [phone] Amion password: [email or web address], text pages welcome

## 2013-07-27 NOTE — Progress Notes (Signed)
CRITICAL VALUE ALERT  Critical value received:  Hgb 6.9    Date of notification:  07/27/13    Time of notification: 0754  Critical value read back:yes  Nurse who received alert:  Alphonzo Dublin RN  MD notified (1st page):  Family Medicine Practice  Time of first page: [phone]  MD notified (2nd page):  Time of second page:  Responding MD: Family Medicine Practice MD  Time MD responded:  [phone]

## 2013-07-28 DIAGNOSIS — Z9181 History of falling: Secondary | ICD-10-CM

## 2013-07-28 LAB — GLUCOSE, CAPILLARY
Glucose-Capillary: 89 mg/dL (ref 70–99)
Glucose-Capillary: 103 mg/dL — ABNORMAL HIGH (ref 70–99)
Glucose-Capillary: 116 mg/dL — ABNORMAL HIGH (ref 70–99)
Glucose-Capillary: 137 mg/dL — ABNORMAL HIGH (ref 70–99)

## 2013-07-28 LAB — CBC
HCT: 24.7 % — ABNORMAL LOW (ref 36.0–46.0)
Hemoglobin: 8.7 g/dL — ABNORMAL LOW (ref 12.0–15.0)
MCH: 28.2 pg (ref 26.0–34.0)
MCH: 28.4 pg (ref 26.0–34.0)
MCHC: 35.2 g/dL (ref 30.0–36.0)
MCV: 79.9 fL (ref 78.0–100.0)
MCV: 81 fL (ref 78.0–100.0)
Platelets: 131 10*3/uL — ABNORMAL LOW (ref 150–400)
RBC: 2.32 MIL/uL — ABNORMAL LOW (ref 3.87–5.11)
RDW: 18.8 % — ABNORMAL HIGH (ref 11.5–15.5)
RDW: 19.8 % — ABNORMAL HIGH (ref 11.5–15.5)
WBC: 20.2 10*3/uL — ABNORMAL HIGH (ref 4.0–10.5)

## 2013-07-28 LAB — BASIC METABOLIC PANEL
BUN: 10 mg/dL (ref 6–23)
Creatinine, Ser: 0.54 mg/dL (ref 0.50–1.10)
GFR calc non Af Amer: >90 mL/min (ref >90)
Glucose, Bld: 100 mg/dL — ABNORMAL HIGH (ref 70–99)
Potassium: 4.3 mEq/L (ref 3.5–5.1)
Sodium: 127 mEq/L — ABNORMAL LOW (ref 135–145)

## 2013-07-28 MED ORDER — SPIRONOLACTONE 100 MG PO TABS: 100.0000 mg | ORAL_TABLET | Freq: Every day | ORAL | Status: DC

## 2013-07-28 MED ORDER — SPIRONOLACTONE 50 MG PO TABS
50.0000 mg | ORAL_TABLET | Freq: Every day | ORAL | Status: DC
  Administered 2013-07-28 – 2013-07-29 (×2): 50 mg via ORAL
  Filled 2013-07-28 (×2): qty 1

## 2013-07-28 MED ORDER — FUROSEMIDE 20 MG PO TABS
10.0000 mg | ORAL_TABLET | Freq: Every day | ORAL | Status: DC
  Filled 2013-07-28: qty 0.5

## 2013-07-28 MED ORDER — WHITE PETROLATUM GEL
Status: AC
  Administered 2013-07-28: 21:00:00
  Filled 2013-07-28: qty 5

## 2013-07-28 MED ORDER — LACTULOSE 10 GM/15ML PO SOLN
20.0000 g | Freq: Every day | ORAL | Status: DC
  Administered 2013-07-29: 20 g via ORAL
  Filled 2013-07-28: qty 30

## 2013-07-28 MED ORDER — FUROSEMIDE 20 MG PO TABS
20.0000 mg | ORAL_TABLET | Freq: Every day | ORAL | Status: DC
  Administered 2013-07-28 – 2013-07-29 (×2): 20 mg via ORAL
  Filled 2013-07-28 (×2): qty 1

## 2013-07-28 NOTE — Progress Notes (Addendum)
cCRITICAL VALUE ALERT  Critical value received: HGB 6.6  Date of notification:  [id number]  Time of notification:  0945  Critical value read back:yes  Nurse who received alert:  Olevia Perches  MD notified (1st page):  Family Medicine Resident  Time of first page:  [phone]  MD notified (2nd page):  Time of second page:  Responding MD:  Dr. Michail Jewels Time MD responded:  [phone]

## 2013-07-28 NOTE — Progress Notes (Signed)
Family Medicine Teaching Service Daily Progress Note Intern Pager: [phone]  Patient name: Colleen Olson Medical record number: [id number] Date of birth: 10-02-1960 Age: 52 y.o. Gender: female  Primary Care Provider: Rodman Pickle, MD Consultants: Nephrology Code Status: DNR/DNI   Pt Overview and Major Events to Date:  07/18/13 - pt admitted with AMS, severe AKI 07/19/13 - blood cx grow GPC start anti-bx, consult renal, plan to attempt LP 12/20 --> 12/21 - CCM Primary 12/22 - Improved mental status, Hgb 7.1--8.4 12/23 - Hgb >> 7.4; not a candidate for GI intervention; Palliative care consulted 12/24 - Palliative Care meeting, pt DNR/DNI 12/25 - Continued drop in platelets (120 to 59 over one week) 12/26 - Addition of Rifaximin for Hepatic Encephalopathy  12/27 - Hgb 6.9, tranfuse 2units PRBC 12/28 - Hgb 8.7; Vanc Day 10; Rectal tube & Ucath d/c'd; Lasix & Spironolactone restarted @ 1/2 Home    Assessment and Plan: Colleen Olson is a 52 y.o. female presenting with AMS and acute changes in functional status x 4 days, ultimately found to have sepsis from bacteremia (MRSA), Acute Renal Failure, and UTI (E.Coli). PMH is significant for Cirrhosis 2/2 NASH and ETOH abuse (pt DOES NOT HAVE HEP C AS RNA NEG), Asthma, HTN, Borderline DM, HA, Arthritis, Chronic back pain  ID: # Sepsis, with MRSA Bacteremia with continued worsening leukocytosis # UTI, E.Coli Initially unclear etiology, considered GI source (US revealed minimal ascites), considered pulmonary etiology with suspected atelectasis vs consolidation. No obvious skin lesions or ulcers. Previously received empiric therapy for possible meningitis, LP unable to be obtained. Confirmed UTI with E.Coli, however not source of bacteremia. - (12/18) Blood culture - positive MRSA - c/s ID, greatly appreciate all recs    - repeat blood cultures (12/20) - NGx5days    - No vital sign instability, lactic acid normal    - CT abdomen with  possible pancreatitis, lipase elevated as well    - Continue Vancomycin (Day 10); Ampicillin d/c'd  - WBC: remains elevated 19.1 (12/28); Tachycardia but Afeb > 48 hrs  Neuro: # Acute Encephalopathy, in setting of cirrhosis, suspected secondary sepsis with bacteremia / ARF Unclear mental status at baseline, concern for hepatic encephalopathy with ammonia labile (52 >>91 >>52 >> .Marland KitchenMarland Kitchen)  Initial work-up negative with Head CT, considered meningitis however since determined less likely. Overall suspected primary etiology from bacteremia / acute renal failure. - lactulose: decreased to qd - Rectal tube d/c'd 12/28  - Rifaximin for hepatic encephalopathy  GI: # Cirrhosis, secondary to chronic alcohol use (continued); acute pancreatitis On admission, reportedly saw Hepatologist about 3 weeks ago. No recent ingestions per daughter. Ammonia 52 in ED, and initially scleral icterus noted on exam. Pt on lasix and spirnolactone at home. No evidence of bruising or purpura on admission. UDS (+opiates only).  - trend LFTs, Coags  - increasing INR 1.99-->2.43 (suggestive of hepatic synthetic fxn failure) - CT of the abdomen on 12/23 negative for acute abscess however concerning for possible pancreatitis - Patient is not a liver transplant candidate due to continued alcohol use  Heme: # Acute on Chronic Anemia, Microcytic, concern for acute blood loss anemia 2/2 UGI Bleed On admission, reported significant event of bloody production from mouth / nose. Worsening of chronic microcytic anemia, positive FOBT in Oct 2014 and likely to have upper GI bleed now in setting of increasing INR > 2.0. FOBT (positive 07/19/13) Currently no active signs of bleeding, no hematemesis, no hematochezia -  Hgb 8.7 s/p 2u RBC on 12/27 (  Baseline Hgb 9 - 10) - Continue Protonix 40mg  IV BID - Give vitamin K to see if liver has any synthetic capacity left, although evidence of up-trending INR seems unlikely - GI reports not a candidate  for intervention, palliative care recommend DNR/DNI  # Hyponatremia - Likely due to cirrhosis - Restarted Lasix and spironolactone @ 1/2 home dose as AKI resolved   #Thrombocytopenia - POA & Stable now - Holding anticoagulation or antiplatelet medications - HITT panel pending  - DIC panel: Fibrinogen wnl & peripheral smear neg for schistocytes  - PT/INR today (INR 2.43 on 12/26)  # AKI, suspected pre-renal etiology -Resolved  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  - Cr 0.54 (12/28); with significant increase to 5.0 on admission - Good UOP  - continue to hold spironolactone / lasix, nephrotoxic meds C/s Nephrology, greatly appreciate all recommendations   - stopped IVF bicarb / albumin. initially treated with IVF isotonic bicarb, Albumin --> improved BP and UOP   - Would not offer HD (no impact on survival, if not a transplant candidate)  # Asthma on O2- On 2LNC at home -cont albuterol prn  -home o2 requirement  -pulse ox continuous    # Hyperkalemia - Resolved # Hyponatremia - Resolved # Metabolic Acidosis - Resolved - initially received bicarb gtt, since stopped with normalized metabolic disturbances  # Right knee pain- s/p TKR. No acute exacerbating factors. Pt daughter report has not ambulated since surgery.  - oxycodone 10mg  q 4hrs prn  FEN/GI: Diet Dys 3, advance as tolerated; D50 @ 25 ml/hr PPx: INR > 2 and platelets 60s, no anticoagulant  Disposition: Patient admitted to floor, transferred to SDU and then to the ICU, managed by PCCM 12/20 to 12/21, transferred back to floor status. Overall continued work-up and medical stabilization to determine outcome and future plan in patient with poor long-term prognosis. Palliative Care consulted, now DNR/DNI  Subjective: Pt awake, complains to shoulder/neck pain and difficulty sleeping due to this, she answerd questions appropriately  Objective: Temp:  [97.6 F (36.4 C)-99.9 F (37.7 C)]  98.3 F (36.8 C) (12/28 0600) Pulse Rate:  [106-118] 109 (12/28 0600) Resp:  [2-20] 16 (12/28 0600) BP: (92-145)/(56-94) 124/59 mmHg (12/28 0600) SpO2:  [97 %-100 %] 100 % (12/28 0600) Physical Exam: General: obese, tangential speech / conversation, NAD HEENT: mild scleral icterus, MMM Cardiovascular: RRR, no murmurs heard Respiratory: CTA B/L  Abdomen: obese, soft, NTND, +active BS Extremities: WWP, b/l LE +1-2 pitting edema, with evidence of TKR on left Skin: no evidence of bruising or bleeding Neuro: awake, and alert, Oriented x 3 this AM   Laboratory:  Recent Labs Lab 07/26/13 0440 07/26/13 1137 07/27/13 0652 07/28/13 0519  WBC 18.9*  --  15.4* 19.1*  HGB 7.1*  --  6.9* 8.7*  HCT 20.0*  --  19.1* 24.7*  PLT 59* 68* 94* 118*    Recent Labs Lab 07/24/13 0523  07/26/13 0440 07/27/13 0652 07/28/13 0519  NA 136  < > 134* 129* 127*  K 3.7  < > 3.8 4.3 4.3  CL 104  < > 103 103 98  CO2 25  < > 25 23 22   BUN 14  < > 12 10 10   CREATININE 0.64  < > 0.63 0.57 0.54  CALCIUM 8.3*  < > 8.3* 8.1* 8.2*  PROT 6.5  --  6.5  --   --   BILITOT 4.5*  --  6.2*  --   --  ALKPHOS 157*  --  163*  --   --   ALT 47*  --  46*  --   --   AST 123*  --  101*  --   --   GLUCOSE 97  < > 103* 87 100*  < > = values in this interval not displayed.  C.Diff (negative) Urine Culture (E.Coli 25,000CFU - pan-sensitive)  Blood Cultures (12/18) MRSA+ Repeat Blood Cultures (12/20) NGTD  Imaging/Diagnostic Tests:  12/16 Knee Xray IMPRESSION:  No acute fracture or subluxation. Stable postsurgical changes. Mild  degenerative changes.  12/18 Hip Xray IMPRESSION:  Normal exam.  12/18 Head CT w/o contrast IMPRESSION:  Study within normal limits.  12/18 Chest Xray 1v FINDINGS:  There is no edema or consolidation. Heart is mildly enlarged with  normal pulmonary vascularity. No appreciable adenopathy. There is  persistent mild elevation the right hemidiaphragm.  IMPRESSION:  Cardiac  enlargement, stable. No edema or consolidation.  12/20 Repeat Portable CXR IMPRESSION:  The findings are consistent with atelectasis or developing pneumonia  diffusely on the left and in the right upper lobe. This may reflect  the clinically suspected aspiration.   Wenda Low, MD 07/28/2013, 9:02 AM PGY-3, [facility] Family Medicine FPTS Intern pager: [phone] Amion password: [email or web address], text pages welcome

## 2013-07-28 NOTE — Progress Notes (Signed)
After multiple attempts to contact lab and/or phlebotomy services to inquire about delay and cancellation for H&H collection ordered for 0300, lab finally answered and stated that order was automatically cancelled since another order was previously placed for CBC in the morning. Explained to them this was post transfusion lab collection and has to be done ASAP. Per lab, phlebotomists is on their way. Consulting civil engineer notified

## 2013-07-28 NOTE — Progress Notes (Signed)
Family Medicine Teaching Service Daily Progress Note Intern Pager: [phone]  Patient name: Colleen Olson Medical record number: [id number] Date of birth: July 09, 1961 Age: 52 y.o. Gender: female  Primary Care Provider: Rodman Pickle, MD Consultants: Nephrology Code Status: DNR/DNI   Pt Overview and Major Events to Date:  07/18/13 - pt admitted with AMS, severe AKI 07/19/13 - blood cx grow GPC start anti-bx, consult renal, plan to attempt LP 12/20 --> 12/21 - CCM Primary 12/22 - Improved mental status, Hgb 7.1--8.4 12/23 - Hgb >> 7.4; not a candidate for GI intervention; Palliative care consulted 12/24 - Palliative Care meeting, pt DNR/DNI 12/25 - Continued drop in platelets (120 to 59 over one week) 12/26 - Addition of Rifaximin for Hepatic Encephalopathy  12/27 - Hgb 6.9, tranfuse 2units PRBC 12/28 - Hgb 8.7; Vanc Day 10; Rectal tube & Ucath d/c'd; Lasix & Spironolactone restarted @ 1/2 Home ; hgb 6.6-> transfusing 2uprbcs 12/29- Palliative consult pending   Assessment and Plan: Colleen Olson is a 52 y.o. female presenting with AMS and acute changes in functional status x 4 days, ultimately found to have sepsis from bacteremia (MRSA), Acute Renal Failure, and UTI (E.Coli). PMH is significant for Cirrhosis 2/2 NASH and ETOH abuse (pt DOES NOT HAVE HEP C AS RNA NEG), Asthma, HTN, Borderline DM, HA, Arthritis, Chronic back pain  ID: # Sepsis, with MRSA Bacteremia with continued worsening leukocytosis # UTI, E.Coli Initially unclear etiology, considered GI source (US revealed minimal ascites), considered pulmonary etiology with suspected atelectasis vs consolidation. No obvious skin lesions or ulcers. Previously received empiric therapy for possible meningitis, LP unable to be obtained. Confirmed UTI with E.Coli, however not source of bacteremia. - (12/18) Blood culture - positive MRSA - c/s ID, greatly appreciate all recs    - repeat blood cultures (12/20) - NGx5days    - No vital  sign instability, lactic acid normal    - CT abdomen with possible pancreatitis, lipase elevated as well    - Continue Vancomycin (Day 11/14); Ampicillin d/c'd  - WBC: remains elevated 20.2 (12/28); Tachycardia but Afeb > 48 hrs  Neuro: # Acute Encephalopathy, in setting of cirrhosis, suspected secondary sepsis with bacteremia / ARF Unclear mental status at baseline, concern for hepatic encephalopathy with ammonia labile (52 >>91 >>52 >> .Marland KitchenMarland Kitchen)  Initial work-up negative with Head CT, considered meningitis however since determined less likely. Overall suspected primary etiology from bacteremia / acute renal failure. - lactulose: decreased to qd - Rectal tube d/c'd 12/28- now with signs of active bleeding from rectum (see below) - Rifaximin for hepatic encephalopathy  GI: # Cirrhosis, secondary to chronic alcohol use (continued); acute pancreatitis On admission, reportedly saw Hepatologist about 3 weeks ago. No recent ingestions per daughter. Ammonia 52 in ED, and initially scleral icterus noted on exam. Pt on lasix and spirnolactone at home. No evidence of bruising or purpura on admission. UDS (+opiates only).  - trend LFTs, Coags  - increasing INR 1.99-->2.43 (suggestive of hepatic synthetic fxn failure) - CT of the abdomen on 12/23 negative for acute abscess however concerning for possible pancreatitis - Patient is not a liver transplant candidate due to continued alcohol use  Heme: # Acute on Chronic Anemia, Microcytic, concern for acute blood loss anemia 2/2 UGI Bleed On admission, reported significant event of bloody production from mouth / nose. Worsening of chronic microcytic anemia, positive FOBT in Oct 2014 and likely to have upper GI bleed now in setting of increasing INR > 2.0. FOBT (positive 07/19/13) D/c rectal  tube and noticed some blood clots from the rectum, currently actively bleeding, with pooled blood and diarrhea this morning, labs with evidence of hemodynamic instability  -   Hgb 8.7 s/p 2u RBC on 12/27 (Baseline Hgb 9 - 10) --> 6.6 on 12/28 at night, transfusing again 2u post transfusion cbc pending - Continue Protonix 40mg  IV BID - Give vitamin K to see if liver has any synthetic capacity left, although evidence of up-trending INR seems unlikely - GI reports not a candidate for intervention, palliative care recommend DNR/DNI - pending palliative discussion today to clarify GOC  # Hyponatremia Likely due to cirrhosis - Restarted Lasix and spironolactone @ 1/2 home dose as AKI resolved  -AM BMET pending  #Thrombocytopenia - POA & Stable now - Holding anticoagulation or antiplatelet medications - HITT panel pending  - DIC panel: Fibrinogen wnl & peripheral smear neg for schistocytes  - f/up PT/INR (INR 2.43 on 12/26)  # AKI, suspected pre-renal etiology -Resolved  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  - Cr 0.54 (12/28); with significant increase to 5.0 on admission - Good UOP  - continue to hold spironolactone / lasix, nephrotoxic meds C/s Nephrology, greatly appreciate all recommendations   - stopped IVF bicarb / albumin. initially treated with IVF isotonic bicarb, Albumin --> improved BP and UOP   - Would not offer HD (no impact on survival, if not a transplant candidate)  # Asthma on O2- On 2LNC at home -cont albuterol prn  -home o2 requirement  -pulse ox continuous    # Hyperkalemia - Resolved # Hyponatremia - Resolved # Metabolic Acidosis - Resolved - initially received bicarb gtt, since stopped with normalized metabolic disturbances # Right knee pain- s/p TKR. No acute exacerbating factors. Pt daughter report has not ambulated since surgery.  - oxycodone 10mg  q 4hrs prn  FEN/GI: Diet Dys 3, advance as tolerated; D50 @ 25 ml/hr PPx: INR > 2 and platelets 60s, no anticoagulant  Disposition Palliative Care consulted, now DNR/DNI, plan for meeting 12/29 for hospice discussion  Subjective:Pt awake but confused  (seems like her baseline); No current complaints   Objective: Temp:  [97.6 F (36.4 C)-99 F (37.2 C)] 99 F (37.2 C) (12/28 2139) Pulse Rate:  [109-123] 121 (12/28 2139) Resp:  [16-20] 18 (12/28 2139) BP: (96-145)/(49-82) 106/49 mmHg (12/28 2139) SpO2:  [97 %-100 %] 100 % (12/28 2139) Physical Exam: General: obese, minimally conversive NAD HEENT: mild scleral icterus, MMM Cardiovascular: RRR, no murmurs heard Respiratory: CTA B/L  Abdomen: obese, soft, NTND, +active BS, evidence of bloody diarrhea in the bed Extremities: WWP, b/l LE +2 pitting edema up to mid thigh, with evidence of TKR on left Skin: no evidence of bruising or bleeding Neuro: awake, and alert, oriented to self only  Laboratory:  Recent Labs Lab 07/27/13 0652 07/28/13 0519 07/28/13 2110  WBC 15.4* 19.1* 20.2*  HGB 6.9* 8.7* 6.6*  HCT 19.1* 24.7* 18.8*  PLT 94* 118* 131*    Recent Labs Lab 07/24/13 0523  07/26/13 0440 07/27/13 0652 07/28/13 0519  NA 136  < > 134* 129* 127*  K 3.7  < > 3.8 4.3 4.3  CL 104  < > 103 103 98  CO2 25  < > 25 23 22   BUN 14  < > 12 10 10   CREATININE 0.64  < > 0.63 0.57 0.54  CALCIUM 8.3*  < > 8.3* 8.1* 8.2*  PROT 6.5  --  6.5  --   --  BILITOT 4.5*  --  6.2*  --   --   ALKPHOS 157*  --  163*  --   --   ALT 47*  --  46*  --   --   AST 123*  --  101*  --   --   GLUCOSE 97  < > 103* 87 100*  < > = values in this interval not displayed.  C.Diff (negative) Urine Culture (E.Coli 25,000CFU - pan-sensitive)  Blood Cultures (12/18) MRSA+ Repeat Blood Cultures (12/20) NGTD  Imaging/Diagnostic Tests:  12/16 Knee Xray IMPRESSION:  No acute fracture or subluxation. Stable postsurgical changes. Mild  degenerative changes.  12/18 Hip Xray IMPRESSION:  Normal exam.  12/18 Head CT w/o contrast IMPRESSION:  Study within normal limits.  12/18 Chest Xray 1v FINDINGS:  There is no edema or consolidation. Heart is mildly enlarged with  normal pulmonary vascularity. No  appreciable adenopathy. There is  persistent mild elevation the right hemidiaphragm.  IMPRESSION:  Cardiac enlargement, stable. No edema or consolidation.  12/20 Repeat Portable CXR IMPRESSION:  The findings are consistent with atelectasis or developing pneumonia  diffusely on the left and in the right upper lobe. This may reflect  the clinically suspected aspiration.   Anselm Lis, MD 07/28/2013, 10:43 PM PGY-1, [facility] Family Medicine FPTS Intern pager: [phone] Amion password: [email or web address], text pages welcome

## 2013-07-28 NOTE — Progress Notes (Signed)
Flexi seal rectal tube removed; patient has been passing occasional loose stool around the tube; patient feels relieved to have it removed; explained freq. Skin care and cleansing will be done without use of the rectal tube.

## 2013-07-28 NOTE — Progress Notes (Signed)
FMTS Attending Daily Note: Katalia Choma MD [phone] pager office [phone] I  have seen and examined this patient, reviewed their chart. I have discussed this patient with the resident. I agree with the resident's findings, assessment and care plan. 

## 2013-07-28 NOTE — Progress Notes (Signed)
Transfusion completed, no adverse reaction, VSS, pt resting. Order for hemoglobin and hematocrit draw placed.

## 2013-07-28 NOTE — Progress Notes (Signed)
Patient cleansed for incontinence of loose stool; not a large amount of stool but has blood clots on the underpad; will notify MD.

## 2013-07-28 NOTE — Discharge Summary (Signed)
Family Medicine Teaching [facility] Discharge Summary  Patient name: Colleen Olson Medical record number: [id number] Date of birth: September 02, 1960 Age: 52 y.o. Gender: female Date of Admission: 07/18/2013  Date of Discharge: 07/31/2013 Admitting Physician: Barbaraann Barthel, MD  Primary Care Provider: Rodman Pickle, MD Consultants: palliative care, hospice, nephology, infectious disease, critical care   Indication for Hospitalization: Delirium due to MRSA bacteremia  Discharge Diagnoses/Problem List:  1. Sepsis: MRSA 2. UTI: E coli 3. Cirrhosis w/ hepatic encephalopathy 4. AKI - Resolved 5. Hypernatremia 6. Thrombocytopenia 7. Anemia - s/p 2u RBC transfusion  Disposition: Psychologist, sport and exercise [facility])  Discharge Condition: Guarded   Discharge Exam:  BP 112/51  Pulse 118  Temp(Src) 97.6 F (36.4 C) (Oral)  Resp 18  Ht 5\' 8"  (1.727 m)  Wt 323 lb 10.2 oz (146.8 kg)  BMI 49.22 kg/m2  SpO2 100% General: obese, not conversive, has gown pulled up over stomach  HEENT: mild scleral icterus, MMM  Cardiovascular: RRR  Respiratory: CTA B/L  Abdomen: obese, soft, NTND, +active BS  Extremities: WWP, b/l LE +2 pitting edema up to mid thigh  Skin: warm, dry  Neuro: awake, and alert, does not speak with me this morning  Brief Hospital Course: Colleen Olson is a 52 y.o. female who presenting with delirium and acute changes in functional status x 4 days, ultimately found to have sepsis from bacteremia (MRSA), Acute Renal Failure, and UTI (E.Coli). PMH is significant for Cirrhosis 2/2 NASH and ETOH abuse (pt does not have Hep C as RNA negative), Asthma, HTN, Borderline DM, HA, Arthritis, Chronic back pain. She was treated with Vanc and Zosyn for sepsis. GI was consulted for hepatic encephalopathy and she was started on lactulose and Rifaximin, but was not a candidate for liver transplant due to recent alcohol use. Palliative care was consulted due to terminal nature or her progressing liver  failure. Ultimately, decision was made to withdraw all medical management except for symptom control and patient was transferred to [facility] (hospice[facility] for further care.   Major Events during hospital course:  07/18/13 - pt admitted with altered mental status, severe AKI  07/19/13 - blood cx grow GPC start anti-bx, consult renal, plan to attempt LP (failed despite fluoro) 07/20/13 - 12/21 - Critical Care Medicine Primary  12/22 - Improved mental status, Hgb 7.1--8.4  12/23 - Hgb >> 7.4; not a candidate for GI intervention; Palliative care consulted  12/24 - Palliative Care meeting, pt DNR/DNI  12/25 - Continued drop in platelets (120 to 59 over one week)  12/26 - Addition of Rifaximin for Hepatic Encephalopathy  12/27 - Hgb 6.9, tranfused 2units PRBC  12/28 - Hgb 8.7; Vanc Day 10; Rectal tube & Ucath d/c'd; Lasix & Spironolactone restarted @ 1/2 Home ; hgb 6.6-> transfusing 2uprbcs  12/29 Palliative Care meeting-withdrawal of all non-comfort care medications except for Vancomycin for bacteremia (14 days planned). Labs also stopped at this time.  12/31 - Plan for transfer to [facility] for full comfort care (Vacomycin stopped after day 13 due to transfer)   Infectious disease: Sepsis w/ MRSA Bacteremia (12/18 blood culture)and UTI w/ Ecoli both POA. Unable to obtain LP.  Treated with Vancomycin, Ampicillin and CTX and de-escalated to Vanc with sensitivities. Second blood cultures (12/20) were negative x 5 days, and vitals remained stable. WBC elevation and tachcardia persisted despite treatment. CT abdomen with possible pancreatitis with lipase also elevated.  Ultimately blood draws were discontinued due to moving to comfort care. Patient completed 13 days of  vancomycin before discharge. Plan was for 14 days but due to [facility] bed being available discussed with daughter Tiburcio Pea and plan was to discontinue antibiotics and move to full comfort care. No echocardiogram obtained due to  comfort care.   Cirrhosis w/ hepatic encephalopathy: Secondary to chronic alcohol use, which prevents her from getting on liver transplant list. Improved mental status with Lactulose and Rifaximin. Palliative care consulted due to declining liver function and ultimately decision made to hold the above medications. Hyponatremia developed while diuretics were held for AKI. Lasix and Spironolactone restarted when kidney functin returned to normal but ultimately these were discontinued as patient moved to comfort care.   Anemia: Likely multifactorial: chronic disease and iron deficiency (microcytic) possibly due to blood loss (FOBT positive). Hgb remained trended down to 6.9 on 12/27 and patient received2 units RBCs. Protonix started. Hgb trended back down to 7.5 before blood draws discontinued.   Thrombocytopenia: Likely due to initial sepsis. DIC panel: fibrinogen was normal. HITT panel still pending at time of discharge but unlikely to change course of chronic illness.   AKI: Due to sepsis POA. Resolved.   Asthma-2L [place] at home, continued in house, albuterol prn.   Issues for Follow Up:  Full Comfort Care through [facility] place Diet recommendations-dysphagia 3  Significant Procedures: Lumbar Puncture (unsuccessful even under fluoroscopic guidance)   Significant Labs and Imaging:   Recent Labs Lab 07/26/13 0440 07/26/13 1137 07/27/13 0652 07/28/13 0519  WBC 18.9*  --  15.4* 19.1*  HGB 7.1*  --  6.9* 8.7*  HCT 20.0*  --  19.1* 24.7*  PLT 59* 68* 94* 118*    Recent Labs Lab 07/24/13 0523 07/25/13 0640 07/26/13 0440 07/27/13 0652 07/28/13 0519  NA 136 134* 134* 129* 127*  K 3.7 4.0 3.8 4.3 4.3  CL 104 103 103 103 98  CO2 25 24 25 23 22   GLUCOSE 97 94 103* 87 100*  BUN 14 12 12 10 10   CREATININE 0.64 0.61 0.63 0.57 0.54  CALCIUM 8.3* 8.3* 8.3* 8.1* 8.2*  ALKPHOS 157*  --  163*  --   --   AST 123*  --  101*  --   --   ALT 47*  --  46*  --   --   ALBUMIN 2.0*  --  2.0*  --   --     Dg Chest 1 View  07/18/2013   CLINICAL DATA:  Altered mental status  EXAM: CHEST - 1 VIEW  COMPARISON:  May 17, 2013  FINDINGS: There is no edema or consolidation. Heart is mildly enlarged with normal pulmonary vascularity. No appreciable adenopathy. There is persistent mild elevation the right hemidiaphragm.  IMPRESSION: Cardiac enlargement, stable.  No edema or consolidation.   Electronically Signed   By: Bretta Bang M.D.   On: 07/18/2013 17:40   Dg Hip Complete Left  07/18/2013   CLINICAL DATA:  Left hip pain.  EXAM: LEFT HIP - COMPLETE 2+ VIEW  COMPARISON:  None.  FINDINGS: There is no visible fracture. No dislocation. Pelvic bones are intact as well. The patient was unable to externally rotate the hip.  IMPRESSION: Normal exam.   Electronically Signed   By: Geanie Cooley M.D.   On: 07/18/2013 13:51   Dg Knee 2 Views Left  07/16/2013   CLINICAL DATA:  Anterior knee pain  EXAM: LEFT KNEE - 1-2 VIEW  COMPARISON:  11/30/2012  FINDINGS: Three views of the left knee submitted. No acute fracture or subluxation.  Medial compartment prosthesis is stable. No definite evidence of loosening. There is mild spurring of lateral femoral condyle. Narrowing of patellofemoral joint space. Small joint effusion.  IMPRESSION: No acute fracture or subluxation. Stable postsurgical changes. Mild degenerative changes.   Electronically Signed   By: Natasha Mead M.D.   On: 07/16/2013 07:34   Ct Head Wo Contrast  07/18/2013   CLINICAL DATA:  Altered mental status  EXAM: CT HEAD WITHOUT CONTRAST  TECHNIQUE: Contiguous axial images were obtained from the base of the skull through the vertex without intravenous contrast. Study was obtained within 24 hr of patient's arrival at the emergency department.  COMPARISON:  None.  FINDINGS: The ventricles are normal in size and configuration. There is no mass, hemorrhage, extra-axial fluid collection, or midline shift. The gray-white compartments are normal. No acute infarct  identified. The bony calvarium appears intact. The mastoid air cells are clear.  IMPRESSION: Study within normal limits.   Electronically Signed   By: Bretta Bang M.D.   On: 07/18/2013 17:26   Ct Abdomen Pelvis W Contrast  07/23/2013   CLINICAL DATA:  Abnormal liver function tests.  MRSA bacteremia.  EXAM: CT ABDOMEN AND PELVIS WITH CONTRAST  TECHNIQUE: Multidetector CT imaging of the abdomen and pelvis was performed using the standard protocol following bolus administration of intravenous contrast.  CONTRAST:  OMNIPAQUE IOHEXOL 300 MG/ML  SOLN  COMPARISON:  05/17/2013  FINDINGS: Exam is degraded by patient motion at multiple levels. Trace pleural effusions are noted bilaterally with mild cardiomegaly partly visualized.  Small amount of ascites, fluid tracking to the mesentery, and subcutaneous edema is noted compatible with anasarca. Stones/sludge are noted within the gallbladder dependently with mild gallbladder distention and indistinctness of the pericholecystic fat. There is, however, also mesenteric edema and ascites elsewhere in the abdomen. Nodular hepatic contour which may suggest cirrhosis. Right upper renal pole cortical calcification reidentified versus volume averaging with a nonobstructing calculus. No hydroureteronephrosis or radiopaque left renal or ureteral calculus on either side. Spleen, pancreas, and adrenal glands appear normal. There is no focal fluid collection in the peripancreatic region although there is peripancreatic fluid as elsewhere in the mesentery and abdomen.  The bladder is decompressed with a Foley catheter in place. Uterine contour is lobulated which may suggest fibroids but is not further characterized. No bowel wall thickening or focal segmental dilatation. Normal appendix. No loculated intra-abdominal or pelvic fluid collection. No acute osseous abnormality.  IMPRESSION: Ascites and soft tissue edema most compatible with anasarca/ 3rd spacing. There may also be  an element of ascites given the presence of apparent cirrhosis, which could account for the presence of fluid around the gallbladder as well.  Gallstones and or sludge ; if the patient has right upper quadrant abdominal pain, the constellation of this finding and pericholecystic fluid could be seen with cholecystitis in the appropriate clinical context.  Peripancreatic fluid which again may be due to third spacing/ anasarca but correlation with amylase and lipase could be helpful to exclude pancreatitis.   Electronically Signed   By: Christiana Pellant M.D.   On: 07/23/2013 16:45   Dg Chest Port 1 View  07/20/2013   CLINICAL DATA:  Sepsis, possible aspiration.  EXAM: PORTABLE CHEST - 1 VIEW  COMPARISON:  Chest x-ray dated July 18, 2013  FINDINGS: Since yesterday's study there has developed increased interstitial density within the left lung and in the right upper lobe. The cardiopericardial silhouette remains enlarged. The pulmonary vascularity is indistinct. There is no definite  pleural effusion.  IMPRESSION: The findings are consistent with atelectasis or developing pneumonia diffusely on the left and in the right upper lobe. This may reflect the clinically suspected aspiration.   Electronically Signed   By: David  [place]   On: 07/20/2013 08:36   Dg Fluoro Guide Lumbar Puncture  07/19/2013   CLINICAL DATA:  Altered mental status.  EXAM: DIAGNOSTIC LUMBAR PUNCTURE UNDER FLUOROSCOPIC GUIDANCE  FLUOROSCOPY TIME:  1 min, 27 seconds  PROCEDURE: Informed consent was obtained from the patient prior to the procedure, including potential complications of headache, allergy, and pain. With the patient prone, the lower back was prepped with Betadine. 1% Lidocaine was used for local anesthesia. Lumbar puncture was attempted initially at the L4-5 level using a gauge needle. Despite multiple attempts, this was unsuccessful. Therefore, attempts were made at L3-4, again with unsuccessful return of CSF. At this point, a  cross-table lateral view of the lumbar spine was obtained which demonstrates the medial at the appropriate depth of the spinal canal. Given no return of CSF, a 3rd attempt was made at the L2-3 level, also unsuccessful.  IMPRESSION: Multiple attempts that lumbar puncture with fluoroscopic guidance for unsuccessful less described above. This was discussed with Dr. Clinton Sawyer at the time of the procedure.   Electronically Signed   By: Charlett Nose M.D.   On: 07/19/2013 19:20     Results/Tests Pending at Time of Discharge: HITT Panel  Discharge Medications:    Medication List    STOP taking these medications       calcium carbonate 1250 MG tablet  Commonly known as:  OS-CAL - dosed in mg of elemental calcium     cetirizine 10 MG tablet  Commonly known as:  ZYRTEC     fluticasone 50 MCG/ACT nasal spray  Commonly known as:  FLONASE     furosemide 20 MG tablet  Commonly known as:  LASIX     lactulose 10 GM/15ML solution  Commonly known as:  CHRONULAC     Oxycodone HCl 10 MG Tabs     oxyCODONE-acetaminophen 5-325 MG per tablet  Commonly known as:  PERCOCET/ROXICET     pentoxifylline 400 MG CR tablet  Commonly known as:  TRENTAL     polyethylene glycol packet  Commonly known as:  MIRALAX / GLYCOLAX     potassium chloride SA 20 MEQ tablet  Commonly known as:  K-DUR,KLOR-CON     propranolol 20 MG tablet  Commonly known as:  INDERAL     senna 8.6 MG Tabs tablet  Commonly known as:  SENOKOT     spironolactone 100 MG tablet  Commonly known as:  ALDACTONE     venlafaxine 37.5 MG tablet  Commonly known as:  EFFEXOR      TAKE these medications       albuterol 108 (90 BASE) MCG/ACT inhaler  Commonly known as:  PROAIR HFA  Inhale 2 puffs into the lungs every 6 (six) hours as needed for wheezing or shortness of breath.     cyclobenzaprine 10 MG tablet  Commonly known as:  FLEXERIL  Take 1 tablet (10 mg total) by mouth 2 (two) times daily as needed for muscle spasms.      morphine CONCENTRATE 10 mg / 0.5 ml concentrated solution  Take 0.25 mLs (5 mg total) by mouth every 3 (three) hours as needed for moderate pain, severe pain or shortness of breath.     NUTRITIONAL SHAKE Liqd  Take 1 each by mouth 3 (three) times  daily.       Discharge Instructions: Please refer to Patient Instructions section of EMR for full details.  Patient was counseled important signs and symptoms that should prompt return to medical care, changes in medications, dietary instructions, activity restrictions, and follow up appointments.   Follow-Up Appointments: Not applicable  Shelva Majestic, MD 07/28/2013, 1:35 PM PGY-3, [facility]

## 2013-07-28 NOTE — Progress Notes (Signed)
Cleansed for loose bloody discharge with clots from her rectum; MD notified; not as large an amount as the output stool at 1600 hour.  Patient alert; denies pain; BP improved from last reading documented.  Skin intact at rectum and across the sacral area with no breakdown or external hemorroid.

## 2013-07-29 DIAGNOSIS — K625 Hemorrhage of anus and rectum: Secondary | ICD-10-CM

## 2013-07-29 LAB — CBC
HCT: 21.7 % — ABNORMAL LOW (ref 36.0–46.0)
Hemoglobin: 7.5 g/dL — ABNORMAL LOW (ref 12.0–15.0)
MCH: 28.5 pg (ref 26.0–34.0)
MCHC: 34.6 g/dL (ref 30.0–36.0)
MCV: 82.5 fL (ref 78.0–100.0)
Platelets: 135 10*3/uL — ABNORMAL LOW (ref 150–400)
RBC: 2.63 MIL/uL — ABNORMAL LOW (ref 3.87–5.11)
WBC: 18.8 10*3/uL — ABNORMAL HIGH (ref 4.0–10.5)

## 2013-07-29 LAB — COMPREHENSIVE METABOLIC PANEL
AST: 39 U/L — ABNORMAL HIGH (ref 0–37)
Albumin: 1.5 g/dL — ABNORMAL LOW (ref 3.5–5.2)
Alkaline Phosphatase: 135 U/L — ABNORMAL HIGH (ref 39–117)
BUN: 20 mg/dL (ref 6–23)
CO2: 21 mEq/L (ref 19–32)
Calcium: 7.9 mg/dL — ABNORMAL LOW (ref 8.4–10.5)
Creatinine, Ser: 0.92 mg/dL (ref 0.50–1.10)
GFR calc Af Amer: 80 mL/min — ABNORMAL LOW (ref >90)
Glucose, Bld: 98 mg/dL (ref 70–99)
Sodium: 135 mEq/L (ref 135–145)
Total Bilirubin: 5.4 mg/dL — ABNORMAL HIGH (ref 0.3–1.2)
Total Protein: 5.6 g/dL — ABNORMAL LOW (ref 6.0–8.3)

## 2013-07-29 LAB — GLUCOSE, CAPILLARY
Glucose-Capillary: 106 mg/dL — ABNORMAL HIGH (ref 70–99)
Glucose-Capillary: 113 mg/dL — ABNORMAL HIGH (ref 70–99)
Glucose-Capillary: 126 mg/dL — ABNORMAL HIGH (ref 70–99)
Glucose-Capillary: 95 mg/dL (ref 70–99)

## 2013-07-29 MED ORDER — MORPHINE SULFATE (CONCENTRATE) 10 MG /0.5 ML PO SOLN
5.0000 mg | ORAL | Status: DC | PRN
  Administered 2013-07-29: 5 mg via ORAL
  Filled 2013-07-29: qty 0.5

## 2013-07-29 NOTE — Progress Notes (Signed)
ANTIBIOTIC CONSULT NOTE - FOLLOW UP  Pharmacy Consult for Vancomycin Indication: MRSA bacteremia  No Known Allergies  Patient Measurements: Height: 5\' 8"  (172.7 cm) Weight: 323 lb 10.2 oz (146.8 kg) IBW/kg (Calculated) : 63.9  Vital Signs: Temp: 98.4 F (36.9 C) (12/29 1000) Temp src: Axillary (12/29 1000) BP: 110/48 mmHg (12/29 1000) Pulse Rate: 114 (12/29 1000) Intake/Output from previous day: 12/28 0701 - 12/29 0700 In: 650 [Blood:650] Out: 3 [Stool:3] Intake/Output from this shift: Total I/O In: 325 [Blood:325] Out: -   Labs:  Recent Labs  07/27/13 0652 07/28/13 0519 07/28/13 2110  WBC 15.4* 19.1* 20.2*  HGB 6.9* 8.7* 6.6*  PLT 94* 118* 131*  CREATININE 0.57 0.54  --    Estimated Creatinine Clearance: 123.2 ml/min (by C-G formula based on Cr of 0.54).  Recent Labs  07/26/13 1909  VANCOTROUGH 19.5     Microbiology: Recent Results (from the past 720 hour(s))  CULTURE, BLOOD (ROUTINE X 2)     Status: None   Collection Time    07/18/13  4:55 PM      Result Value Range Status   Specimen Description BLOOD ARM RIGHT   Final   Special Requests BOTTLES DRAWN AEROBIC AND ANAEROBIC 10CC   Final   Culture  Setup Time     Final   Value: 07/19/2013 02:04     Performed at [facility]   Culture     Final   Value: METHICILLIN RESISTANT STAPHYLOCOCCUS AUREUS     Note: RIFAMPIN AND GENTAMICIN SHOULD NOT BE USED AS SINGLE DRUGS FOR TREATMENT OF STAPH INFECTIONS. CRITICAL RESULT CALLED TO, READ BACK BY AND VERIFIED WITH: [facility] MACAFERO 07/21/13 @ 9:11AM BY RUSCOE A.     Note: Gram Stain Report Called to,Read Back By and Verified With: ASHLEY LARSON 07/19/13 1030 BY SMITHERSJ     Performed at [facility]   Report Status 07/21/2013 FINAL   Final   Organism ID, Bacteria METHICILLIN RESISTANT STAPHYLOCOCCUS AUREUS   Final  CULTURE, BLOOD (ROUTINE X 2)     Status: None   Collection Time    07/18/13  5:05 PM      Result Value Range Status   Specimen  Description BLOOD HAND LEFT   Final   Special Requests BOTTLES DRAWN AEROBIC ONLY 10CC   Final   Culture  Setup Time     Final   Value: 07/19/2013 02:03     Performed at [facility]   Culture     Final   Value: METHICILLIN RESISTANT STAPHYLOCOCCUS AUREUS     Note: SUSCEPTIBILITIES PERFORMED ON PREVIOUS CULTURE WITHIN THE LAST 5 DAYS.     Note: Gram Stain Report Called to,Read Back By and Verified With: JANET BEASLEY 07/19/13 1035 BY SMITHERSJ     Performed at [facility]   Report Status 07/21/2013 FINAL   Final  MRSA PCR SCREENING     Status: Abnormal   Collection Time    07/19/13  2:37 PM      Result Value Range Status   MRSA by PCR POSITIVE (*) NEGATIVE Final   Comment:            The GeneXpert MRSA Assay (FDA     approved for NASAL specimens     only), is one component of a     comprehensive MRSA colonization     surveillance program. It is not     intended to diagnose MRSA     infection nor to guide  or     monitor treatment for     MRSA infections.     RESULT CALLED TO, READ BACK BY AND VERIFIED WITH:     C. SCHILLER RN 15:50 07/19/13 ([email or web address])  URINE CULTURE     Status: None   Collection Time    07/19/13  4:15 PM      Result Value Range Status   Specimen Description URINE, CATHETERIZED   Final   Special Requests NONE   Final   Culture  Setup Time     Final   Value: 07/19/2013 21:45     Performed at [facility] Count     Final   Value: 25,000 COLONIES/ML     Performed at [facility]   Culture     Final   Value: ESCHERICHIA COLI     Performed at [facility]   Report Status 07/21/2013 FINAL   Final   Organism ID, Bacteria ESCHERICHIA COLI   Final  CULTURE, BLOOD (ROUTINE X 2)     Status: None   Collection Time    07/20/13 12:20 PM      Result Value Range Status   Specimen Description BLOOD LEFT ARM   Final   Special Requests BOTTLES DRAWN AEROBIC ONLY 10CC   Final   Culture  Setup Time     Final   Value:  07/20/2013 20:21     Performed at [facility]   Culture     Final   Value: NO GROWTH 5 DAYS     Performed at [facility]   Report Status 07/26/2013 FINAL   Final  CLOSTRIDIUM DIFFICILE BY PCR     Status: None   Collection Time    07/21/13  2:17 PM      Result Value Range Status   C difficile by pcr NEGATIVE  NEGATIVE Final    Anti-infectives   Start     Dose/Rate Route Frequency Ordered Stop   07/26/13 1000  rifaximin (XIFAXAN) tablet 550 mg     550 mg Oral 2 times daily 07/26/13 0823     07/25/13 0700  vancomycin (VANCOCIN) 1,500 mg in sodium chloride 0.9 % 500 mL IVPB     1,500 mg 250 mL/hr over 120 Minutes Intravenous Every 12 hours 07/24/13 1538     07/24/13 1545  vancomycin (VANCOCIN) 2,000 mg in sodium chloride 0.9 % 500 mL IVPB     2,000 mg 250 mL/hr over 120 Minutes Intravenous  Once 07/24/13 1538 07/24/13 1808   07/21/13 1230  vancomycin (VANCOCIN) 1,750 mg in sodium chloride 0.9 % 500 mL IVPB  Status:  Discontinued     1,750 mg 250 mL/hr over 120 Minutes Intravenous Every 24 hours 07/21/13 1231 07/24/13 1537   07/21/13 1200  vancomycin (VANCOCIN) 1,750 mg in sodium chloride 0.9 % 500 mL IVPB  Status:  Discontinued     1,750 mg 250 mL/hr over 120 Minutes Intravenous Every 48 hours 07/19/13 1131 07/21/13 1231   07/20/13 0630  ampicillin (OMNIPEN) 2 g in sodium chloride 0.9 % 50 mL IVPB     2 g 150 mL/hr over 20 Minutes Intravenous 3 times per day 07/20/13 0624 07/24/13 2244   07/19/13 1800  ampicillin (OMNIPEN) 2 g in sodium chloride 0.9 % 50 mL IVPB  Status:  Discontinued     2 g 150 mL/hr over 20 Minutes Intravenous Every 12 hours 07/19/13 1214 07/20/13 0616   07/19/13 1600  acyclovir (ZOVIRAX)  320 mg in dextrose 5 % 100 mL IVPB  Status:  Discontinued     320 mg 106.4 mL/hr over 60 Minutes Intravenous Every 24 hours 07/19/13 1214 07/20/13 0327   07/19/13 1400  cefTRIAXone (ROCEPHIN) 2 g in dextrose 5 % 50 mL IVPB  Status:  Discontinued     2 g 100  mL/hr over 30 Minutes Intravenous Every 12 hours 07/19/13 1214 07/20/13 1135   07/19/13 1200  vancomycin (VANCOCIN) 2,500 mg in sodium chloride 0.9 % 500 mL IVPB     2,500 mg 250 mL/hr over 120 Minutes Intravenous  Once 07/19/13 1131 07/19/13 1612   07/18/13 1615  cefTRIAXone (ROCEPHIN) 1 g in dextrose 5 % 50 mL IVPB  Status:  Discontinued     1 g 100 mL/hr over 30 Minutes Intravenous Every 24 hours 07/18/13 1603 07/18/13 1604      Assessment: 52 y.o. F who continues on Vancomycin D#11/14 for MRSA bacteremia. ID on board and planning to treat for 2 weeks. A Vancomycin trough drawn on 12/26 was therapeutic -- renal function has remained stable since that time. Dose remains appropriate for now.  Goal of Therapy:  Vancomycin trough level 15-20 mcg/ml  Plan:  1. Continue Vancomycin 1500 mg IV every 12 hours 2. Will continue to follow renal function, culture results, and LOT  Georgina Pillion, PharmD, BCPS Clinical Pharmacist Pager: [phone] 07/29/2013 10:51 AM

## 2013-07-29 NOTE — Progress Notes (Addendum)
FMTS Attending  Note: Colleen Outten,MD I  have seen and examined this patient, reviewed their chart. I have discussed this patient with the resident. I agree with the resident's findings, assessment and care plan. Patient had another large bowel movement with blood in stool this afternoon,may benefit from GI assessment.

## 2013-07-29 NOTE — Progress Notes (Signed)
Progress Note from the Palliative Medicine Team at [facility]  Subjective: Ms. Mihalko is much more lethargic today and is only opening her eyes occasionally but is nonverbal. Her daughter Rodell Perna and her niece are at bedside and are tearful. They verbalize that they are understanding of her poor prognosis and Rodell Perna says that she is thinking residential hospice is a good option now. We discussed the possibility of her ammonia levels increasing and also her body being tired and trying to shut down causing her altered mental status. We also discussed the rectal bleeding being a complication related to her cirrhosis. Rodell Perna is mainly concerned now with other family members and their acceptance of Ms. Dieujuste's prognosis and acceptance of hospice and comfort care. However, Rodell Perna said her mother was clear that she would want comfort and she wants to respect her mother's wishes (the niece is very supportive). The decision was made to move forward with a referral to residential hospice. We decided to discontinue po medications but Rodell Perna wishes to continue IV antibiotics while she remains in the hospital - but understands and agrees to these being discontinued when she enters hospice care. However, she did decide that if Ms. Bremner's IV is lost we will not place another IV to continue antibiotics at that time. She also decided on no more sticks or blood draws. [name] understands that we will be watching Ms. Chivers and treating her symptoms and comfort instead of labs/vital signs/numbers. We will continue to follow and support holistically.    Objective: No Known Allergies Scheduled Meds: . calcium carbonate  1 tablet Oral Daily  . fluticasone  2 spray Each Nare Daily  . furosemide  20 mg Oral Daily  . lactulose  20 g Oral Daily  . pantoprazole (PROTONIX) IV  40 mg Intravenous Q12H  . rifaximin  550 mg Oral BID  . sodium chloride  3 mL Intravenous Q12H  . spironolactone  50 mg Oral Daily  .  vancomycin  1,500 mg Intravenous Q12H   Continuous Infusions:  PRN Meds:.albuterol, dextrose, diphenhydrAMINE, ondansetron (ZOFRAN) IV, ondansetron, oxyCODONE  BP 102/46  Pulse 115  Temp(Src) 98.2 F (36.8 C) (Axillary)  Resp 20  Ht 5\' 8"  (1.727 m)  Wt 146.8 kg (323 lb 10.2 oz)  BMI 49.22 kg/m2  SpO2 100%   PPS: 20% at best     Intake/Output Summary (Last 24 hours) at 07/29/13 1604 Last data filed at 07/29/13 1100  Gross per 24 hour  Intake    975 ml  Output      4 ml  Net    971 ml      LBM: 07/29/13      Physical Exam:  General: NAD, ill appearing, obese HEENT: Moist mucous membranes, eyes jaundiced Chest: breathes symmetrical and unlabored CVS: RRR Abdomen: Obese, soft Ext: BLE 2+ edema Neuro: Opens eyes to voice  Labs: CBC    Component Value Date/Time   WBC 18.8* 07/29/2013 1040   RBC 2.63* 07/29/2013 1040   RBC 3.28* 04/07/2013 2025   HGB 7.5* 07/29/2013 1040   HCT 21.7* 07/29/2013 1040   PLT 135* 07/29/2013 1040   MCV 82.5 07/29/2013 1040   MCH 28.5 07/29/2013 1040   MCHC 34.6 07/29/2013 1040   RDW 18.6* 07/29/2013 1040   LYMPHSABS 2.2 04/07/2013 1542   MONOABS 0.7 04/07/2013 1542   EOSABS 0.1 04/07/2013 1542   BASOSABS 0.1 04/07/2013 1542    BMET    Component Value Date/Time   NA 135 07/29/2013 1040  K 4.2 07/29/2013 1040   CL 106 07/29/2013 1040   CO2 21 07/29/2013 1040   GLUCOSE 98 07/29/2013 1040   BUN 20 07/29/2013 1040   CREATININE 0.92 07/29/2013 1040   CREATININE 1.38* 07/01/2013 1515   CALCIUM 7.9* 07/29/2013 1040   GFRNONAA 69* 07/29/2013 1040   GFRAA 80* 07/29/2013 1040    CMP     Component Value Date/Time   NA 135 07/29/2013 1040   K 4.2 07/29/2013 1040   CL 106 07/29/2013 1040   CO2 21 07/29/2013 1040   GLUCOSE 98 07/29/2013 1040   BUN 20 07/29/2013 1040   CREATININE 0.92 07/29/2013 1040   CREATININE 1.38* 07/01/2013 1515   CALCIUM 7.9* 07/29/2013 1040   PROT 5.6* 07/29/2013 1040   ALBUMIN 1.5* 07/29/2013 1040   AST 39*  07/29/2013 1040   ALT 22 07/29/2013 1040   ALKPHOS 135* 07/29/2013 1040   BILITOT 5.4* 07/29/2013 1040   GFRNONAA 69* 07/29/2013 1040   GFRAA 80* 07/29/2013 1040      Assessment and Plan: 1. Code Status: DNR 2. Symptom Control: 1. Pain: Roxanol prn. 3. Psycho/Social: Support provided to [facility] as she was struggling with the decision for hospice for her mother today. We will continue to support. 4. Disposition: Hopeful for residential hospice.    Time In Time Out Total Time Spent with Patient Total Overall Time  1520 1600     Greater than 50%  of this time was spent counseling and coordinating care related to the above assessment and plan.  Yong Channel, NP Palliative Medicine Team Team Phone # [phone]  Discussed with [facility] Dr.   1

## 2013-07-29 NOTE — Progress Notes (Signed)
Nursing assessment completed. Patient's skin clammy and diaphoretic. Temperature 100. FSBS 126. Patient to receive 2 units PRBC. Spoke to Dr. Michail Jewels continue with transfusion and call with any questions or concerns.

## 2013-07-30 LAB — TYPE AND SCREEN
Unit division: 0
Unit division: 0

## 2013-07-30 LAB — GLUCOSE, CAPILLARY

## 2013-07-30 MED ORDER — MORPHINE SULFATE (CONCENTRATE) 10 MG /0.5 ML PO SOLN
5.0000 mg | ORAL | Status: DC | PRN
  Administered 2013-07-30 – 2013-07-31 (×9): 5 mg via ORAL
  Filled 2013-07-30 (×9): qty 0.5

## 2013-07-30 MED ORDER — MORPHINE SULFATE (CONCENTRATE) 10 MG /0.5 ML PO SOLN
5.0000 mg | Freq: Once | ORAL | Status: AC
  Administered 2013-07-30: 5 mg via ORAL
  Filled 2013-07-30: qty 0.5

## 2013-07-30 NOTE — Clinical Social Work Note (Signed)
CSW spoke with pt's daughter via phone. CSW discussed residential hospice placement options with pt's daughter, but pt's daughter stated that the only facility she would like for her mother is [facility]. CSW attempted to offer [facility] as a back-up option, but pt's daughter insisted that her mother is to stay in [place], [place]. Pt's daughter is aware she may be contacted by [employer], and is agreeable to that. CSW left voicemail with [facility] liaison regarding referral for residential hospice placement. CSW to continue to follow and assist with discharge planning needs.  Darlyn Chamber, LCSWA Clinical Social Worker [phone]

## 2013-07-30 NOTE — Progress Notes (Signed)
Nutrition Brief Note  Chart reviewed. Patient now transitioning to comfort care.  No further nutrition interventions warranted at this time.  Please consult as needed.   Maureen Chatters, RD, LDN Pager #: [phone] After-Hours Pager #: [phone]

## 2013-07-30 NOTE — Progress Notes (Signed)
FMTS Attending  Note: Chuck Caban,MD I  have seen and examined this patient, reviewed their chart. I have discussed this patient with the resident. I agree with the resident's findings, assessment and care plan.  

## 2013-07-30 NOTE — Progress Notes (Signed)
Family Medicine Teaching Service Daily Progress Note Intern Pager: [phone]  Patient name: Colleen Olson Medical record number: [id number] Date of birth: 06/20/1961 Age: 52 y.o. Gender: female  Primary Care Provider: Rodman Pickle, MD Consultants: Nephrology Code Status: DNR/DNI   Pt Overview and Major Events to Date:  07/18/13 - pt admitted with AMS, severe AKI 07/19/13 - blood cx grow GPC start anti-bx, consult renal, plan to attempt LP 12/20 --> 12/21 - CCM Primary 12/22 - Improved mental status, Hgb 7.1--8.4 12/23 - Hgb >> 7.4; not a candidate for GI intervention; Palliative care consulted 12/24 - Palliative Care meeting, pt DNR/DNI 12/25 - Continued drop in platelets (120 to 59 over one week) 12/26 - Addition of Rifaximin for Hepatic Encephalopathy  12/27 - Hgb 6.9, tranfuse 2units PRBC 12/28 - Hgb 8.7; Vanc Day 10; Rectal tube & Ucath d/c'd; Lasix & Spironolactone restarted @ 1/2 Home ; hgb 6.6-> transfusing 2uprbcs 12/29- Palliative consult patient no comfort care and home with hospice once set up   Assessment and Plan: Colleen Olson is a 52 y.o. female presenting with AMS and acute changes in functional status x 4 days, ultimately found to have sepsis from bacteremia (MRSA), Acute Renal Failure, and UTI (E.Coli). PMH is significant for Cirrhosis 2/2 NASH and ETOH abuse (pt DOES NOT HAVE HEP C AS RNA NEG), Asthma, HTN, Borderline DM, HA, Arthritis, Chronic back pain  # Comfort care: family made the decision to place the patient on comfort and pursue hospice options. -morphine concentrate PO 5 mg q3 hr prn for pain -CSW consulted for residential hospice options  ID: # Sepsis, with MRSA Bacteremia with continued worsening leukocytosis # UTI, E.Coli Initially unclear etiology, considered GI source (US revealed minimal ascites), considered pulmonary etiology with suspected atelectasis vs consolidation. No obvious skin lesions or ulcers. Previously received empiric therapy  for possible meningitis, LP unable to be obtained. Confirmed UTI with E.Coli, however not source of bacteremia. - (12/18) Blood culture - positive MRSA - c/s ID, greatly appreciate all recs    - repeat blood cultures (12/20) - NGx5days    - No vital sign instability, lactic acid normal    - CT abdomen with possible pancreatitis, lipase elevated as well    - Vancomycin (Day 12/14); Ampicillin d/c'd - if the patient loses her IV we will not replace this and will discontinue her vanc at that time, otherwise she will complete the course while hospitalized - WBC: remains elevated 20.2 (12/28); Tachycardia but Afeb > 48 hrs  Neuro: # Acute Encephalopathy, in setting of cirrhosis, suspected secondary sepsis with bacteremia / ARF Unclear mental status at baseline, concern for hepatic encephalopathy with ammonia labile (52 >>91 >>52 >> .Marland KitchenMarland Kitchen)  Initial work-up negative with Head CT, considered meningitis however since determined less likely. Overall suspected primary etiology from bacteremia / acute renal failure. - lactulose and rifaximin d/c'd as patient is now comfort care  GI: # Cirrhosis, secondary to chronic alcohol use (continued); acute pancreatitis On admission, reportedly saw Hepatologist about 3 weeks ago. No recent ingestions per daughter. Ammonia 52 in ED, and initially scleral icterus noted on exam. Pt on lasix and spirnolactone at home. No evidence of bruising or purpura on admission. UDS (+opiates only).  - labs stopped sue to comfort care - Patient is not a liver transplant candidate due to continued alcohol use  Heme: # Acute on Chronic Anemia, Microcytic, concern for acute blood loss anemia 2/2 UGI Bleed On admission, reported significant event of bloody production from mouth /  nose. Worsening of chronic microcytic anemia, positive FOBT in Oct 2014 and likely to have upper GI bleed now in setting of increasing INR > 2.0. FOBT (positive 07/19/13) - Continue Protonix 40mg  IV BID -  patient comfort care at this time. Will stop checking CBC daily.  # Hyponatremia Likely due to cirrhosis - lasix and spironolactone d/c'd as patient is comfort care  #Thrombocytopenia - POA & Stable now - Holding anticoagulation or antiplatelet medications - HITT panel pending  - DIC panel: Fibrinogen wnl & peripheral smear neg for schistocytes  - f/up PT/INR (INR 2.43 on 12/26)  # Asthma on O2- On 2LNC at home -cont albuterol prn  -home o2 requirement    FEN/GI: Diet Dys 3 PPx: INR > 2 and platelets 60s, no anticoagulant  Disposition discharge pending set up of home hospice  Subjective: patient with pain over night evidenced by screaming when moved. She had improved pain control after increasing frequency of PO morphine to q3 hours prn.  Objective: Temp:  [97.7 F (36.5 C)-98.4 F (36.9 C)] 98.3 F (36.8 C) (12/30 0530) Pulse Rate:  [107-115] 113 (12/30 0728) Resp:  [20-22] 20 (12/30 0530) BP: (85-130)/(46-90) 128/63 mmHg (12/30 0728) SpO2:  [96 %-100 %] 100 % (12/30 0530) Physical Exam: General: obese, minimally conversive NAD HEENT: mild scleral icterus, MMM Cardiovascular: RRR, no murmurs heard Respiratory: CTA B/L  Abdomen: obese, soft, NTND, +active BS, evidence of bloody diarrhea in the bed Extremities: WWP, b/l LE +2 pitting edema up to mid thigh, with evidence of TKR on left Skin: no evidence of bruising or bleeding Neuro: awake, and alert, oriented to self only  Laboratory:  Recent Labs Lab 07/28/13 0519 07/28/13 2110 07/29/13 1040  WBC 19.1* 20.2* 18.8*  HGB 8.7* 6.6* 7.5*  HCT 24.7* 18.8* 21.7*  PLT 118* 131* 135*    Recent Labs Lab 07/24/13 0523  07/26/13 0440 07/27/13 0652 07/28/13 0519 07/29/13 1040  NA 136  < > 134* 129* 127* 135  K 3.7  < > 3.8 4.3 4.3 4.2  CL 104  < > 103 103 98 106  CO2 25  < > 25 23 22 21   BUN 14  < > 12 10 10 20   CREATININE 0.64  < > 0.63 0.57 0.54 0.92  CALCIUM 8.3*  < > 8.3* 8.1* 8.2* 7.9*  PROT 6.5  --  6.5   --   --  5.6*  BILITOT 4.5*  --  6.2*  --   --  5.4*  ALKPHOS 157*  --  163*  --   --  135*  ALT 47*  --  46*  --   --  22  AST 123*  --  101*  --   --  39*  GLUCOSE 97  < > 103* 87 100* 98  < > = values in this interval not displayed.  C.Diff (negative) Urine Culture (E.Coli 25,000CFU - pan-sensitive)  Blood Cultures (12/18) MRSA+ Repeat Blood Cultures (12/20) NGTD  Imaging/Diagnostic Tests:  12/16 Knee Xray IMPRESSION:  No acute fracture or subluxation. Stable postsurgical changes. Mild  degenerative changes.  12/18 Hip Xray IMPRESSION:  Normal exam.  12/18 Head CT w/o contrast IMPRESSION:  Study within normal limits.  12/18 Chest Xray 1v FINDINGS:  There is no edema or consolidation. Heart is mildly enlarged with  normal pulmonary vascularity. No appreciable adenopathy. There is  persistent mild elevation the right hemidiaphragm.  IMPRESSION:  Cardiac enlargement, stable. No edema or consolidation.  12/20 Repeat Portable CXR  IMPRESSION:  The findings are consistent with atelectasis or developing pneumonia  diffusely on the left and in the right upper lobe. This may reflect  the clinically suspected aspiration.   Glori Luis, MD 07/30/2013, 9:39 AM PGY-2, [facility] FPTS Intern pager: [phone] Amion password: [email or web address], text pages welcome

## 2013-07-31 LAB — HEPARIN INDUCED THROMBOCYTOPENIA PNL: UFH High Dose UFH H: 21 % Release

## 2013-07-31 MED ORDER — MORPHINE SULFATE (CONCENTRATE) 10 MG /0.5 ML PO SOLN: 5.0000 mg | ORAL | Status: AC | PRN

## 2013-07-31 NOTE — Progress Notes (Signed)
FMTS Attending  Note: Broc Caspers,MD I  have seen and examined this patient, reviewed their chart. I have discussed this patient with the resident. I agree with the resident's findings, assessment and care plan.  

## 2013-07-31 NOTE — Consult Note (Signed)
[facility] Liaison: [employer] available for Ms. Menger today. CSW Irving Burton and RN aware. Spoke with daughter Rodell Perna by phone who plans to meet me at 10:30 to complete transfer paper work. Need DC summary faxed to [phone] and RN to call report to [phone]. Need patient to arrive at [facility] by noon for MD to see for symptom management needs. Thank you. Forrestine Him LCSW [phone]

## 2013-07-31 NOTE — Progress Notes (Signed)
Family Medicine Teaching Service Daily Progress Note Intern Pager: [phone]  Patient name: Colleen Olson Medical record number: [id number] Date of birth: July 10, 1961 Age: 52 y.o. Gender: female  Primary Care Provider: Rodman Pickle, MD Consultants: Nephrology Code Status: DNR/DNI   Pt Overview and Major Events to Date:  07/18/13 - pt admitted with AMS, severe AKI 07/19/13 - blood cx grow GPC start anti-bx, consult renal, plan to attempt LP 12/20 --> 12/21 - CCM Primary 12/22 - Improved mental status, Hgb 7.1--8.4 12/23 - Hgb >> 7.4; not a candidate for GI intervention; Palliative care consulted 12/24 - Palliative Care meeting, pt DNR/DNI 12/25 - Continued drop in platelets (120 to 59 over one week) 12/26 - Addition of Rifaximin for Hepatic Encephalopathy  12/27 - Hgb 6.9, tranfuse 2units PRBC 12/28 - Hgb 8.7; Vanc Day 10; Rectal tube & Ucath d/c'd; Lasix & Spironolactone restarted @ 1/2 Home ; hgb 6.6-> transfusing 2uprbcs 12/29 -  Palliative consult patient no comfort care and home with hospice once set up  12/31 - Plan for transfer to [facility]  Assessment and Plan: Colleen Olson is a 52 y.o. female presenting with AMS and acute changes in functional status x 4 days, ultimately found to have sepsis from bacteremia (MRSA), Acute Renal Failure, and UTI (E.Coli). PMH is significant for Cirrhosis 2/2 NASH and ETOH abuse (pt DOES NOT HAVE HEP C AS RNA NEG), Asthma, HTN, Borderline DM, HA, Arthritis, Chronic back pain  # Comfort care: family made the decision to place the patient on comfort and pursue hospice options. -morphine concentrate PO 5 mg q3 hr prn for pain -CSW consulted. Family wants placement at [facility].  -no blood draws  ID: # Sepsis, with MRSA Bacteremia with continued worsening leukocytosis # UTI, E.Coli Initially unclear etiology, considered GI source (US revealed minimal ascites), considered pulmonary etiology with suspected atelectasis vs consolidation.  No obvious skin lesions or ulcers. Previously received empiric therapy for possible meningitis, LP unable to be obtained. Confirmed UTI with E.Coli, however not source of bacteremia. - (12/18) Blood culture - positive MRSA - c/s ID, greatly appreciate all recs    - repeat blood cultures (12/20) - NGx5days    - No vital sign instability, lactic acid normal    - CT abdomen with possible pancreatitis, lipase elevated as well    - Vancomycin (Day 13/14) today. Discussed with daughter Rodell Perna and she is willing to discontinue medication today to allow patient to be transferred [facility] (cannot get PICC or treatment for bacteremia at [facility] - WBC: remains elevated 20.2 (12/28); Tachycardia but Afeb > 48 hrs  -as comfort care now, no plan for echocardiogram  Neuro: # Acute Encephalopathy, in setting of cirrhosis, suspected secondary sepsis with bacteremia / ARF Unclear mental status at baseline, concern for hepatic encephalopathy with ammonia labile (52 >>91 >>52 >> .Marland KitchenMarland Kitchen)  Initial work-up negative with Head CT, considered meningitis however since determined less likely. Overall suspected primary etiology from bacteremia / acute renal failure.  - concerning hepatic encephalopathy, lactulose and rifaximin d/c'd as patient is now comfort care  GI: # Cirrhosis, secondary to chronic alcohol use (continued); acute pancreatitis On admission, reportedly saw Hepatologist about 3 weeks ago. No recent ingestions per daughter. Ammonia 52 in ED, and initially scleral icterus noted on exam. Pt on lasix and spirnolactone at home. No evidence of bruising or purpura on admission. UDS (+opiates only).  - labs stopped due to comfort care - Patient is not a liver transplant candidate due  to continued alcohol use  Heme: # Acute on Chronic Anemia, Microcytic, concern for acute blood loss anemia 2/2 UGI Bleed On admission, reported significant event of bloody production from mouth / nose. Worsening of chronic  microcytic anemia, positive FOBT in Oct 2014 and likely to have upper GI bleed now in setting of increasing INR > 2.0. FOBT (positive 07/19/13) - Continue Protonix 40mg  IV BID - patient comfort care at this time. Will stop checking CBC daily.  # Hyponatremia Likely due to cirrhosis - lasix and spironolactone d/c'd as patient is comfort care  #Thrombocytopenia - POA & Stable now - Holding anticoagulation or antiplatelet medications - HITT panel pending (in process)-likely of no benefit at this point - DIC panel: Fibrinogen wnl & peripheral smear neg for schistocytes  - f/up PT/INR (INR 2.43 on 12/26)  # Asthma on O2- On 2LNC at home -cont albuterol prn  -home o2 requirement    FEN/GI: Diet Dys 3 PPx: INR > 2 and platelets 60s, no anticoagulant  Disposition discharge to [facility] today  Subjective: patient only looks and tracks but does not communicate with me otherwise.   Objective: Temp:  [97.3 F (36.3 C)-98.1 F (36.7 C)] 97.6 F (36.4 C) (12/31 0533) Pulse Rate:  [112-118] 118 (12/31 0533) Resp:  [18-20] 18 (12/31 0533) BP: (97-112)/(48-56) 112/51 mmHg (12/31 0533) SpO2:  [100 %] 100 % (12/31 0533) Physical Exam: General: obese, not conversive, has gown pulled up over stomach HEENT: mild scleral icterus, MMM Cardiovascular: RRR Respiratory: CTA B/L  Abdomen: obese, soft, NTND, +active BS Extremities: WWP, b/l LE +2 pitting edema up to mid thigh Skin: warm, dry  Neuro: awake, and alert, does not speak with me this morning  Laboratory:  Recent Labs Lab 07/28/13 0519 07/28/13 2110 07/29/13 1040  WBC 19.1* 20.2* 18.8*  HGB 8.7* 6.6* 7.5*  HCT 24.7* 18.8* 21.7*  PLT 118* 131* 135*    Recent Labs Lab 07/26/13 0440 07/27/13 0652 07/28/13 0519 07/29/13 1040  NA 134* 129* 127* 135  K 3.8 4.3 4.3 4.2  CL 103 103 98 106  CO2 25 23 22 21   BUN 12 10 10 20   CREATININE 0.63 0.57 0.54 0.92  CALCIUM 8.3* 8.1* 8.2* 7.9*  PROT 6.5  --   --  5.6*  BILITOT 6.2*   --   --  5.4*  ALKPHOS 163*  --   --  135*  ALT 46*  --   --  22  AST 101*  --   --  39*  GLUCOSE 103* 87 100* 98    C.Diff (negative) Urine Culture (E.Coli 25,000CFU - pan-sensitive)  Blood Cultures (12/18) MRSA+ Repeat Blood Cultures (12/20) NGTD  Imaging/Diagnostic Tests:  12/16 Knee Xray IMPRESSION:  No acute fracture or subluxation. Stable postsurgical changes. Mild  degenerative changes.  12/18 Hip Xray IMPRESSION:  Normal exam.  12/18 Head CT w/o contrast IMPRESSION:  Study within normal limits.  12/18 Chest Xray 1v FINDINGS:  There is no edema or consolidation. Heart is mildly enlarged with  normal pulmonary vascularity. No appreciable adenopathy. There is  persistent mild elevation the right hemidiaphragm.  IMPRESSION:  Cardiac enlargement, stable. No edema or consolidation.  12/20 Repeat Portable CXR IMPRESSION:  The findings are consistent with atelectasis or developing pneumonia  diffusely on the left and in the right upper lobe. This may reflect  the clinically suspected aspiration.   Shelva Majestic, MD 07/31/2013, 9:16 AM PGY-3, [facility] Family Medicine FPTS Intern pager: [phone] Amion password:  [email or web address], text pages welcome

## 2013-07-31 NOTE — Discharge Summary (Signed)
FMTS Attending  Note: Zyra Parrillo,MD I  have seen and examined this patient, reviewed their chart. I have discussed this patient with the resident. I agree with the resident's findings, assessment and care plan.  

## 2013-09-01 NOTE — Progress Notes (Signed)
I have reviewed this case with our NP and agree with the Assessment and Plan as stated.  Angelyse Heslin L. Caleesi Kohl, MD MBA The Palliative Medicine Team at [facility] Team Phone: [phone] Pager: [phone]   

## 2013-09-01 DEATH — deceased

## 2013-09-20 ENCOUNTER — Telehealth: Payer: Self-pay | Admitting: Family Medicine

## 2013-09-20 NOTE — Telephone Encounter (Signed)
Daughter dropped off form to be filled out for insurance.  Please call her when completed.

## 2013-09-23 NOTE — Telephone Encounter (Signed)
Placed in MDs box. Fleeger, Jessica Dawn  

## 2013-09-25 NOTE — Telephone Encounter (Signed)
LMOVM for pt to return call .Fleeger, Jessica Dawn  

## 2013-09-25 NOTE — Telephone Encounter (Signed)
I have reviewed this form. Due to the nature of information this form is asking for, I would feel more comfortable if patient were either present for an office visit to complete it OR she sign a release of information to confirm that I am legally allowed to give this info.  Thank you! Amber M. Hairford, M.D.

## 2013-10-08 NOTE — Telephone Encounter (Signed)
Pts dgt came up today and signed a ROI and brought a copy of the death certificate, which indicates that pt passed away on January 2.  Bradly BienenstockKathy Harrelson placed ROI and death certificate in Dr. Alfonzo BeersHairfords box. Hadi Dubin, Maryjo RochesterJessica Dawn

## 2013-10-10 NOTE — Telephone Encounter (Signed)
Form completed and returned to blue team. Thanks, Continental Airlinesmber M. Hairford, M.D.

## 2013-10-10 NOTE — Telephone Encounter (Signed)
Daughter is aware that forms are up front ready for pick up. Jazmin Hartsell,CMA

## 2014-10-10 ENCOUNTER — Other Ambulatory Visit: Payer: Self-pay | Admitting: Family Medicine

## 2015-04-01 IMAGING — CT CT ABD-PELV W/ CM
2 of 5 series · 16 of 46 positions shown, 18 images · IV contrast (CONTRAST)
Comparison: Abdominal ultrasound 04/07/2013

CLINICAL DATA: Painless jaundice, positive hepatitis C antibody
test

CT ABDOMEN AND PELVIS WITH CONTRAST
TECHNIQUE: Multidetector CT imaging of the abdomen and pelvis was
performed following the standard protocol during bolus
administration of intravenous contrast.
Contrast: 100mL OMNIPAQUE IOHEXOL 300 MG/ML  SOLN

[Series 3: routine · axial · 0.79mm/px · z∈[+554,+1014]mm · 13 of 104 slices shown, 15 images]
[im 6/104  soft-tissue]
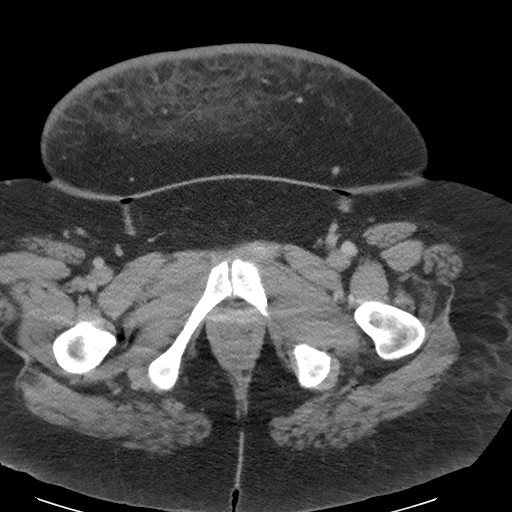
[im 6/104  bone]
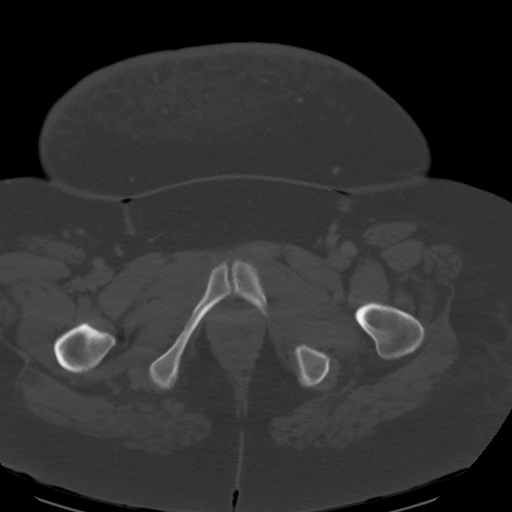
[im 12/104  soft-tissue]
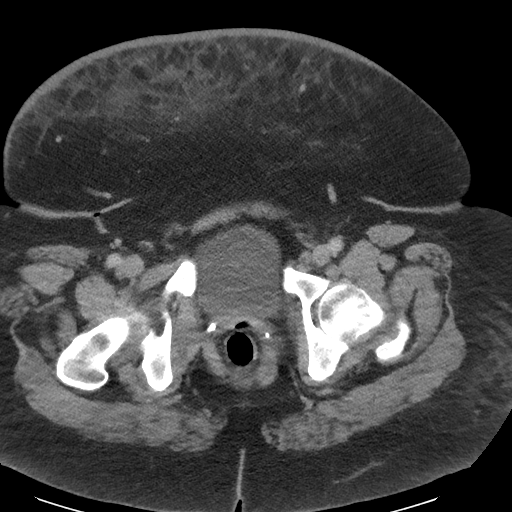
[im 23/104  soft-tissue]
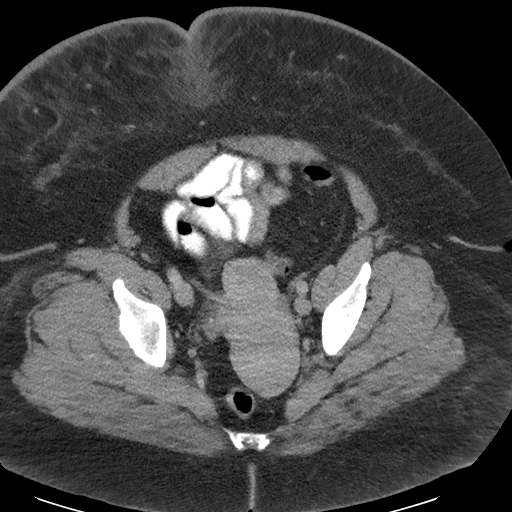
[im 29/104  soft-tissue]
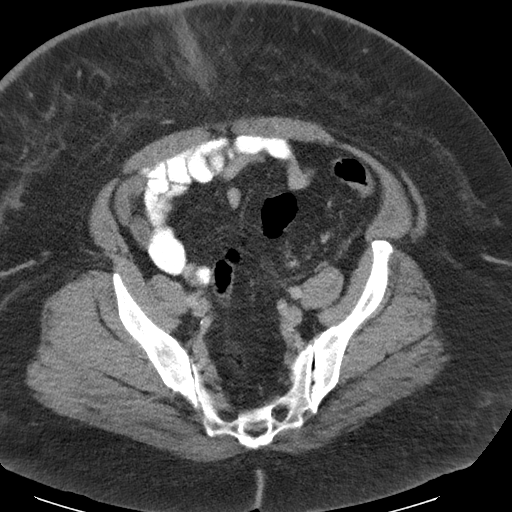
[im 35/104  soft-tissue]
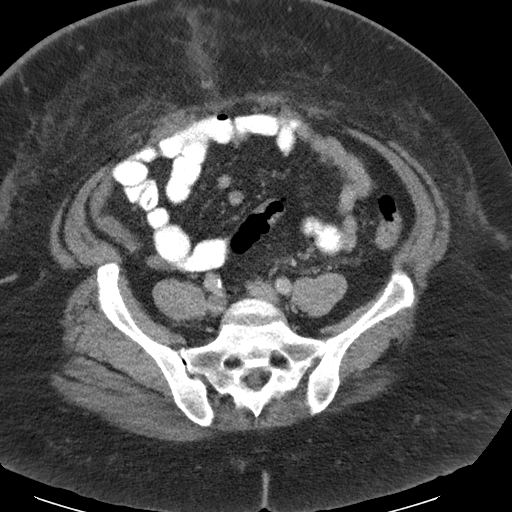
[im 46/104  soft-tissue]
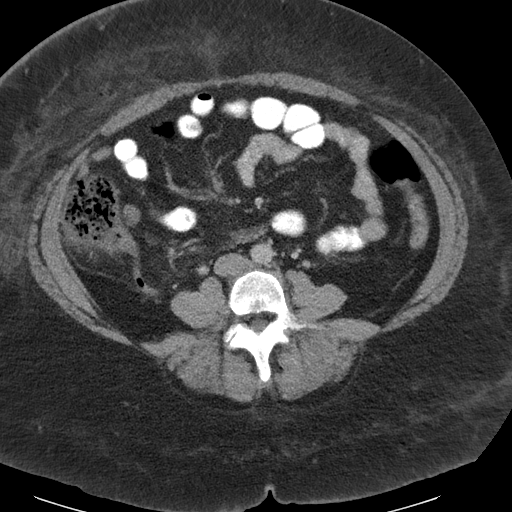
[im 52/104  soft-tissue]
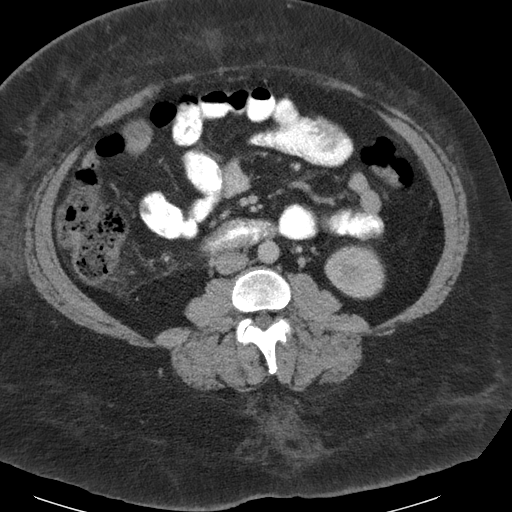
[im 58/104  soft-tissue]
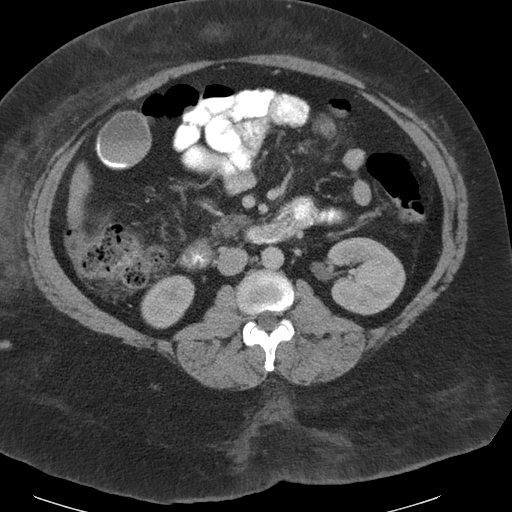
[im 69/104  soft-tissue]
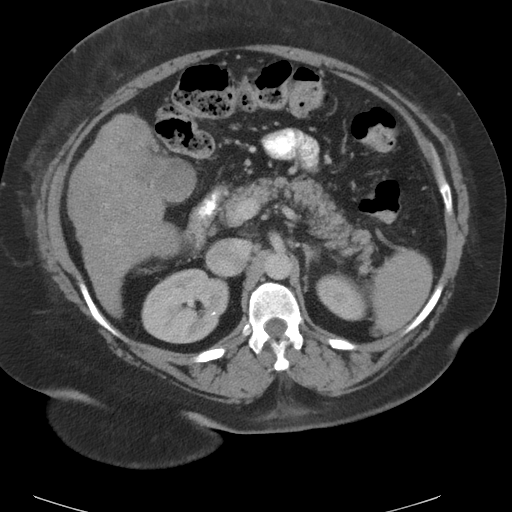
[im 69/104  bone]
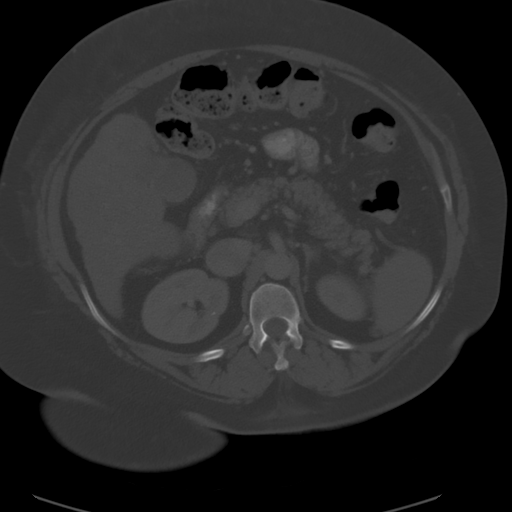
[im 75/104  soft-tissue]
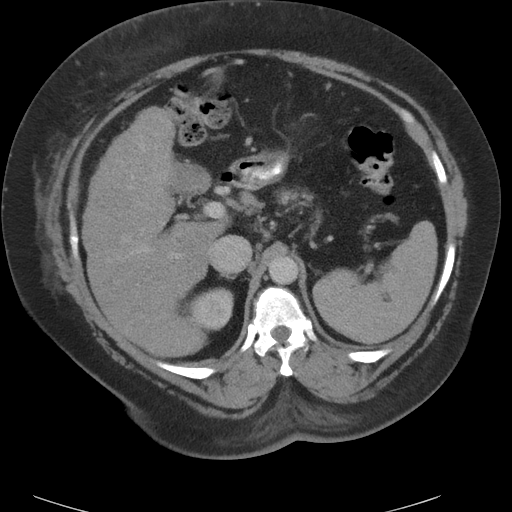
[im 81/104  soft-tissue]
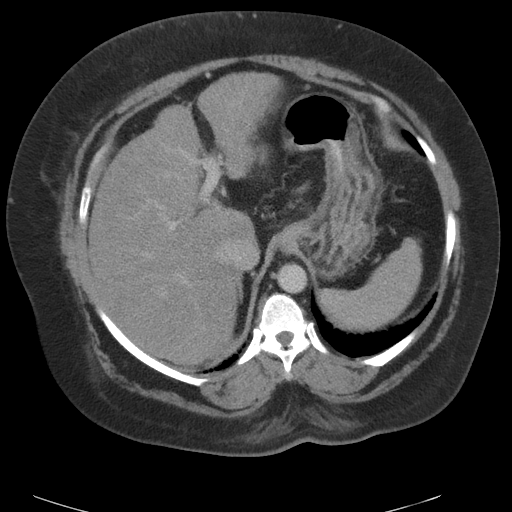
[im 92/104  soft-tissue]
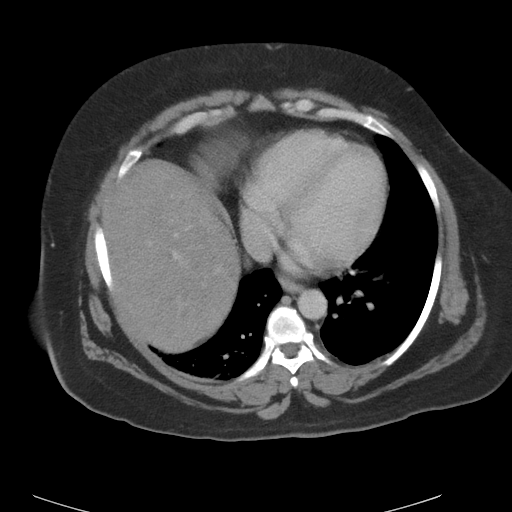
[im 98/104  soft-tissue]
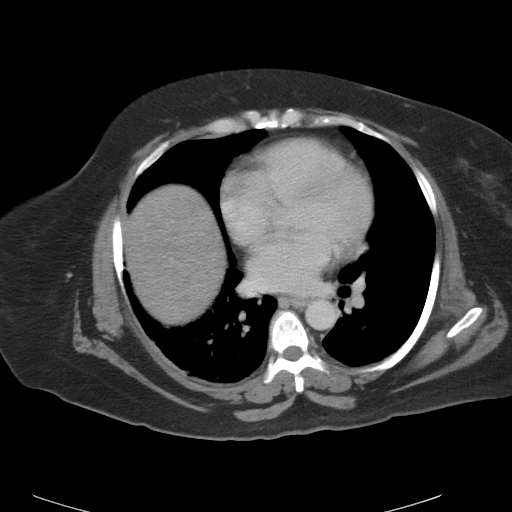

[coronals · coronal · 1.00mm/px · 3 of 128 slices shown]
[im 43/128  soft-tissue]
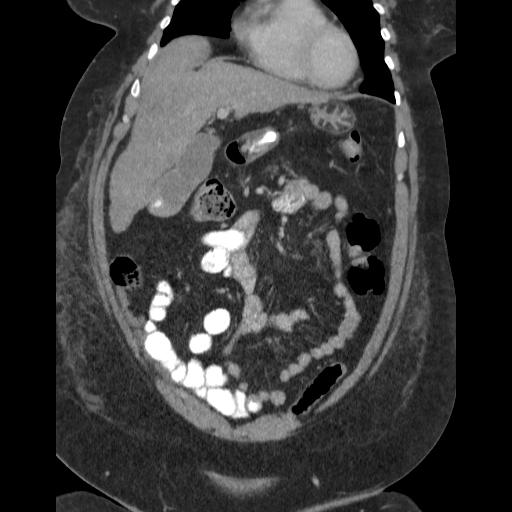
[im 57/128  soft-tissue]
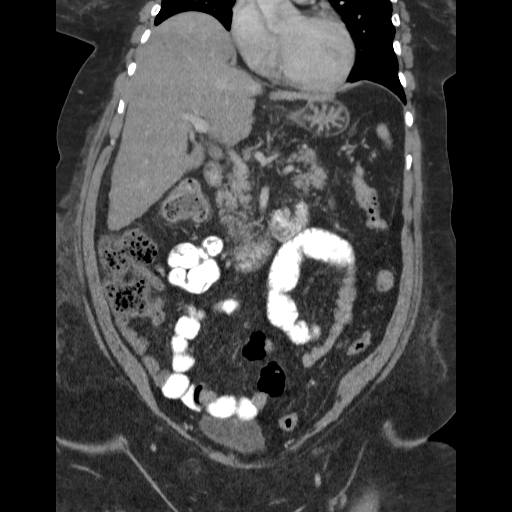
[im 71/128  soft-tissue]
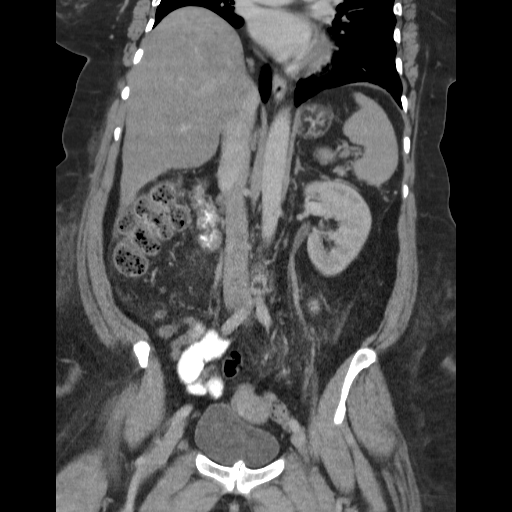

[16 of 46 positions shown; findings below may reference images not displayed]

FINDINGS: Curvilinear right greater than left lower lobe scarring
or atelectasis noted.  No pleural effusion.

There is a mildly lobulated hepatic contour which may suggest
cirrhosis.  1 cm ill-defined hypodense lesions are noted in the
medial segment left hepatic lobe and posterior segment right
hepatic lobe.

Mild diffuse body wall edema is noted.  Trace abdominal ascites is
present.  Dependent hyperdensity within the gallbladder may
indicate vicarious excretion of contrast and or sludge.  There is
mild gallbladder wall thickening at the fundus.  Interdigitation
sinus fat or possibly lymphangioma or cyst at the splenic hilum
image 30.  Adrenal glands, kidneys, and partly fatty infiltrated
pancreas are normal.  Small porta hepatis lymph nodes are
identified.  No lymphadenopathy.

No bowel wall thickening or focal segmental dilatation.  The
appendix is normal.  Uterus has a lobulated contour likely
reflecting underlying fibroids.  Right ovary is normal.  Left ovary
not definitely identified.  No left adnexal mass.  No pelvic
lymphadenopathy.  Bladder is normal.

No acute osseous abnormality.  Degenerative changes are noted in
the lumbar spine.

Nonobstructing 3 mm right upper renal pole calcification image 35.
IMPRESSION: Nodular hepatic contour suggestive of cirrhosis.  Body wall edema
and mild gallbladder wall thickening may be associated with
underlying level with disease and/or hypoproteinemia, but are
otherwise nonspecific.

Hyperdensity within the gallbladder may represent vicarious
excretion of contrast from previous contrast enhanced exam
04/07/2013 or coexisting sludge.  Gallbladder wall calcification is
considered less likely given the configuration.

Ill-defined 1 cm hypodense lesions in the medial segment left
hepatic lobe and posterior segment right hepatic lobe as above.  In
the setting of probable cirrhosis, underlying hepatoma is not
excluded.  As an outpatient, non emergent liver mass protocol
abdominal MRI is recommended for further evaluation and
characterization.
